# Patient Record
Sex: Male | Born: 1960 | Race: White | Hispanic: No | Marital: Married | Smoking: Former smoker
Health system: Southern US, Community
[De-identification: ages and names within clinical notes are randomized; demographics above are authoritative.]

## PROBLEM LIST (undated history)

## (undated) DIAGNOSIS — Z87891 Personal history of nicotine dependence: Secondary | ICD-10-CM

## (undated) DIAGNOSIS — I10 Essential (primary) hypertension: Secondary | ICD-10-CM

## (undated) DIAGNOSIS — I2510 Atherosclerotic heart disease of native coronary artery without angina pectoris: Secondary | ICD-10-CM

## (undated) DIAGNOSIS — B269 Mumps without complication: Secondary | ICD-10-CM

## (undated) DIAGNOSIS — T7840XA Allergy, unspecified, initial encounter: Secondary | ICD-10-CM

## (undated) DIAGNOSIS — E663 Overweight: Secondary | ICD-10-CM

## (undated) DIAGNOSIS — J45909 Unspecified asthma, uncomplicated: Secondary | ICD-10-CM

## (undated) DIAGNOSIS — F419 Anxiety disorder, unspecified: Secondary | ICD-10-CM

## (undated) DIAGNOSIS — U071 COVID-19: Secondary | ICD-10-CM

## (undated) DIAGNOSIS — N200 Calculus of kidney: Secondary | ICD-10-CM

## (undated) DIAGNOSIS — M25562 Pain in left knee: Secondary | ICD-10-CM

## (undated) DIAGNOSIS — B019 Varicella without complication: Secondary | ICD-10-CM

## (undated) DIAGNOSIS — R1310 Dysphagia, unspecified: Secondary | ICD-10-CM

## (undated) DIAGNOSIS — Z0000 Encounter for general adult medical examination without abnormal findings: Secondary | ICD-10-CM

## (undated) DIAGNOSIS — E785 Hyperlipidemia, unspecified: Secondary | ICD-10-CM

## (undated) DIAGNOSIS — F329 Major depressive disorder, single episode, unspecified: Secondary | ICD-10-CM

## (undated) DIAGNOSIS — F5105 Insomnia due to other mental disorder: Secondary | ICD-10-CM

## (undated) DIAGNOSIS — G4700 Insomnia, unspecified: Secondary | ICD-10-CM

## (undated) DIAGNOSIS — B351 Tinea unguium: Secondary | ICD-10-CM

## (undated) DIAGNOSIS — K219 Gastro-esophageal reflux disease without esophagitis: Secondary | ICD-10-CM

## (undated) HISTORY — DX: Gastro-esophageal reflux disease without esophagitis: K21.9

## (undated) HISTORY — DX: Major depressive disorder, single episode, unspecified: F32.9

## (undated) HISTORY — DX: Anxiety disorder, unspecified: F41.9

## (undated) HISTORY — DX: Allergy, unspecified, initial encounter: T78.40XA

## (undated) HISTORY — DX: Unspecified asthma, uncomplicated: J45.909

## (undated) HISTORY — DX: COVID-19: U07.1

## (undated) HISTORY — DX: Insomnia, unspecified: G47.00

## (undated) HISTORY — DX: Essential (primary) hypertension: I10

## (undated) HISTORY — DX: Hyperlipidemia, unspecified: E78.5

## (undated) HISTORY — DX: Atherosclerotic heart disease of native coronary artery without angina pectoris: I25.10

## (undated) HISTORY — PX: COLONOSCOPY: SHX174

## (undated) HISTORY — DX: Dysphagia, unspecified: R13.10

## (undated) HISTORY — DX: Varicella without complication: B01.9

## (undated) HISTORY — DX: Tinea unguium: B35.1

## (undated) HISTORY — DX: Pain in left knee: M25.562

## (undated) HISTORY — DX: Encounter for general adult medical examination without abnormal findings: Z00.00

## (undated) HISTORY — DX: Overweight: E66.3

## (undated) HISTORY — DX: Personal history of nicotine dependence: Z87.891

## (undated) HISTORY — DX: Mumps without complication: B26.9

## (undated) HISTORY — DX: Insomnia due to other mental disorder: F51.05

## (undated) HISTORY — DX: Calculus of kidney: N20.0

---

## 2005-01-14 ENCOUNTER — Inpatient Hospital Stay (HOSPITAL_COMMUNITY): Admission: EM | Admit: 2005-01-14 | Discharge: 2005-01-16 | Payer: Self-pay | Admitting: *Deleted

## 2005-01-14 ENCOUNTER — Ambulatory Visit: Payer: Self-pay | Admitting: Cardiology

## 2005-01-14 HISTORY — PX: CORONARY STENT PLACEMENT: SHX1402

## 2005-01-14 IMAGING — CR DG CHEST 1V PORT
1 series · 1 of 1 positions shown · non-contrast
Comparison: None.

CLINICAL DATA: Chest pain, short of breath. 
 PORTABLE CHEST ? 1 VIEW:

[view not recorded]
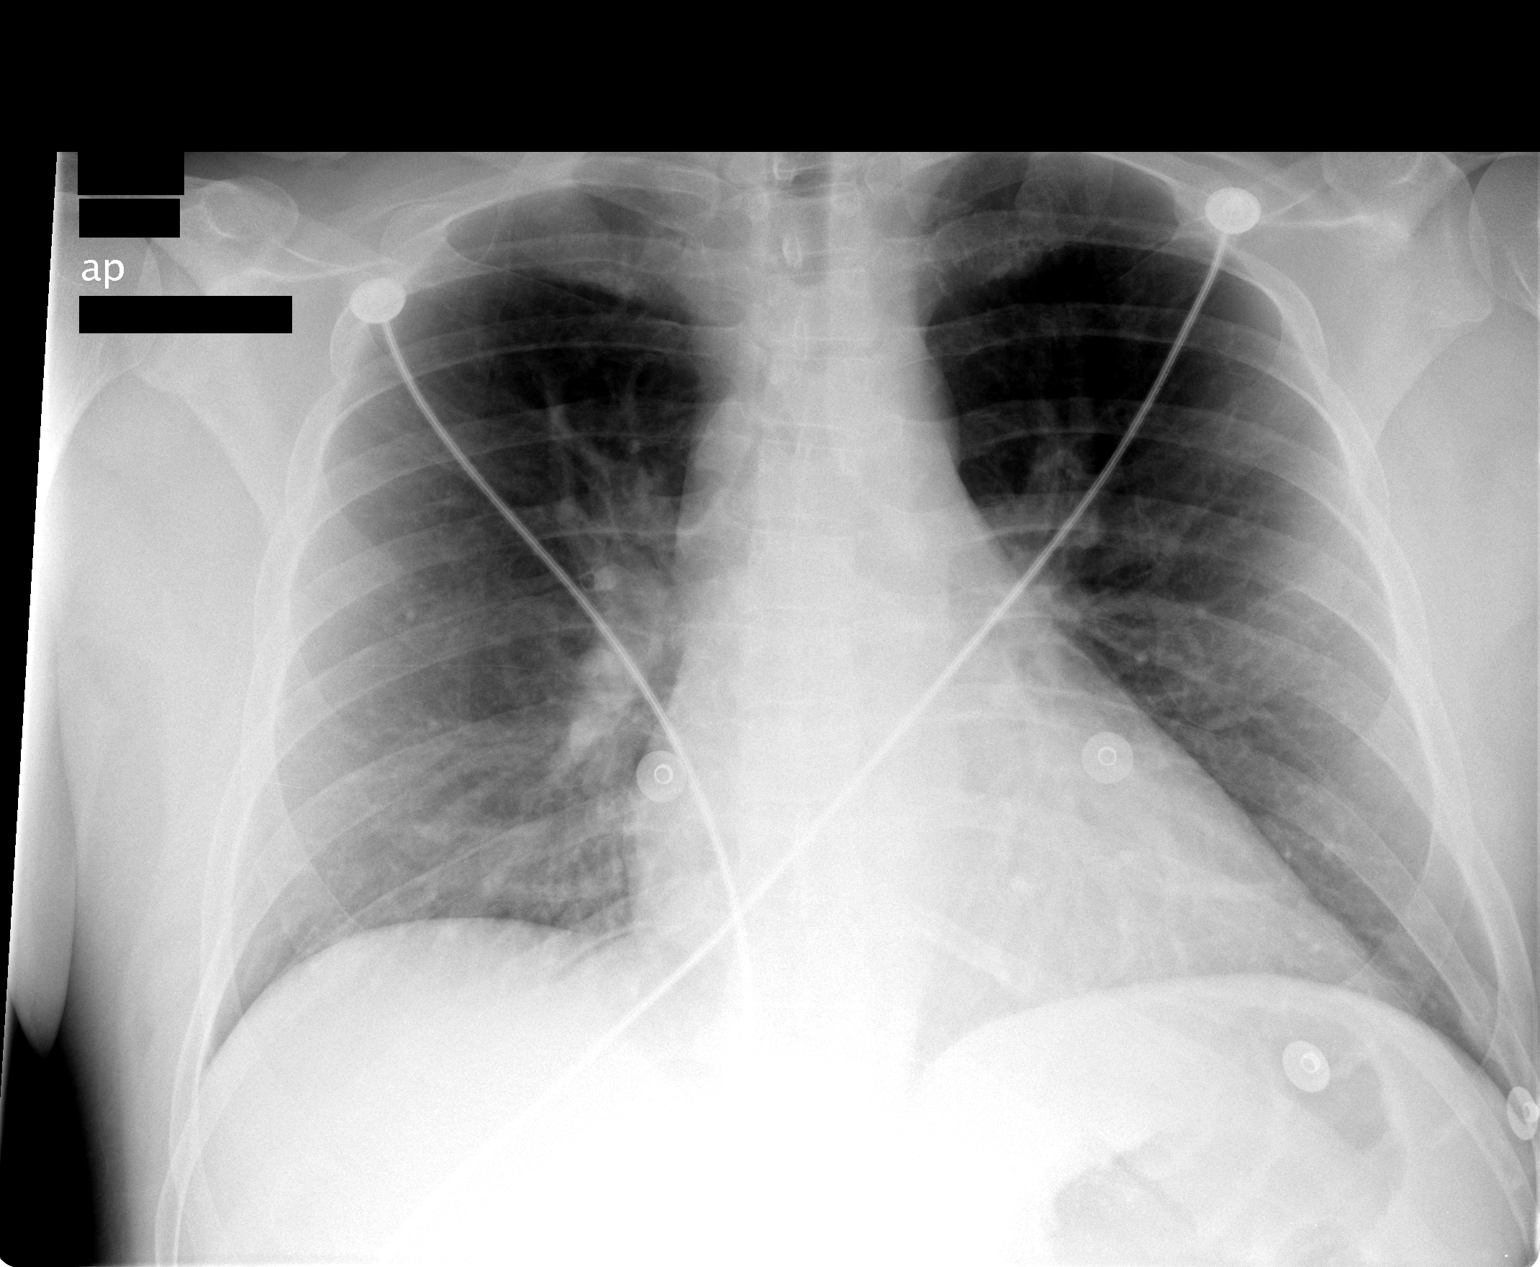

[1 of 1 positions shown; findings below may reference images not displayed]

FINDINGS: Heart within normal limits for a portable chest x-ray.  Question early venous hypertension.  No frank congestive heart failure.  Lungs clear.
IMPRESSION: 1.  Heart upper normal. 
 2.  Question early venous hypertension.

## 2005-01-28 ENCOUNTER — Ambulatory Visit: Payer: Self-pay | Admitting: *Deleted

## 2005-07-17 ENCOUNTER — Ambulatory Visit: Payer: Self-pay | Admitting: Internal Medicine

## 2005-07-20 ENCOUNTER — Ambulatory Visit: Payer: Self-pay | Admitting: Internal Medicine

## 2005-10-26 ENCOUNTER — Ambulatory Visit: Payer: Self-pay | Admitting: Family Medicine

## 2005-10-29 ENCOUNTER — Ambulatory Visit: Payer: Self-pay | Admitting: Family Medicine

## 2005-11-06 ENCOUNTER — Ambulatory Visit: Payer: Self-pay | Admitting: Internal Medicine

## 2006-07-07 ENCOUNTER — Ambulatory Visit: Payer: Self-pay | Admitting: Internal Medicine

## 2006-07-15 ENCOUNTER — Ambulatory Visit: Payer: Self-pay | Admitting: Internal Medicine

## 2006-07-15 LAB — CONVERTED CEMR LAB
AST: 26 units/L (ref 0–37)
Alkaline Phosphatase: 74 units/L (ref 39–117)
CO2: 29 meq/L (ref 19–32)
Chloride: 107 meq/L (ref 96–112)
Creatinine, Ser: 0.9 mg/dL (ref 0.4–1.5)
HDL: 29.5 mg/dL — ABNORMAL LOW (ref >39.0)
Potassium: 4.1 meq/L (ref 3.5–5.1)
Sodium: 141 meq/L (ref 135–145)
Total Bilirubin: 0.8 mg/dL (ref 0.3–1.2)
Total Protein: 6.6 g/dL (ref 6.0–8.3)
Triglycerides: 108 mg/dL (ref 0–149)

## 2007-09-13 ENCOUNTER — Ambulatory Visit: Payer: Self-pay | Admitting: Internal Medicine

## 2007-09-13 LAB — CONVERTED CEMR LAB
Albumin: 4.4 g/dL (ref 3.5–5.2)
Alkaline Phosphatase: 68 units/L (ref 39–117)
BUN: 11 mg/dL (ref 6–23)
CO2: 27 meq/L (ref 19–32)
Chloride: 103 meq/L (ref 96–112)
Cholesterol: 206 mg/dL (ref 0–200)
Creatinine, Ser: 0.9 mg/dL (ref 0.4–1.5)
Direct LDL: 149.4 mg/dL
GFR calc Af Amer: 116 mL/min
GFR calc non Af Amer: 96 mL/min
Glucose, Bld: 99 mg/dL (ref 70–99)
Sodium: 138 meq/L (ref 135–145)
Triglycerides: 177 mg/dL — ABNORMAL HIGH (ref 0–149)

## 2008-11-14 ENCOUNTER — Encounter (INDEPENDENT_AMBULATORY_CARE_PROVIDER_SITE_OTHER): Payer: Self-pay | Admitting: *Deleted

## 2009-07-10 ENCOUNTER — Telehealth: Payer: Self-pay | Admitting: Internal Medicine

## 2009-08-27 DIAGNOSIS — I2510 Atherosclerotic heart disease of native coronary artery without angina pectoris: Secondary | ICD-10-CM | POA: Insufficient documentation

## 2009-08-27 DIAGNOSIS — I10 Essential (primary) hypertension: Secondary | ICD-10-CM

## 2009-08-27 DIAGNOSIS — E782 Mixed hyperlipidemia: Secondary | ICD-10-CM

## 2009-08-28 ENCOUNTER — Ambulatory Visit: Payer: Self-pay | Admitting: Internal Medicine

## 2010-03-04 NOTE — Assessment & Plan Note (Signed)
Summary: REFILL OF MED/MT  Medications Added ASPIRIN 81 MG TABS (ASPIRIN) take one tablet once daily      Allergies Added: NKDA  CC:  pt follow up.  Pt states he feels great and would like to opt out of having an EKG today.  He is self pay and feels he has done well and does not want the added cost.  .  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  He underwent angioplasty of the right coronary arterywith bare-metal stent.  EF was 50%.  He also has history of hypertension, hyperlipidemia, alcohol and tobacco use, depression.   Returns for 2 year f/u.  Says he feel great. 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 No CP or SOB.  Exercises 3-4x week with weightlifting and walking/jogging on trail behind school. Compliant with meds. Still drinking 2-3 large glasses of wine per day. Quit smoking in March.   Current Medications (verified): 1)  Amlodipine Besylate 5 Mg Tabs (Amlodipine Besylate) .... Take One Tablet By Mouth Daily 2)  Lovastatin 40 Mg Tabs (Lovastatin) .... Take One Tablet By Mouth Daily At Bedtime 3)  Aspirin 81 Mg Tabs (Aspirin) .... Take One Tablet Once Daily  Allergies (verified): No Known Drug Allergies  Past History:  Past Medical History: Last updated: 08/27/2009 1. CAD 2. Depression 3. Hyperlipidemia 4. Hypertension  Review of Systems       As per HPI and past medical history; otherwise all systems negative.   Vital Signs:  Patient profile:   50 year old male Height:      73 inches Weight:      206 pounds BMI:     27.28 Pulse rate:   92 / minute Pulse rhythm:   regular BP sitting:   124 / 82  (right arm) Cuff size:   regular  Vitals Entered By: Judithe Modest CMA (August 28, 2009 2:07 PM)  Physical Exam  General:  Gen: well appearing. no resp difficulty HEENT: normal Neck: supple. no JVD. Carotids 2+ bilat; no bruits.  No lymphadenopathy or thryomegaly appreciated. Cor: PMI nondisplaced. Regular rate & rhythm. No rubs, gallops, murmur. Lungs: clear Abdomen: soft, nontender, nondistended. No hepatosplenomegaly. No bruits or masses. Good bowel sounds. Extremities: no cyanosis, clubbing, rash, edema Neuro: alert & orientedx3, cranial nerves grossly intact. moves all 4 extremities w/o difficulty. affect pleasant    Impression & Recommendations:  Problem # 1:  CAD, NATIVE VESSEL (ICD-414.01) Stable. No evidence of ischemia. Continue current regimen.  Problem # 2:  HYPERTENSION, UNSPECIFIED (ICD-401.9) Blood pressure well controlled. Continue current regimen.  Problem # 3:  HYPERLIPIDEMIA-MIXED (ICD-272.4) Goal LDL < 70. Due for f/u lipid and liver panel. Stressed need to be very careful with his alcohol intake in setting of statin.   Problem # 4:  Tobacco use Congratulated him on quitting.   Patient Instructions: 1)  Your physician recommends that you schedule a follow-up appointment in: 24 months with Dr Gala Romney 2)  Your physician recommends that you haver lab work at your primary care MD.  BMP, liver, lipid and CBC  414.01 272.0 401.1 Prescriptions: LOVASTATIN 40 MG TABS (LOVASTATIN) Take one tablet by mouth daily at bedtime  #30 x 11   Entered by:   Charolotte Capuchin, RN   Authorized by:   Dolores Patty, MD, [facility]   Signed by:   Charolotte Capuchin, RN on 08/28/2009  Method used:   Electronically to        [facility] [id number] (retail)       [address]       [place], [place]  [address]       Ph: [phone] or [phone]       Fax: [phone]   RxID:   [id number] AMLODIPINE BESYLATE 5 MG TABS (AMLODIPINE BESYLATE) Take one tablet by mouth daily  #30 Tablet x 11   Entered by:   Charolotte Capuchin, RN   Authorized by:   Dolores Patty, MD, [facility]   Signed by:   Charolotte Capuchin, RN on 08/28/2009   Method used:   Electronically to         [facility] [id number] (retail)       [address]       [place], [place]  [address]       Ph: [phone] or [phone]       Fax: [phone]   RxID:   [id number]

## 2010-03-04 NOTE — Progress Notes (Signed)
Summary: REFILL MEDS   Phone Note Refill Request Call back at Home Phone [phone]   Refills Requested: Medication #1:  AMLODIPINE BESYLATE 5 MG TABS Take one tablet by mouth daily [facility]  Initial call taken by: Glynda Jaeger,  July 10, 2009 11:04 AM Caller: Patient    Prescriptions: AMLODIPINE BESYLATE 5 MG TABS (AMLODIPINE BESYLATE) Take one tablet by mouth daily  #30 Tablet x 2   Entered by:   Hardin Negus, RMA   Authorized by:   Dolores Patty, MD, [facility]   Signed by:   Hardin Negus, RMA on 07/11/2009   Method used:   Electronically to        [facility] [id number] (retail)       [address]       [place], [place]  [address]       Ph: [phone] or [phone]       Fax: [phone]   RxID:   [id number]

## 2010-06-17 NOTE — Assessment & Plan Note (Signed)
[facility]                            CARDIOLOGY OFFICE NOTE   NAME:[name], [name]                       MRN:          [id number]  DATE:09/13/2007                            DOB:          1960-03-17    PRIMARY CARE Dakota [name]:  [facility]  [place]   INTERVAL HISTORY:  Dakota [name] is a 50 year old male with history of coronary  artery disease, status post previous inferior wall myocardial infarction  in December 2006.  He underwent angioplasty of the right coronary artery  with bare-metal stent.  EF was 50%.  He also has history of  hypertension, hyperlipidemia, ongoing alcohol and tobacco use,  depression.   He returns today for yearly followup.  From a cardiac point of view, he  is doing well.  He remains active.  He rides his bike occasionally.  No  chest pain or shortness of breath.  However, he stopped all his  medications as he says they make him feel tired.  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  He is smoking 1-2 packs a day of cigarettes and  has tried everything to quit, but just cannot seem to get it done.  He  is also drinking several glasses of wine a night and/or 3-4 beers is  what he quantifies.   CURRENT MEDICATIONS:  None.   PHYSICAL EXAMINATION:  GENERAL:  He is in no acute distress, ambulatory  on the clinic without any respiratory difficulty.  VITAL SIGNS:  Blood pressure is 134/95, heart rate 83, he weighs 198.  HEENT:  Normal.  NECK:  Supple.  No JVD.  Carotid are 2+ bilaterally without any bruits.  There is no lymphadenopathy or thyromegaly.  CARDIAC:  PMI is nondisplaced.  Regular rate and rhythm with an S4.  No  murmur.  LUNGS:  Clear.  ABDOMEN:  Soft, nontender, and nondistended.  No hepatosplenomegaly, no  bruits, no masses.  Good bowel sounds.  EXTREMITIES:  Warm with no  cyanosis, clubbing, or edema.  No rash.  NEURO:  Alert and  oriented x3.  Cranial nerves II-XII are intact.  Moves  all 4 extremities without difficulty.  Affect is pleasant.   EKG shows sinus rhythm at a rate of 74 with a previous inferior Q waves.   ASSESSMENT AND PLAN:  1. Coronary artery disease.  This is stable.  I have asked him to      resume his aspirin and we will also start him on lovastatin 40.  2. Hypertension.  Blood pressure is elevated.  We will put him on      Norvasc.  He does have a previous history of cocaine use and were      avoiding beta blockers.  We will start Norvasc 5 mg a day today.  3. Hyperlipidemia, as above.  We will start him on lovastatin.  We      will check his lipids today.  I have asked him to follow up with      his  primary care physician.  4. Alcohol and tobacco use.  I have counseled him extensively on the      need to try to find ways to stop.  He really seems to be minimizing      his alcohol use.  I have asked him to follow up with his primary      care physician.   DISPOSITION:  He follow back up with [place] in a year.     Bevelyn Buckles. Bensimhon, MD  Electronically Signed    DRB/MedQ  DD: 09/13/2007  DT: 09/13/2007  Job #: [id number]   cc:   [facility]

## 2010-06-17 NOTE — Assessment & Plan Note (Signed)
[facility]                            CARDIOLOGY OFFICE NOTE   NAME:[name], [name]                       MRN:          [id number]  DATE:07/07/2006                            DOB:          [birth year]    PRIMARY CARE Orel Hord:  [facility] in [place], [place].   INTERVAL HISTORY:  Dakota [name] is a 50 year old male with a history of  coronary artery disease status post inferior wall myocardial infarction  in December 2006, status post PCA and stenting of the right coronary  artery with a bare metal stent. EF is 50%. He also has a history of  hyperlipidemia, ongoing tobacco use, depression, and a history of  polysubstance abuse with ongoing alcohol abuse.   He returns today for routine follow up. From a cardiac point-of-view, he  is doing well. He denies any chest pain or shortness of breath. He has  not had any congestive heart failure symptoms. Unfortunately, he  continues to smoke two packs a day and drink at least a six pack of beer  every other day with a glass or two of wine. He admits to being  depressed and said that about a month ago, he thought about killing  himself by running a hose from his exhaust pipe into his car. He says  that most of his problems extend from having problems with his ex-wife  and other women.   CURRENT MEDICATIONS:  1. Lipitor 80 mg daily.  2. He has stopped his aspirin, Plavix, Cardizem, and Zoloft.   PHYSICAL EXAMINATION:  GENERAL:  He is somewhat jittery, but in no acute  distress. He ambulates around the clinic without any respiratory  difficulty.  VITAL SIGNS:  Blood pressure 144/90, heart rate 81, weight 197.  HEENT:  Normal.  NECK:  Supple. There is no JVD. Carotids are 2+ bilaterally without any  bruits. There is no lymphadenopathy or thyromegaly.  CARDIAC:  PMI is non-displaced. He has a regular rate and rhythm with no  murmurs, rubs, or gallops.  LUNGS:  Clear.  ABDOMEN:  Soft and nontender,  nondistended, no hepatosplenomegaly, no  bruits, no masses.  EXTREMITIES:  Warm with no cyanosis, clubbing, or edema. He does have a  mild rash on his legs and trunk which he attributes to sun exposure.  NEUROLOGIC:  His affect is mildly jittery. Cranial nerves II-XII are  intact. He can use all four extremities without difficulty. He is awake,  alert, and oriented x3.   EKG shows normal sinus rhythm with sinus arrhythmia and left atrial  enlargement at a rate of 81. Small inferior Q-waves. No active STT wave  abnormalities.   ASSESSMENT AND PLAN:  1. Coronary artery disease. This is stable. I have asked him to resume      his aspirin.  2. Hypertension. Blood pressure is elevated. We will restart his      Cardizem. We have been avoiding beta blocker due to his previous      use of cocaine, which he has reportedly has stopped.  3. Hyperlipidemia. He is on Lipitor, however given his  alcohol use, I      am slightly nervous about this. We will have to follow his lipids      and liver panel relatively closely.  4. Depression. I have talked to him about this. He has no active      suicidal or homicidal ideation. We have asked him to follow up in      the [facility] and we have made a referral to them.  5. Alcohol and tobacco use. I counseled him extensively on the need to      find help and stop these behaviors. Hopefully, the [facility] can help him. He is considering hypnosis for his      tobacco use.   DISPOSITION:  We will see him back in clinic in six months to a year for  a routine follow up.     Bevelyn Buckles. Bensimhon, MD  Electronically Signed    DRB/MedQ  DD: 07/07/2006  DT: 07/08/2006  Job #: [id number]

## 2010-06-20 NOTE — Cardiovascular Report (Signed)
NAMERehman, Dakota [name]                ACCOUNT NO.:  [id number]   MEDICAL RECORD NO.:  [id number]          PATIENT TYPE:  INP   LOCATION:  3303                         FACILITY:  [facility]   PHYSICIAN:  Arturo Morton. Riley Kill, M.D. [facility] OF BIRTH:  [birth year]   DATE OF PROCEDURE:  01/14/2005  DATE OF DISCHARGE:                              CARDIAC CATHETERIZATION   INDICATIONS:  The patient is a 50 year old who had onset of chest pain about  eight days ago. He has had persistent chest pain. He was brought to the  catheterization laboratory and underwent diagnostic catheterization by Dr.  Eden Emms. The principal finding was that of a total occlusion of the right  coronary artery with some collateralization. He also had about 40-50% area  of ostial narrowing of the circumflex. There was collateralization of the  distal right coronary with latent collaterals to this area and the patient  had continued ongoing chest pain. As a result, the feeling was that a RCA  should be opened. The patient was on the table in the plans were made to  proceed with an attempt at percutaneous intervention.   Heparin was given according to protocol. ACT was checked and the ACT was  over 200 seconds. Subsequently, additional heparin was administered as well  as double bolus Integrilin. We used a high-torque floppy wire and eventually  with a balloon we were able to get the wire down into the distal vessel.  Reperfusion was achieved. We eventually placed a 20 x 2.75 Liberte stent in  the midportion of the right coronary artery just prior to the acute marginal  branch. The stent was then post dilated using a 3.25 PowerSail balloon.  There was removal of the balloon some retrograde evidence of clot formation  which we attempted to remove and we were successfully able to remove with  the export catheter. There was some distal embolization that occurred into  the distal-most aspect of the posterolateral branch, but without  clinical  consequence, pain, or change in the ST segments. It was in the distal-most  aspect of the posterolateral branch. We tried to put a wire across this area  with some wire thrombolysis. We also gave some intracoronary verapamil.  Since it involved just the distal most aspect of the vessel, we did not  place a balloon out into this area because of its distal location. The  procedure was completed and the femoral sheath sewn into place. He was taken  to the holding area in satisfactory clinical condition.   ANGIOGRAPHIC DATA:  The right coronary is large-caliber vessel. It is  totally occluded just beyond the takeoff of the initial RV branch. Following  reperfusion, the lesion is just prior to the large acute marginal. This area  stented and reduced from 100% down to less than 10% residual narrowing. TIMI  flow is 0 at the beginning of the procedure and III at the completion. There  is excellent runoff into the large acute marginal. There is small PDA system  which then bifurcates into two posterolateral branches. There is evidence of  some distal embolization of  the distal-most factor aspect of the  posterolateral branch, but only the second branch. The first branches are  widely patent with good distal runoff.   CONCLUSION:  Successful percutaneous stenting of the mid-right coronary  artery in the setting of recent acute myocardial infarction.   DISPOSITION:  The patient be treated aspirin and Plavix. He will likely need  beta blockade. We will do a 2-D echocardiogram in the morning.      Arturo Morton. Riley Kill, M.D. [facility]  Electronically Signed     TDS/MEDQ  D:  01/14/2005  T:  01/15/2005  Job:  [id number]   cc:   Arvilla Meres, M.D. [facility]  [facility]  [address]  [place]  [place] [address]   CV Laboratory   Patient's medical record

## 2010-06-20 NOTE — Discharge Summary (Signed)
Dakota [name], Dakota [name]                ACCOUNT NO.:  [id number]   MEDICAL RECORD NO.:  [id number]          PATIENT TYPE:  INP   LOCATION:  3709                         FACILITY:  [facility]   PHYSICIAN:  Arvilla Meres, M.D. LHCDATE OF BIRTH:  [birth year]   DATE OF ADMISSION:  01/14/2005  DATE OF DISCHARGE:  01/16/2005                                 DISCHARGE SUMMARY   PRINCIPAL DIAGNOSIS:  Inferior myocardial infarction.   OTHER DIAGNOSES:  1.  Coronary artery disease.  2.  Polysubstance abuse including tobacco, alcohol and cocaine.  3.  Hyperlipidemia.  4.  Depression.  5.  Gastroesophageal reflux disease.  6.  History of cervical spine spurs.   ALLERGIES:  NEOSPORIN causes rash.   PROCEDURES:  Left heart carotid catheterization with PCI and stenting of the  right coronary artery with bare metal stent.   HISTORY OF PRESENT ILLNESS:  This is a 50 year old white male without prior  history of cardiac disease who has history of polysubstance abuse including  tobacco, alcohol and cocaine.  Approximately eight days prior to  presentation to the [facility] ED, he began to experience epigastric and  chest discomfort which was intermittent over the subsequent week.  Two to  three days prior to admission, he had worsening and more frequent chest pain  and, on the evening of January 13, 2005, he had severe constant chest pain  without associated symptoms.  Of note, he used cocaine approximately one  week ago with onset of symptoms.  The patient presented to the [facility] ED  where his EKG showed sinus rhythm with inferior Q's and without ST or T  abnormalities.  He was subsequently admitted to [facility].   HOSPITAL COURSE:  The patient ruled in for MI with a peak CK of 2996, peak  MB of 291.3.  He was taken to the cardiac cath lab on January 14, 2005  which revealed a normal left main, 30% proximal stenosis of the LAD, 60%  ostial lesion in the left circumflex with left  to right collaterals, and a  total occlusion in the mid-RCA.  His EF was 50% with inferior akinesis.  The  patient was having chest pain on the table and his films were reviewed with  Dr. Riley Kill and he underwent successful PTI and stenting of the mid-RCA with  a 2.75 x 20 mm Liberte bare metal stent.  The patient was monitored in  stepdown post procedure and was counseled on the importance of polysubstance  cessation.  The patient feels that he is not addicted to cocaine, using it  socially about once a week and can stop that easily.  He does not feel that  he has a drinking problem and realizes that quitting smoking will be  difficult.  He has been seen by the smoking cessation team and is willing to  accept the prescription for Wellbutrin.  The patient has been transferred  off of telemetry and has been ambulating in the hallways without recurring  chest discomfort or limitations.  He is being discharged home today in  satisfactory condition.  DISCHARGE LABS:  Hemoglobin 13.6, hematocrit 39.4, WBC 9.1, platelets 215,  MCV 89.1.  Sodium 137, potassium 3.4, chloride 104, CO2 29, BUN 12,  creatinine 0.9, glucose 105.  PT 13.2, INR 1, PTT 33, total bilirubin 1,  alkaline phosphatase 65, AST 60, ALT 26, amylase 23, lipase 14, albumin 3.8,  peak CK 2996, peak MB 291.3, total cholesterol 199, triglycerides 177, HDL  35, LDL 129, calcium 8.5, D-dimer was negative.  Free T4 0.92.  Urine drug  screen was positive for cocaine metabolites.   DISPOSITION:  The patient is being discharged home today in good condition.  He has a follow-up with Dr. Gala Romney with nurse practitioner at [facility]  Cardiology [facility] on January 28, 2005 at 12:30 p.m.  He is also asked to  follow up with his primary care physician located at the [facility] within the next 3-4 weeks.   DISCHARGE MEDICATIONS:  1.  Aspirin 81 mg q.d.  2.  Plavix 75 mg q.d.  3.  Diltiazem CD 120 mg q.d.  4.  Lipitor 80 mg  q.h.s.  5.  Nitroglycerin 0.4 mg sublingual p.r.n. chest pain.  6.  Prilosec 20 mg q.d.  7.  Zoloft as previously prescribed.   OUTSTANDING LAB STUDIES:  None.   DURATION DISCHARGE ENCOUNTER:  45 minutes including physician time.      Ok Anis, NP      Arvilla Meres, M.D. [facility]  Electronically Signed    CRB/MEDQ  D:  01/16/2005  T:  01/19/2005  Job:  [id number]   cc:   [facility]

## 2010-06-20 NOTE — Cardiovascular Report (Signed)
NAMEAVIEN, TAHA                ACCOUNT NO.:  [id number]   MEDICAL RECORD NO.:  [id number]          PATIENT TYPE:  INP   LOCATION:  3303                         FACILITY:  [facility]   PHYSICIAN:  Charlton Haws, M.D.     DATE OF BIRTH:  17-Aug-1960   DATE OF PROCEDURE:  01/14/2005  DATE OF DISCHARGE:                              CARDIAC CATHETERIZATION   Coronary arteriography.   INDICATIONS:  Question acute myocardial infarction with eight days chest  pain with EKG changes.   Cine catheterization was done with a 6-French catheter from the right  femoral artery.   There is a question of the ostial circumflex lesion and a JL-5 catheter was  used to try to selectively engage the circumflex.   Left main coronary artery was normal.   Left anterior descending artery had 30% multi-discrete lesion in the  proximal and mid-vessel.   Circumflex coronary artery was nondominant but a large vessel. There was a  60% tubular lesion in the ostium.   There were left-to-right collaterals to the RCA.   Right coronary artery was dominant and was 100% midvessel occlusion after  the takeoff of a medium-sized RV branch.   RAO VENTRICULOGRAPHY:  RAO ventriculography showed inferobasal wall  akinesis. EF was 50%. There was no MR. Aortic pressure was in the 110/87  range, LV pressure was in the 115/26 range.   IMPRESSION:  The patient is still having pain and has collaterals to the  right. I will review the films with Dr. Riley Kill but I suspect he will want  to proceed with angioplasty of native right coronary artery. The patient  will probably need an Adenosine-Myoview 6 to 8 weeks post myocardial  infarction to further assess the circumflex lesion.           ______________________________  Charlton Haws, M.D.     PN/MEDQ  D:  01/14/2005  T:  01/14/2005  Job:  [id number]

## 2010-06-20 NOTE — H&P (Signed)
[name], [name]                ACCOUNT NO.:  [id number]   MEDICAL RECORD NO.:  [id number]          PATIENT TYPE:  EMS   LOCATION:  MAJO                         FACILITY:  [facility]   PHYSICIAN:  Arvilla Meres, M.D. LHCDATE OF BIRTH:  09/23/1960   DATE OF ADMISSION:  01/14/2005  DATE OF DISCHARGE:                                HISTORY & PHYSICAL   PRIMARY CARE PHYSICIAN:  [facility], [place], [place].  He is new to [facility].   CHIEF COMPLAINT:  Chest pain.   HISTORY OF PRESENT ILLNESS:  Mr. Dakota [name] is a 50 year old male without  previous cardiac history.  He has never previously had a cardiac  catheterization.  He does have a history of poly-substance abuse, including  tobacco, alcohol and cocaine use.  He also has borderline hyperlipidemia.  Approximately 10 days ago he began having some stomach pain, for which he  started on Prilosec.  He got some relief with this.  Over the past two to  three days, he has had multiple episodes of chest pain.  Overnight the chest  pain became very severe and constant.  He describes it as a very hard pain  in the middle of his chest.  There are no associated factors.  He denies any  shortness of breath, diaphoresis, nausea, vomiting or radiation.  It is not  worse with exertion.  It is not positional.  It is not worse with food.  He  has not had any bright red blood per rectum or melena.  There has been no  hematemesis.   REVIEW OF SYSTEMS:  Per the HPI and problem list, otherwise all systems  negative.   PAST MEDICAL HISTORY:  1.  Hyperlipidemia, borderline.  2.  Depression.  3.  Cervical spine disease.   CURRENT MEDICATIONS:  1.  Zoloft.  2.  Prilosec.  3.  Aspirin p.r.n.   ALLERGIES:  NEOSPORIN.   SOCIAL HISTORY:  He recently moved from [place] to [place] about six  months ago.  He was a Technical sales engineer.  He lives with his girlfriend.  He has a history of tobacco of one pack per day for  many years.  Alcohol:  Drinks several beers a day, but cannot quantify.  He does have a history of  DWI about 20 years ago.  Endorses cocaine use, but says the last use was  over one week ago.   FAMILY HISTORY:  Mom died in her 40's due to multiple sclerosis.  Father  died in his 93's due to brain cancer.  He has two sisters in their upper  85's, who are alive and well.   PHYSICAL EXAMINATION:  GENERAL:  He has somewhat of an odd affect.  He  appears anxious.  He is complaining of chest pain.  VITAL SIGNS:  His respirations are unlabored.  Blood pressure 125/26, heart  rate 82.  Saturation 99% on room air.  He is afebrile.  HEENT:  Normocephalic and atraumatic.  EOMI.  Sclerae anicteric.  There are  no xanthelasma.  Mucous membranes moist.  NECK:  Supple,  no jugular venous distention.  Carotids are 2+ bilaterally.  No bruits.  No lymphadenopathy or thyromegaly.  HEART:  He has a regular rate and rhythm with no obvious murmurs, rubs or  gallops.  LUNGS:  Clear to auscultation.  CHEST:  There is no pain on palpation of his chest wall.  ABDOMEN:  Soft, nontender, non-distended.  There is no hepatosplenomegaly.  No bruits, no masses.  He has good bowel sounds.  EXTREMITIES:  Warm, with no clubbing, cyanosis or edema.  Femoral pulses 2+  bilaterally.  There is a soft bruit in the right groin.  DP pulses 2+  bilaterally.  NEUROLOGIC:  He is alert and oriented x3, as I said, with an odd affect.  Cranial nerves II-XII  intact.  He moves all four extremities without any  difficulty.   A chest x-ray is pending.   An electrocardiogram shows a normal sinus rhythm with a rate of 83.  There  are no ST-T wave changes.   LABORATORY DATA:  White count 13.6, hemoglobin 13.7, platelets 247.  Sodium  127, potassium 4, BUN 9, creatinine 0.6.  Point of care markers are still  pending.   ASSESSMENT/PLAN:  Chest pain:  This is fairly atypical, but quite severe.  Given his multiple cardiac risk  factors, I do have some concern for an acute  coronary syndrome, though there is no objective evidence of ischemia.  I do  think it is reasonable to proceed with a cardiac catheterization, to clearly  define his coronary anatomy.  Also in the differential diagnosis is aortic  dissection, but his pulses are fairly symmetric.  We have a chest x-ray  pending.  For now we will treat him with nitroglycerin and narcotics, as  well as aspirin.  I have explained the risks and benefits of catheterization  to both him and to his girlfriend.  They have agreed to proceed.  Will check  a drug screen as well as an alcohol level, and he will need delirium tremens  prophylaxis, so he does not withdraw from alcohol.      Arvilla Meres, M.D. [facility]  Electronically Signed     DB/MEDQ  D:  01/14/2005  T:  01/14/2005  Job:  [id number]

## 2010-12-04 ENCOUNTER — Other Ambulatory Visit: Payer: Self-pay | Admitting: Internal Medicine

## 2011-01-07 ENCOUNTER — Telehealth: Payer: Self-pay | Admitting: Internal Medicine

## 2011-01-07 NOTE — Telephone Encounter (Signed)
New Msg: Pt calling to schedule appt with Dr. Gala Romney to get refills. Please return pt call to advice if Dr. Gala Romney wants to see pt in [facility] OFF, [facility], or transfer pt care to another cardiologists.

## 2011-01-08 NOTE — Telephone Encounter (Signed)
Checking with Dr Gala Romney

## 2011-01-20 NOTE — Telephone Encounter (Signed)
Pt scheduled with another cardiologist.

## 2011-01-23 ENCOUNTER — Other Ambulatory Visit: Payer: Self-pay | Admitting: Internal Medicine

## 2011-01-23 ENCOUNTER — Other Ambulatory Visit: Payer: Self-pay

## 2011-01-23 MED ORDER — AMLODIPINE BESYLATE 5 MG PO TABS: 5.0000 mg | ORAL_TABLET | Freq: Every day | ORAL | Status: DC

## 2011-02-11 ENCOUNTER — Other Ambulatory Visit: Payer: Self-pay | Admitting: Cardiology

## 2011-02-11 ENCOUNTER — Encounter: Payer: Self-pay | Admitting: Cardiology

## 2011-02-11 ENCOUNTER — Ambulatory Visit (INDEPENDENT_AMBULATORY_CARE_PROVIDER_SITE_OTHER): Payer: Self-pay | Admitting: Cardiology

## 2011-02-11 VITALS — BP 132/82 | HR 74 | Ht 72.0 in | Wt 201.0 lb

## 2011-02-11 DIAGNOSIS — I2510 Atherosclerotic heart disease of native coronary artery without angina pectoris: Secondary | ICD-10-CM

## 2011-02-11 DIAGNOSIS — F17200 Nicotine dependence, unspecified, uncomplicated: Secondary | ICD-10-CM

## 2011-02-11 DIAGNOSIS — Z720 Tobacco use: Secondary | ICD-10-CM

## 2011-02-11 DIAGNOSIS — Z87891 Personal history of nicotine dependence: Secondary | ICD-10-CM | POA: Insufficient documentation

## 2011-02-11 HISTORY — DX: Personal history of nicotine dependence: Z87.891

## 2011-02-11 LAB — HEPATIC FUNCTION PANEL
Bilirubin, Direct: 0.1 mg/dL (ref 0.0–0.3)
Indirect Bilirubin: 0.5 mg/dL (ref 0.0–0.9)

## 2011-02-11 NOTE — Assessment & Plan Note (Signed)
Blood pressure controlled. Continue present medications. 

## 2011-02-11 NOTE — Progress Notes (Signed)
   HPI: Pleasant male previously followed by Dr Gala Romney for fu of coronary artery disease. 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   Cath revealed normal LM. Left anterior descending artery had 30% lesion in the proximal and mid-vessel. Lcx with 60 % lesion. There were left-to-right collaterals to the RCA. Right coronary artery was dominant and was 100% midvessel occlusion after the takeoff of a medium-sized RV branch; inferobasal wall akinesis. EF was 50%. He underwent angioplasty of the right coronary arterywith bare-metal stent.  EF was 50%.  Last seen in July of 2011. Since then, the patient denies any dyspnea on exertion, orthopnea, PND, pedal edema, palpitations, syncope or chest pain.   Current Outpatient Prescriptions  Medication Sig Dispense Refill  . amLODipine (NORVASC) 5 MG tablet Take 1 tablet (5 mg total) by mouth daily.  30 tablet  3  . aspirin 81 MG tablet Take 160 mg by mouth daily.      Marland Kitchen atorvastatin (LIPITOR) 40 MG tablet Take 40 mg by mouth daily.      . Multiple Vitamin (MULTIVITAMIN) capsule Take 1 capsule by mouth daily.      . varenicline (CHANTIX) 1 MG tablet Take 1 mg by mouth 2 (two) times daily.         Past Medical History  Diagnosis Date  . CAD (coronary artery disease)   . Hypertension   . Hyperlipidemia   . Depression     No past surgical history on file.  History   Social History  . Marital Status: Legally Separated    Spouse Name: N/A    Number of Children: N/A  . Years of Education: N/A   Occupational History  . Not on file.   Social History Main Topics  . Smoking status: Former Games developer  . Smokeless tobacco: Not on file  . Alcohol Use: Not on file  . Drug Use: Not on file  . Sexually Active: Not on file   Other Topics Concern  . Not on file   Social History Narrative  . No narrative on file    ROS: Some foot pain but no fevers or chills, productive cough, hemoptysis, dysphasia, odynophagia, melena,  hematochezia, dysuria, hematuria, rash, seizure activity, orthopnea, PND, pedal edema, claudication. Remaining systems are negative.  Physical Exam: Well-developed well-nourished in no acute distress.  Skin is warm and dry.  HEENT is normal.  Neck is supple. No thyromegaly.  Chest is clear to auscultation with normal expansion.  Cardiovascular exam is regular rate and rhythm.  Abdominal exam nontender or distended. No masses palpated. Extremities show no edema. neuro grossly intact  ECG normal sinus rhythm at a rate of 74. No ST changes.

## 2011-02-11 NOTE — Patient Instructions (Signed)
Your physician wants you to follow-up in: one year You will receive a reminder letter in the mail two months in advance. If you don't receive a letter, please call our office to schedule the follow-up appointment.   Your physician has requested that you have a stress echocardiogram. For further information please visit [email or web address] Please follow instruction sheet as given.

## 2011-02-11 NOTE — Assessment & Plan Note (Signed)
Continue statin. Check lipids and liver. 

## 2011-02-11 NOTE — Assessment & Plan Note (Signed)
Continue aspirin and statin. I will arrange a stress echocardiogram for risk stratification.

## 2011-02-11 NOTE — Assessment & Plan Note (Signed)
Patient counseled on discontinuing. 

## 2011-02-12 LAB — LIPID PANEL
HDL: 39 mg/dL — ABNORMAL LOW (ref >39)
LDL Cholesterol: 145 mg/dL — ABNORMAL HIGH (ref 0–99)
Triglycerides: 132 mg/dL (ref <150)
VLDL: 26 mg/dL (ref 0–40)

## 2011-02-19 ENCOUNTER — Telehealth: Payer: Self-pay | Admitting: *Deleted

## 2011-02-19 DIAGNOSIS — E7800 Pure hypercholesterolemia, unspecified: Secondary | ICD-10-CM

## 2011-02-19 DIAGNOSIS — Z79899 Other long term (current) drug therapy: Secondary | ICD-10-CM

## 2011-02-19 MED ORDER — PRAVASTATIN SODIUM 40 MG PO TABS: 40.0000 mg | ORAL_TABLET | Freq: Every evening | ORAL | Status: DC

## 2011-02-19 NOTE — Telephone Encounter (Signed)
Message copied by Freddi Starr on Thu Feb 19, 2011 12:19 PM ------      Message from: [name]      Created: Thu Feb 12, 2011  5:10 PM       Dc lovastatin and begin pravachol 40 mg po daily      Lipids and liver in six weeks      Dakota [name]

## 2011-02-19 NOTE — Telephone Encounter (Signed)
Spoke with pt, aware of med change. Labs will be checked the end of feb.

## 2011-03-09 ENCOUNTER — Ambulatory Visit (HOSPITAL_COMMUNITY): Payer: Managed Care, Other (non HMO) | Attending: Cardiology | Admitting: Radiology

## 2011-03-09 ENCOUNTER — Other Ambulatory Visit (HOSPITAL_COMMUNITY): Payer: Self-pay | Admitting: Cardiology

## 2011-03-09 DIAGNOSIS — I10 Essential (primary) hypertension: Secondary | ICD-10-CM | POA: Insufficient documentation

## 2011-03-09 DIAGNOSIS — I2510 Atherosclerotic heart disease of native coronary artery without angina pectoris: Secondary | ICD-10-CM | POA: Insufficient documentation

## 2011-03-09 DIAGNOSIS — E785 Hyperlipidemia, unspecified: Secondary | ICD-10-CM | POA: Insufficient documentation

## 2011-03-09 DIAGNOSIS — I252 Old myocardial infarction: Secondary | ICD-10-CM | POA: Insufficient documentation

## 2011-03-09 DIAGNOSIS — R0989 Other specified symptoms and signs involving the circulatory and respiratory systems: Secondary | ICD-10-CM | POA: Insufficient documentation

## 2011-03-09 DIAGNOSIS — R0609 Other forms of dyspnea: Secondary | ICD-10-CM | POA: Insufficient documentation

## 2011-03-09 MED ORDER — PERFLUTREN PROTEIN A MICROSPH IV SUSP
3.0000 mL | Freq: Once | INTRAVENOUS | Status: AC
  Administered 2011-03-09: 3 mL via INTRAVENOUS

## 2011-04-02 ENCOUNTER — Other Ambulatory Visit: Payer: Self-pay

## 2011-05-06 ENCOUNTER — Encounter: Payer: Self-pay | Admitting: *Deleted

## 2011-06-10 ENCOUNTER — Telehealth: Payer: Self-pay | Admitting: Cardiology

## 2011-06-10 NOTE — Telephone Encounter (Signed)
Spoke with pt, lab orders mailed to pt home address.

## 2011-06-10 NOTE — Telephone Encounter (Signed)
Patient called regarding setting up labs in the [facility] office, wanted to speak with you regarding the matter.  He can be reached at [phone]

## 2011-06-12 ENCOUNTER — Other Ambulatory Visit: Payer: Self-pay | Admitting: *Deleted

## 2011-06-12 MED ORDER — AMLODIPINE BESYLATE 5 MG PO TABS: 5.0000 mg | ORAL_TABLET | Freq: Every day | ORAL | Status: DC

## 2011-07-15 ENCOUNTER — Encounter: Payer: Self-pay | Admitting: Cardiology

## 2011-07-30 ENCOUNTER — Telehealth: Payer: Self-pay | Admitting: *Deleted

## 2011-07-30 DIAGNOSIS — I6529 Occlusion and stenosis of unspecified carotid artery: Secondary | ICD-10-CM

## 2011-07-30 NOTE — Telephone Encounter (Signed)
Spoke with pt, we received a fax from [facility], there was faint left carotid calcification on the cervical spine series and a carotid doppler was recommended. Dopplers scheduled

## 2011-08-18 ENCOUNTER — Encounter: Payer: Self-pay | Admitting: Cardiology

## 2011-08-19 ENCOUNTER — Encounter (INDEPENDENT_AMBULATORY_CARE_PROVIDER_SITE_OTHER): Payer: Managed Care, Other (non HMO)

## 2011-08-19 DIAGNOSIS — I6529 Occlusion and stenosis of unspecified carotid artery: Secondary | ICD-10-CM

## 2011-09-03 ENCOUNTER — Telehealth: Payer: Self-pay | Admitting: Cardiology

## 2011-09-03 NOTE — Telephone Encounter (Signed)
Spoke with pt, aware I have not seen labs but will call and get those.

## 2011-09-03 NOTE — Telephone Encounter (Signed)
Pt calling to see if labs from [facility] has been sent here yet from a couple weeks  Ago , would like results

## 2011-09-14 ENCOUNTER — Telehealth: Payer: Self-pay | Admitting: Cardiology

## 2011-09-14 NOTE — Telephone Encounter (Signed)
Spoke with patient and advised labs in system but not reviewed by Dr Jens Som yet.  Faxed patient a copy at his request.  Will forward to Dr Jens Som for review. Patient is aware Dr Jens Som out of the office until next week

## 2011-09-14 NOTE — Telephone Encounter (Signed)
New msg Pt wants to know lab results from July 16 in [place]  by [facility] when he saw Dr Jens Som. He said he still hasnt received results and [facility] is billing him. He is upset. Please call

## 2011-09-14 NOTE — Telephone Encounter (Signed)
LDL higher than goal; if cost not a problem, dc pravachol and begin lipitor 80 mg po daily with lipids and liver in six weeks. Olga Millers

## 2011-09-16 NOTE — Telephone Encounter (Signed)
Continue pravachol Olga Millers

## 2011-09-16 NOTE — Telephone Encounter (Signed)
Spoke with patient regarding changing to Lipitor and he stated he had tried this in the past.  He had problems with muscle pain and asked to be changed to something different.  Pravachol is the 3rd medication he has tried and this not causing any problems.  He stated he didn't remember the other medication.  Patient would like to work harder on diet to try to get it down and recheck in 6 months.  Will forward to Dr Jens Som for review

## 2011-09-16 NOTE — Telephone Encounter (Signed)
Left message to call back  

## 2011-09-18 NOTE — Telephone Encounter (Signed)
F/u  ° °Patient returning nurse call, he can be reached at hm#  °

## 2011-09-18 NOTE — Telephone Encounter (Signed)
Called patient. He is aware that it is OK with Dr.Crenshaw for him to stay on Pravachol. He will increase activity and watch diet more carefully. Will see him in clinic in January 2014 and set up next lab at that visit.

## 2012-04-11 ENCOUNTER — Other Ambulatory Visit: Payer: Self-pay | Admitting: Cardiology

## 2012-05-25 ENCOUNTER — Other Ambulatory Visit: Payer: Self-pay | Admitting: Cardiology

## 2012-07-27 ENCOUNTER — Other Ambulatory Visit: Payer: Self-pay | Admitting: Cardiology

## 2012-10-13 ENCOUNTER — Other Ambulatory Visit: Payer: Self-pay | Admitting: Cardiology

## 2012-11-15 ENCOUNTER — Other Ambulatory Visit: Payer: Self-pay | Admitting: Cardiology

## 2012-12-14 ENCOUNTER — Encounter: Payer: Self-pay | Admitting: Cardiology

## 2012-12-14 ENCOUNTER — Ambulatory Visit (INDEPENDENT_AMBULATORY_CARE_PROVIDER_SITE_OTHER): Payer: Managed Care, Other (non HMO) | Admitting: Cardiology

## 2012-12-14 VITALS — BP 120/80 | HR 77 | Ht 72.0 in | Wt 201.0 lb

## 2012-12-14 DIAGNOSIS — I2510 Atherosclerotic heart disease of native coronary artery without angina pectoris: Secondary | ICD-10-CM

## 2012-12-14 DIAGNOSIS — E785 Hyperlipidemia, unspecified: Secondary | ICD-10-CM

## 2012-12-14 MED ORDER — PRAVASTATIN SODIUM 80 MG PO TABS: ORAL_TABLET | ORAL | Status: DC

## 2012-12-14 MED ORDER — AMLODIPINE BESYLATE 5 MG PO TABS: 5.0000 mg | ORAL_TABLET | Freq: Every day | ORAL | Status: DC

## 2012-12-14 NOTE — Progress Notes (Signed)
      HPI: FU coronary artery disease. 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 Cath revealed normal LM. Left anterior descending artery had 30% lesion in the proximal and mid-vessel. Lcx with 60 % lesion. There were left-to-right collaterals to the RCA. Right coronary artery was dominant and was 100% midvessel occlusion after the takeoff of a medium-sized RV branch; inferobasal wall akinesis. EF was 50%. He underwent PCI of the right coronary artery with bare-metal stent. EF was 50%. Stress echocardiogram in February 2013 was normal. Carotid Dopplers in July of 2013 showed 40-59% right and 0-39% left stenosis. Followup recommended in one year. Last seen in Jan 2013. Since then, the patient denies any dyspnea on exertion, orthopnea, PND, pedal edema, palpitations, syncope or chest pain.   Current Outpatient Prescriptions  Medication Sig Dispense Refill  . amLODipine (NORVASC) 5 MG tablet TAKE 1 TABLET BY MOUTH EVERY DAY.*NEED OFFICE VISIT FOR MORE REFILL*  30 tablet  0  . aspirin 81 MG tablet Take 160 mg by mouth daily.      . Multiple Vitamin (MULTIVITAMIN) capsule Take 1 capsule by mouth daily.      . pravastatin (PRAVACHOL) 40 MG tablet TAKE ONE TABLET BY MOUTH IN THE EVENING  30 tablet  0   No current facility-administered medications for this visit.     Past Medical History  Diagnosis Date  . CAD (coronary artery disease)   . Hypertension   . Hyperlipidemia   . Depression     History reviewed. No pertinent past surgical history.  History   Social History  . Marital Status: Legally Separated    Spouse Name: N/A    Number of Children: N/A  . Years of Education: N/A   Occupational History  . Not on file.   Social History Main Topics  . Smoking status: Former Games developer  . Smokeless tobacco: Not on file  . Alcohol Use: Not on file  . Drug Use: Not on file  . Sexual Activity: Not on file   Other Topics Concern  . Not on file   Social History  Narrative  . No narrative on file    ROS: no fevers or chills, productive cough, hemoptysis, dysphasia, odynophagia, melena, hematochezia, dysuria, hematuria, rash, seizure activity, orthopnea, PND, pedal edema, claudication. Remaining systems are negative.  Physical Exam: Well-developed well-nourished in no acute distress.  Skin is warm and dry.  HEENT is normal.  Neck is supple.  Chest is clear to auscultation with normal expansion.  Cardiovascular exam is regular rate and rhythm.  Abdominal exam nontender or distended. No masses palpated. Extremities show no edema. neuro grossly intact  ECG sinus rhythm at a rate of 77. No ST changes.

## 2012-12-14 NOTE — Assessment & Plan Note (Signed)
Continue aspirin and statin. 

## 2012-12-14 NOTE — Assessment & Plan Note (Signed)
Recent LDL 122. He has had intolerance to Lipitor previously; cannot afford crestor. Increase Pravachol to 80 mg daily. Check lipids and liver in 6 weeks.

## 2012-12-14 NOTE — Assessment & Plan Note (Signed)
Patient counseled on discontinuing. 

## 2012-12-14 NOTE — Assessment & Plan Note (Signed)
Blood pressure controlled. Continue present medications. Patient has requested a primary care physician and I asked him to schedule an appointment with Dr. Abner Greenspan.

## 2012-12-14 NOTE — Patient Instructions (Signed)
Your physician has recommended you make the following change in your medication:  INCREASE PRAVACHOL ( 80 MG ) DAILY  SCRIPT WAS SENT IN TODAY TO YOUR PHARMACY ALSO, I SENT IN AMLODIPINE   Your physician recommends that you return for lab work ON Monday December 22 AFTER YOUR CAROTID TEST   Your physician has requested that you have a carotid duplex. This test is an ultrasound of the carotid arteries in your neck. It looks at blood flow through these arteries that supply the brain with blood. Allow one hour for this exam. There are no restrictions or special instructions.YOU ARE SCHEDULED ON Monday, December 22 @ 11:00 AM  You have been referred to DR. BLYTH  SOMEONE WILL CALL FROM OUR OFFICE TO SCHEDULE YOU AN APPOINMENT

## 2012-12-22 ENCOUNTER — Ambulatory Visit (INDEPENDENT_AMBULATORY_CARE_PROVIDER_SITE_OTHER): Payer: Managed Care, Other (non HMO) | Admitting: Family Medicine

## 2012-12-22 ENCOUNTER — Encounter: Payer: Self-pay | Admitting: Family Medicine

## 2012-12-22 VITALS — BP 110/74 | HR 82 | Temp 98.0°F | Ht 72.0 in | Wt 203.1 lb

## 2012-12-22 DIAGNOSIS — M25569 Pain in unspecified knee: Secondary | ICD-10-CM

## 2012-12-22 DIAGNOSIS — Z5189 Encounter for other specified aftercare: Secondary | ICD-10-CM

## 2012-12-22 DIAGNOSIS — J45909 Unspecified asthma, uncomplicated: Secondary | ICD-10-CM | POA: Insufficient documentation

## 2012-12-22 DIAGNOSIS — I2510 Atherosclerotic heart disease of native coronary artery without angina pectoris: Secondary | ICD-10-CM

## 2012-12-22 DIAGNOSIS — Z720 Tobacco use: Secondary | ICD-10-CM

## 2012-12-22 DIAGNOSIS — T7840XD Allergy, unspecified, subsequent encounter: Secondary | ICD-10-CM

## 2012-12-22 DIAGNOSIS — I10 Essential (primary) hypertension: Secondary | ICD-10-CM

## 2012-12-22 DIAGNOSIS — M25562 Pain in left knee: Secondary | ICD-10-CM

## 2012-12-22 DIAGNOSIS — T7840XA Allergy, unspecified, initial encounter: Secondary | ICD-10-CM | POA: Insufficient documentation

## 2012-12-22 DIAGNOSIS — F329 Major depressive disorder, single episode, unspecified: Secondary | ICD-10-CM

## 2012-12-22 DIAGNOSIS — E785 Hyperlipidemia, unspecified: Secondary | ICD-10-CM

## 2012-12-22 DIAGNOSIS — G4700 Insomnia, unspecified: Secondary | ICD-10-CM

## 2012-12-22 DIAGNOSIS — F17200 Nicotine dependence, unspecified, uncomplicated: Secondary | ICD-10-CM

## 2012-12-22 MED ORDER — BUPROPION HCL ER (XL) 300 MG PO TB24: 300.0000 mg | ORAL_TABLET | Freq: Every day | ORAL | Status: DC

## 2012-12-22 MED ORDER — ZOLPIDEM TARTRATE 10 MG PO TABS: 10.0000 mg | ORAL_TABLET | Freq: Every evening | ORAL | Status: DC | PRN

## 2012-12-22 MED ORDER — BUPROPION HCL ER (XL) 150 MG PO TB24: 150.0000 mg | ORAL_TABLET | Freq: Every day | ORAL | Status: DC

## 2012-12-22 NOTE — Progress Notes (Signed)
Pre visit review using our clinic review tool, if applicable. No additional management support is needed unless otherwise documented below in the visit note. 

## 2012-12-22 NOTE — Patient Instructions (Signed)
 Preventive Care for Adults, Male A healthy lifestyle and preventive care can promote health and wellness. Preventive health guidelines for men include the following key practices:  A routine yearly physical is a good way to check with your caregiver about your health and preventative screening. It is a chance to share any concerns and updates on your health, and to receive a thorough exam.  Visit your dentist for a routine exam and preventative care every 6 months. Brush your teeth twice a day and floss once a day. Good oral hygiene prevents tooth decay and gum disease.  The frequency of eye exams is based on your age, health, family medical history, use of contact lenses, and other factors. Follow your caregiver's recommendations for frequency of eye exams.  Eat a healthy diet. Foods like vegetables, fruits, whole grains, low-fat dairy products, and lean protein foods contain the nutrients you need without too many calories. Decrease your intake of foods high in solid fats, added sugars, and salt. Eat the right amount of calories for you.Get information about a proper diet from your caregiver, if necessary.  Regular physical exercise is one of the most important things you can do for your health. Most adults should get at least 150 minutes of moderate-intensity exercise (any activity that increases your heart rate and causes you to sweat) each week. In addition, most adults need muscle-strengthening exercises on 2 or more days a week.  Maintain a healthy weight. The body mass index (BMI) is a screening tool to identify possible weight problems. It provides an estimate of body fat based on height and weight. Your caregiver can help determine your BMI, and can help you achieve or maintain a healthy weight.For adults 20 years and older:  A BMI below 18.5 is considered underweight.  A BMI of 18.5 to 24.9 is normal.  A BMI of 25 to 29.9 is considered overweight.  A BMI of 30 and above is  considered obese.  Maintain normal blood lipids and cholesterol levels by exercising and minimizing your intake of saturated fat. Eat a balanced diet with plenty of fruit and vegetables. Blood tests for lipids and cholesterol should begin at age 20 and be repeated every 5 years. If your lipid or cholesterol levels are high, you are over 50, or you are a high risk for heart disease, you may need your cholesterol levels checked more frequently.Ongoing high lipid and cholesterol levels should be treated with medicines if diet and exercise are not effective.  If you smoke, find out from your caregiver how to quit. If you do not use tobacco, do not start.  Lung cancer screening is recommended for adults aged 55 80 years who are at high risk for developing lung cancer because of a history of smoking. Yearly low-dose computed tomography (CT) is recommended for people who have at least a 30-pack-year history of smoking and are a current smoker or have quit within the past 15 years. A pack year of smoking is smoking an average of 1 pack of cigarettes a day for 1 year (for example: 1 pack a day for 30 years or 2 packs a day for 15 years). Yearly screening should continue until the smoker has stopped smoking for at least 15 years. Yearly screening should also be stopped for people who develop a health problem that would prevent them from having lung cancer treatment.  If you choose to drink alcohol, do not exceed 2 drinks per day. One drink is considered to be 12   ounces (355 mL) of beer, 5 ounces (148 mL) of wine, or 1.5 ounces (44 mL) of liquor.  Avoid use of street drugs. Do not share needles with anyone. Ask for help if you need support or instructions about stopping the use of drugs.  High blood pressure causes heart disease and increases the risk of stroke. Your blood pressure should be checked at least every 1 to 2 years. Ongoing high blood pressure should be treated with medicines, if weight loss and  exercise are not effective.  If you are 45 to 52 years old, ask your caregiver if you should take aspirin to prevent heart disease.  Diabetes screening involves taking a blood sample to check your fasting blood sugar level. This should be done once every 3 years, after age 45, if you are within normal weight and without risk factors for diabetes. Testing should be considered at a younger age or be carried out more frequently if you are overweight and have at least 1 risk factor for diabetes.  Colorectal cancer can be detected and often prevented. Most routine colorectal cancer screening begins at the age of 50 and continues through age 75. However, your caregiver may recommend screening at an earlier age if you have risk factors for colon cancer. On a yearly basis, your caregiver may provide home test kits to check for hidden blood in the stool. Use of a small camera at the end of a tube, to directly examine the colon (sigmoidoscopy or colonoscopy), can detect the earliest forms of colorectal cancer. Talk to your caregiver about this at age 50, when routine screening begins. Direct examination of the colon should be repeated every 5 to 10 years through age 75, unless early forms of pre-cancerous polyps or small growths are found.  Hepatitis C blood testing is recommended for all people born from 1945 through 1965 and any individual with known risks for hepatitis C.  Practice safe sex. Use condoms and avoid high-risk sexual practices to reduce the spread of sexually transmitted infections (STIs). STIs include gonorrhea, chlamydia, syphilis, trichomonas, herpes, HPV, and human immunodeficiency virus (HIV). Herpes, HIV, and HPV are viral illnesses that have no cure. They can result in disability, cancer, and death.  A one-time screening for abdominal aortic aneurysm (AAA) and surgical repair of large AAAs by sound wave imaging (ultrasonography) is recommended for ages 65 to 75 years who are current or  former smokers.  Healthy men should no longer receive prostate-specific antigen (PSA) blood tests as part of routine cancer screening. Consult with your caregiver about prostate cancer screening.  Testicular cancer screening is not recommended for adult males who have no symptoms. Screening includes self-exam, caregiver exam, and other screening tests. Consult with your caregiver about any symptoms you have or any concerns you have about testicular cancer.  Use sunscreen. Apply sunscreen liberally and repeatedly throughout the day. You should seek shade when your shadow is shorter than you. Protect yourself by wearing long sleeves, pants, a wide-brimmed hat, and sunglasses year round, whenever you are outdoors.  Once a month, do a whole body skin exam, using a mirror to look at the skin on your back. Notify your caregiver of new moles, moles that have irregular borders, moles that are larger than a pencil eraser, or moles that have changed in shape or color.  Stay current with required immunizations.  Influenza vaccine. All adults should be immunized every year.  Tetanus, diphtheria, and acellular pertussis (Td, Tdap) vaccine. An adult who has not   previously received Tdap or who does not know his vaccine status should receive 1 dose of Tdap. This initial dose should be followed by tetanus and diphtheria toxoids (Td) booster doses every 10 years. Adults with an unknown or incomplete history of completing a 3-dose immunization series with Td-containing vaccines should begin or complete a primary immunization series including a Tdap dose. Adults should receive a Td booster every 10 years.  Varicella vaccine. An adult without evidence of immunity to varicella should receive 2 doses or a second dose if he has previously received 1 dose.  Human papillomavirus (HPV) vaccine. Males aged 13 21 years who have not received the vaccine previously should receive the 3-dose series. Males aged 22 26 years may be  immunized. Immunization is recommended through the age of 26 years for any male who has sex with males and did not get any or all doses earlier. Immunization is recommended for any person with an immunocompromised condition through the age of 26 years if he did not get any or all doses earlier. During the 3-dose series, the second dose should be obtained 4 8 weeks after the first dose. The third dose should be obtained 24 weeks after the first dose and 16 weeks after the second dose.  Zoster vaccine. One dose is recommended for adults aged 60 years or older unless certain conditions are present.  Measles, mumps, and rubella (MMR) vaccine. Adults born before 1957 generally are considered immune to measles and mumps. Adults born in 1957 or later should have 1 or more doses of MMR vaccine unless there is a contraindication to the vaccine or there is laboratory evidence of immunity to each of the three diseases. A routine second dose of MMR vaccine should be obtained at least 28 days after the first dose for students attending postsecondary schools, health care workers, or international travelers. People who received inactivated measles vaccine or an unknown type of measles vaccine during 1963 1967 should receive 2 doses of MMR vaccine. People who received inactivated mumps vaccine or an unknown type of mumps vaccine before 1979 and are at high risk for mumps infection should consider immunization with 2 doses of MMR vaccine. Unvaccinated health care workers born before 1957 who lack laboratory evidence of measles, mumps, or rubella immunity or laboratory confirmation of disease should consider measles and mumps immunization with 2 doses of MMR vaccine or rubella immunization with 1 dose of MMR vaccine.  Pneumococcal 13-valent conjugate (PCV13) vaccine. When indicated, a person who is uncertain of his immunization history and has no record of immunization should receive the PCV13 vaccine. An adult aged 19 years or  older who has certain medical conditions and has not been previously immunized should receive 1 dose of PCV13 vaccine. This PCV13 should be followed with a dose of pneumococcal polysaccharide (PPSV23) vaccine. The PPSV23 vaccine dose should be obtained at least 8 weeks after the dose of PCV13 vaccine. An adult aged 19 years or older who has certain medical conditions and previously received 1 or more doses of PPSV23 vaccine should receive 1 dose of PCV13. The PCV13 vaccine dose should be obtained 1 or more years after the last PPSV23 vaccine dose.  Pneumococcal polysaccharide (PPSV23) vaccine. When PCV13 is also indicated, PCV13 should be obtained first. All adults aged 65 years and older should be immunized. An adult younger than age 65 years who has certain medical conditions should be immunized. Any person who resides in a nursing home or long-term care facility should be   immunized. An adult smoker should be immunized. People with an immunocompromised condition and certain other conditions should receive both PCV13 and PPSV23 vaccines. People with human immunodeficiency virus (HIV) infection should be immunized as soon as possible after diagnosis. Immunization during chemotherapy or radiation therapy should be avoided. Routine use of PPSV23 vaccine is not recommended for American Indians, [place] Natives, or people younger than 65 years unless there are medical conditions that require PPSV23 vaccine. When indicated, people who have unknown immunization and have no record of immunization should receive PPSV23 vaccine. One-time revaccination 5 years after the first dose of PPSV23 is recommended for people aged 19 64 years who have chronic kidney failure, nephrotic syndrome, asplenia, or immunocompromised conditions. People who received 1 2 doses of PPSV23 before age 65 years should receive another dose of PPSV23 vaccine at age 65 years or later if at least 5 years have passed since the previous dose. Doses of  PPSV23 are not needed for people immunized with PPSV23 at or after age 65 years.  Meningococcal vaccine. Adults with asplenia or persistent complement component deficiencies should receive 2 doses of quadrivalent meningococcal conjugate (MenACWY-D) vaccine. The doses should be obtained at least 2 months apart. Microbiologists working with certain meningococcal bacteria, military recruits, people at risk during an outbreak, and people who travel to or live in countries with a high rate of meningitis should be immunized. A first-year college student up through age 21 years who is living in a residence hall should receive a dose if he did not receive a dose on or after his 16th birthday. Adults who have certain high-risk conditions should receive one or more doses of vaccine.  Hepatitis A vaccine. Adults who wish to be protected from this disease, have certain high-risk conditions, work with hepatitis A-infected animals, work in hepatitis A research labs, or travel to or work in countries with a high rate of hepatitis A should be immunized. Adults who were previously unvaccinated and who anticipate close contact with an international adoptee during the first 60 days after arrival in the [place] from a country with a high rate of hepatitis A should be immunized.  Hepatitis B vaccine. Adults who wish to be protected from this disease, have certain high-risk conditions, may be exposed to blood or other infectious body fluids, are household contacts or sex partners of hepatitis B positive people, are clients or workers in certain care facilities, or travel to or work in countries with a high rate of hepatitis B should be immunized.  Haemophilus influenzae type b (Hib) vaccine. A previously unvaccinated person with asplenia or sickle cell disease or having a scheduled splenectomy should receive 1 dose of Hib vaccine. Regardless of previous immunization, a recipient of a hematopoietic stem cell transplant  should receive a 3-dose series 6 12 months after his successful transplant. Hib vaccine is not recommended for adults with HIV infection. Preventive Service / Frequency Ages 19 to 39  Blood pressure check.** / Every 1 to 2 years.  Lipid and cholesterol check.** / Every 5 years beginning at age 20.  Hepatitis C blood test.** / For any individual with known risks for hepatitis C.  Skin self-exam. / Monthly.  Influenza vaccine. / Every year.  Tetanus, diphtheria, and acellular pertussis (Tdap, Td) vaccine.** / Consult your caregiver. 1 dose of Td every 10 years.  Varicella vaccine.** / Consult your caregiver.  HPV vaccine. / 3 doses over 6 months, if 26 and younger.  Measles, mumps, rubella (MMR) vaccine.** /   You need at least 1 dose of MMR if you were born in 1957 or later. You may also need a 2nd dose.  Pneumococcal 13-valent conjugate (PCV13) vaccine.** / Consult your caregiver.  Pneumococcal polysaccharide (PPSV23) vaccine.** / 1 to 2 doses if you smoke cigarettes or if you have certain conditions.  Meningococcal vaccine.** / 1 dose if you are age 19 to 21 years and a first-year college student living in a residence hall, or have one of several medical conditions, you need to get vaccinated against meningococcal disease. You may also need additional booster doses.  Hepatitis A vaccine.** / Consult your caregiver.  Hepatitis B vaccine.** / Consult your caregiver.  Haemophilus influenzae type b (Hib) vaccine.** / Consult your caregiver. Ages 40 to 64  Blood pressure check.** / Every 1 to 2 years.  Lipid and cholesterol check.** / Every 5 years beginning at age 20.  Lung cancer screening. / Every year if you are aged 55 80 years and have a 30-pack-year history of smoking and currently smoke or have quit within the past 15 years. Yearly screening is stopped once you have quit smoking for at least 15 years or develop a health problem that would prevent you from having lung cancer  treatment.  Fecal occult blood test (FOBT) of stool. / Every year beginning at age 50 and continuing until age 75. You may not have to do this test if you get colonoscopy every 10 years.  Flexible sigmoidoscopy** or colonoscopy.** / Every 5 years for a flexible sigmoidoscopy or every 10 years for a colonoscopy beginning at age 50 and continuing until age 75.  Hepatitis C blood test.** / For all people born from 1945 through 1965 and any individual with known risks for hepatitis C.  Skin self-exam. / Monthly.  Influenza vaccine. / Every year.  Tetanus, diphtheria, and acellular pertussis (Tdap/Td) vaccine.** / Consult your caregiver. 1 dose of Td every 10 years.  Varicella vaccine.** / Consult your caregiver.  Zoster vaccine.** / 1 dose for adults aged 60 years or older.  Measles, mumps, rubella (MMR) vaccine.** / You need at least 1 dose of MMR if you were born in 1957 or later. You may also need a 2nd dose.  Pneumococcal 13-valent conjugate (PCV13) vaccine.** / Consult your caregiver.  Pneumococcal polysaccharide (PPSV23) vaccine.** / 1 to 2 doses if you smoke cigarettes or if you have certain conditions.  Meningococcal vaccine.** / Consult your caregiver.  Hepatitis A vaccine.** / Consult your caregiver.  Hepatitis B vaccine.** / Consult your caregiver.  Haemophilus influenzae type b (Hib) vaccine.** / Consult your caregiver. Ages 65 and over  Blood pressure check.** / Every 1 to 2 years.  Lipid and cholesterol check.**/ Every 5 years beginning at age 20.  Lung cancer screening. / Every year if you are aged 55 80 years and have a 30-pack-year history of smoking and currently smoke or have quit within the past 15 years. Yearly screening is stopped once you have quit smoking for at least 15 years or develop a health problem that would prevent you from having lung cancer treatment.  Fecal occult blood test (FOBT) of stool. / Every year beginning at age 50 and continuing until  age 75. You may not have to do this test if you get colonoscopy every 10 years.  Flexible sigmoidoscopy** or colonoscopy.** / Every 5 years for a flexible sigmoidoscopy or every 10 years for a colonoscopy beginning at age 50 and continuing until age 75.  Hepatitis C blood   test.** / For all people born from 1945 through 1965 and any individual with known risks for hepatitis C.  Abdominal aortic aneurysm (AAA) screening.** / A one-time screening for ages 65 to 75 years who are current or former smokers.  Skin self-exam. / Monthly.  Influenza vaccine. / Every year.  Tetanus, diphtheria, and acellular pertussis (Tdap/Td) vaccine.** / 1 dose of Td every 10 years.  Varicella vaccine.** / Consult your caregiver.  Zoster vaccine.** / 1 dose for adults aged 60 years or older.  Pneumococcal 13-valent conjugate (PCV13) vaccine.** / Consult your caregiver.  Pneumococcal polysaccharide (PPSV23) vaccine.** / 1 dose for all adults aged 65 years and older.  Meningococcal vaccine.** / Consult your caregiver.  Hepatitis A vaccine.** / Consult your caregiver.  Hepatitis B vaccine.** / Consult your caregiver.  Haemophilus influenzae type b (Hib) vaccine.** / Consult your caregiver. **Family history and personal history of risk and conditions may change your caregiver's recommendations. Document Released: 03/17/2001 Document Revised: 05/16/2012 Document Reviewed: 06/16/2010 [employer] Patient Information 2014 [employer].  

## 2012-12-22 NOTE — Progress Notes (Signed)
Patient ID: Dakota [name], male   DOB: 17-Sep-1960, 52 y.o.   MRN: [id number] PHILO KURTZ [id number] Feb 12, 1960 12/22/2012      Progress Note-Follow Up  Subjective  Chief Complaint  Chief Complaint  Patient presents with  . Establish Care    new patient  . Knee Injury    left knee X 2-3 weeks- running injury    HPI  Patient is a 53 year old Caucasian male who is in today for new patient appointment. He is accompanied to the past medical history includes myocardial infarction in 2006. He's been asymptomatic since then but unfortunately continues to smoke. Struggles with some low-grade but persistent depression as well. Is here today ascending pain as well. He twisted his knee while exercising and has had some swelling and pain ever since. He is a regular) would like to get started back running again but the pain is too intense. No warmth or redness. Denies chest pain or palpitations at this time. For shortness of breath GI or GU complaints noted.  Past Medical History  Diagnosis Date  . CAD (coronary artery disease)   . Hypertension   . Hyperlipidemia   . Depression   . Chicken pox as a child  . Mumps as a child  . Asthma     childhood  . Allergy     cats  . Anxiety     Past Surgical History  Procedure Laterality Date  . Coronary stent placement  01-14-05    Family History  Problem Relation Age of Onset  . Multiple sclerosis Mother   . Cancer Father     esophagus/ brain  . Alcohol abuse Father     smoker  . Seizures Sister   . COPD Sister   . Alcohol abuse Sister     addiction, recovered  . Heart disease Maternal Grandfather     MI    History   Social History  . Marital Status: Legally Separated    Spouse Name: N/A    Number of Children: N/A  . Years of Education: N/A   Occupational History  . Not on file.   Social History Main Topics  . Smoking status: Current Every Day Smoker -- 0.50 packs/day for 20 years    Types: Cigarettes  . Smokeless  tobacco: Never Used  . Alcohol Use: Yes     Comment: 5 or 6 drinks a week   . Drug Use: No  . Sexual Activity: Yes    Partners: Female     Comment: wife, real Psychologist, occupational, no dairy   Other Topics Concern  . Not on file   Social History Narrative  . No narrative on file    Current Outpatient Prescriptions on File Prior to Visit  Medication Sig Dispense Refill  . amLODipine (NORVASC) 5 MG tablet Take 1 tablet (5 mg total) by mouth daily.  30 tablet  9  . aspirin 81 MG tablet Take 160 mg by mouth daily.      . Multiple Vitamin (MULTIVITAMIN) capsule Take 1 capsule by mouth daily.      . pravastatin (PRAVACHOL) 80 MG tablet Take one tablet ( 80 mg ) daily  30 tablet  9   No current facility-administered medications on file prior to visit.    Allergies  Allergen Reactions  . Neosporin [Neomycin-Bacitracin Zn-Polymyx] Hives and Itching    Review of Systems  Review of Systems  Constitutional: Negative for fever, chills and malaise/fatigue.  HENT: Negative for congestion, hearing loss  and nosebleeds.   Eyes: Negative for discharge.  Respiratory: Negative for cough, sputum production, shortness of breath and wheezing.   Cardiovascular: Negative for chest pain, palpitations and leg swelling.  Gastrointestinal: Negative for heartburn, nausea, vomiting, abdominal pain, diarrhea, constipation and blood in stool.  Genitourinary: Negative for dysuria, urgency, frequency and hematuria.  Musculoskeletal: Positive for joint pain. Negative for back pain, falls and myalgias.       Left knee pain  Skin: Negative for rash.  Neurological: Negative for dizziness, tremors, sensory change, focal weakness, loss of consciousness, weakness and headaches.  Endo/Heme/Allergies: Negative for polydipsia. Does not bruise/bleed easily.  Psychiatric/Behavioral: Negative for depression and suicidal ideas. The patient is not nervous/anxious and does not have insomnia.     Objective  BP 110/74   Pulse 82  Temp(Src) 98 F (36.7 C) (Oral)  Ht 6' (1.829 m)  Wt 203 lb 1.9 oz (92.135 kg)  BMI 27.54 kg/m2  SpO2 96%  Physical Exam  Physical Exam  Constitutional: He is oriented to person, place, and time and well-developed, well-nourished, and in no distress. No distress.  HENT:  Head: Normocephalic and atraumatic.  Eyes: Conjunctivae are normal.  Neck: Neck supple. No thyromegaly present.  Cardiovascular: Normal rate, regular rhythm and normal heart sounds.   No murmur heard. Pulmonary/Chest: Effort normal and breath sounds normal. No respiratory distress.  Abdominal: He exhibits no distension and no mass. There is no tenderness.  Musculoskeletal: He exhibits no edema.  Neurological: He is alert and oriented to person, place, and time.  Skin: Skin is warm.  Psychiatric: Memory, affect and judgment normal.    No results found for this basename: TSH   No results found for this basename: WBC, HGB, HCT, MCV, PLT   Lab Results  Component Value Date   CREATININE 0.9 09/13/2007   BUN 11 09/13/2007   NA 138 09/13/2007   K 4.0 09/13/2007   CL 103 09/13/2007   CO2 27 09/13/2007   Lab Results  Component Value Date   ALT 21 02/11/2011   AST 18 02/11/2011   ALKPHOS 71 02/11/2011   BILITOT 0.6 02/11/2011   Lab Results  Component Value Date   CHOL 210* 02/11/2011   Lab Results  Component Value Date   HDL 39* 02/11/2011   Lab Results  Component Value Date   LDLCALC 145* 02/11/2011   Lab Results  Component Value Date   TRIG 132 02/11/2011   Lab Results  Component Value Date   CHOLHDL 5.4 02/11/2011     Assessment & Plan  HYPERLIPIDEMIA-MIXED Tolerating Pravachol, encouraged krill oil caps, continue exercise, minimize simple carbs and saturated fats  HYPERTENSION, UNSPECIFIED Well controlled, no changes  Tobacco abuse 1/2 ppd encouraged complete cessation. Has tried nicotine replacements and Chantix in pasat  Depression Will start Wellbutrin and reassess at next  visit.  Allergy May use antihistamines prn  CAD, NATIVE VESSEL asymptomatic  Left knee pain Referred to sports med for futher consideration, may try topical NSAIDs

## 2012-12-25 ENCOUNTER — Encounter: Payer: Self-pay | Admitting: Family Medicine

## 2012-12-25 DIAGNOSIS — F329 Major depressive disorder, single episode, unspecified: Secondary | ICD-10-CM | POA: Insufficient documentation

## 2012-12-25 DIAGNOSIS — M25562 Pain in left knee: Secondary | ICD-10-CM

## 2012-12-25 DIAGNOSIS — F32A Depression, unspecified: Secondary | ICD-10-CM

## 2012-12-25 DIAGNOSIS — M25561 Pain in right knee: Secondary | ICD-10-CM | POA: Insufficient documentation

## 2012-12-25 HISTORY — DX: Pain in left knee: M25.562

## 2012-12-25 HISTORY — DX: Depression, unspecified: F32.A

## 2012-12-25 NOTE — Assessment & Plan Note (Signed)
1/2 ppd encouraged complete cessation. Has tried nicotine replacements and Chantix in pasat

## 2012-12-25 NOTE — Assessment & Plan Note (Signed)
Will start Wellbutrin and reassess at next visit.

## 2012-12-25 NOTE — Assessment & Plan Note (Signed)
asymptomatic

## 2012-12-25 NOTE — Assessment & Plan Note (Addendum)
May use antihistamines prn. 

## 2012-12-25 NOTE — Assessment & Plan Note (Signed)
Referred to sports med for futher consideration, may try topical NSAIDs

## 2012-12-25 NOTE — Assessment & Plan Note (Signed)
Well controlled, no changes 

## 2012-12-25 NOTE — Assessment & Plan Note (Signed)
Tolerating Pravachol, encouraged krill oil caps, continue exercise, minimize simple carbs and saturated fats

## 2012-12-26 ENCOUNTER — Other Ambulatory Visit (INDEPENDENT_AMBULATORY_CARE_PROVIDER_SITE_OTHER): Payer: Managed Care, Other (non HMO)

## 2012-12-26 ENCOUNTER — Encounter: Payer: Self-pay | Admitting: Family Medicine

## 2012-12-26 ENCOUNTER — Ambulatory Visit (INDEPENDENT_AMBULATORY_CARE_PROVIDER_SITE_OTHER): Payer: Managed Care, Other (non HMO) | Admitting: Family Medicine

## 2012-12-26 VITALS — BP 130/82 | HR 82 | Wt 201.0 lb

## 2012-12-26 DIAGNOSIS — M25569 Pain in unspecified knee: Secondary | ICD-10-CM

## 2012-12-26 DIAGNOSIS — S8392XA Sprain of unspecified site of left knee, initial encounter: Secondary | ICD-10-CM | POA: Insufficient documentation

## 2012-12-26 DIAGNOSIS — M25562 Pain in left knee: Secondary | ICD-10-CM

## 2012-12-26 DIAGNOSIS — IMO0002 Reserved for concepts with insufficient information to code with codable children: Secondary | ICD-10-CM

## 2012-12-26 MED ORDER — MELOXICAM 15 MG PO TABS: 15.0000 mg | ORAL_TABLET | Freq: Every day | ORAL | Status: DC

## 2012-12-26 NOTE — Patient Instructions (Addendum)
Very nice to meet you Try the brace with activity Try exercises daily Ice 20 minutes 2 times a day Meloxicam daily for 10 days then as needed.  Come back again in 3-4 weeks. If still in pain we can consider injection.  Start below in 3 days if you want.  Start a walk-run progression: - I would like you to do line drills (try to keep foot on a line when jogging). - Initially start one minute walking than one minute running for 20 mins in the first week,   then 25 mins during the second week, then 30 mins afterwards.  Once you have reached 30 mins: - Run 2 mins, then walk 1 min. -Then run 3 mins, and walk 1 min. -Then run 4 mins, and walk 1 min. -Then run 5 mins, and walk 1 min. -Slowly build up weekly to running 30 mins nonstop.  If painful at any of the steps, back up one step.

## 2012-12-26 NOTE — Progress Notes (Signed)
Pre-visit discussion using our clinic review tool. No additional management support is needed unless otherwise documented below in the visit note.  

## 2012-12-26 NOTE — Assessment & Plan Note (Signed)
Patient has what appears to be more of a knee sprain with continued effusion. I do not see any signs of a meniscal tear today but that has not been if he has a internal tear that could be missed. Patient will do meloxicam daily for 10 days, discussed icing protocol, patient was given also home exercise program ensured proper technique. Patient will try these interventions and come back again in 3 weeks. If he continues to have pain I would consider getting x-rays as well as to the corticosteroid injection.

## 2012-12-26 NOTE — Progress Notes (Signed)
  I'm seeing this patient by the request  of:  BLYTH, Misty Stanley, MD   CC: Left knee pain  HPI: Patient is a very pleasant 52 year old gentleman who is coming in approximately 3 weeks after a running injury to the left knee. Patient states he twisted his knee while exercising and her members having some swelling within 24 hours. Patient states since then he unfortunately continues to have pain. Patient states over the course of last 3 days though it has started to get better within not doing any activity. Patient states it still hurts when he does do certain activities such as going up or down stairs. Patient states that the pain is more of a generalized dull aching sensation that seems to be more intermittent. Patient denies any clicking, locking, or giving out on him. Patient has tried ibuprofen which has been helpful. Patient is also tried a compression sleeve which he says helps. Patient rates the severity is more of 3/10 at this time but was as bad as 8/10 previously.   Past medical, surgical, family and social history reviewed. Medications reviewed all in the electronic medical record.   Review of Systems: No headache, visual changes, nausea, vomiting, diarrhea, constipation, dizziness, abdominal pain, skin rash, fevers, chills, night sweats, weight loss, swollen lymph nodes, body aches, joint swelling, muscle aches, chest pain, shortness of breath, mood changes.   Objective:    Blood pressure 130/82, pulse 82, weight 201 lb (91.173 kg), SpO2 95.00%.   General: No apparent distress alert and oriented x3 mood and affect normal, dressed appropriately.  HEENT: Pupils equal, extraocular movements intact Respiratory: Patient's speak in full sentences and does not appear short of breath Cardiovascular: No lower extremity edema, non tender, no erythema Skin: Warm dry intact with no signs of infection or rash on extremities or on axial skeleton. Abdomen: Soft nontender Neuro: Cranial nerves II  through XII are intact, neurovascularly intact in all extremities with 2+ DTRs and 2+ pulses. Lymph: No lymphadenopathy of posterior or anterior cervical chain or axillae bilaterally.  Gait normal with good balance and coordination.  MSK: Non tender with full range of motion and good stability and symmetric strength and tone of shoulders, elbows, wrist, hip, and ankles bilaterally.  Knee: Left Normal to inspection with no erythema or effusion or obvious bony abnormalities. Palpation normal with no warmth, joint line tenderness, patellar tenderness, or condyle tenderness. ROM full in flexion and extension and lower leg rotation. Ligaments with solid consistent endpoints including ACL, PCL, LCL, MCL. Negative Mcmurray's, Apley's,  positive Thessalonian tests. Non painful patellar compression. Patellar glide without crepitus. Patellar and quadriceps tendons unremarkable. Hamstring and quadriceps strength is normal.   MSK US performed of: Left knee This study was ordered, performed, and interpreted by Terrilee Files D.O.  Knee: All structures visualized. Anteromedial, anterolateral, posteromedial, and posterolateral menisci unremarkable without tearing, fraying, effusion, or displacement. Patellar Tendon unremarkable on long and transverse views without effusion. Patient's quadricep tendon is normal but does have a suprapatellar effusion that tracks up to approximately 5 cm above the superior portion of the patella. No abnormality of prepatellar bursa. LCL and MCL unremarkable on long and transverse views. No abnormality of origin of medial or lateral head of the gastrocnemius.  IMPRESSION:  Mild knee effusion   Impression and Recommendations:     This case required medical decision making of moderate complexity.

## 2013-01-16 ENCOUNTER — Ambulatory Visit (INDEPENDENT_AMBULATORY_CARE_PROVIDER_SITE_OTHER)
Admission: RE | Admit: 2013-01-16 | Discharge: 2013-01-16 | Disposition: A | Discharge status: Home / Self Care | Payer: Managed Care, Other (non HMO) | Source: Ambulatory Visit | Attending: Family Medicine | Admitting: Family Medicine

## 2013-01-16 ENCOUNTER — Ambulatory Visit (INDEPENDENT_AMBULATORY_CARE_PROVIDER_SITE_OTHER): Payer: Managed Care, Other (non HMO) | Admitting: Family Medicine

## 2013-01-16 ENCOUNTER — Encounter: Payer: Self-pay | Admitting: Family Medicine

## 2013-01-16 ENCOUNTER — Other Ambulatory Visit (INDEPENDENT_AMBULATORY_CARE_PROVIDER_SITE_OTHER): Payer: Managed Care, Other (non HMO)

## 2013-01-16 VITALS — BP 134/88 | HR 87

## 2013-01-16 DIAGNOSIS — M25562 Pain in left knee: Secondary | ICD-10-CM

## 2013-01-16 DIAGNOSIS — M25569 Pain in unspecified knee: Secondary | ICD-10-CM

## 2013-01-16 IMAGING — CR DG KNEE STANDING AP BILAT
1 series · 1 of 1 positions shown · non-contrast
Comparison: Left knee films same date.

CLINICAL DATA: Injury left knee 1 month ago.  Anterior pain.

EXAM:
BILATERAL KNEES STANDING - 1 VIEW

[view not recorded]
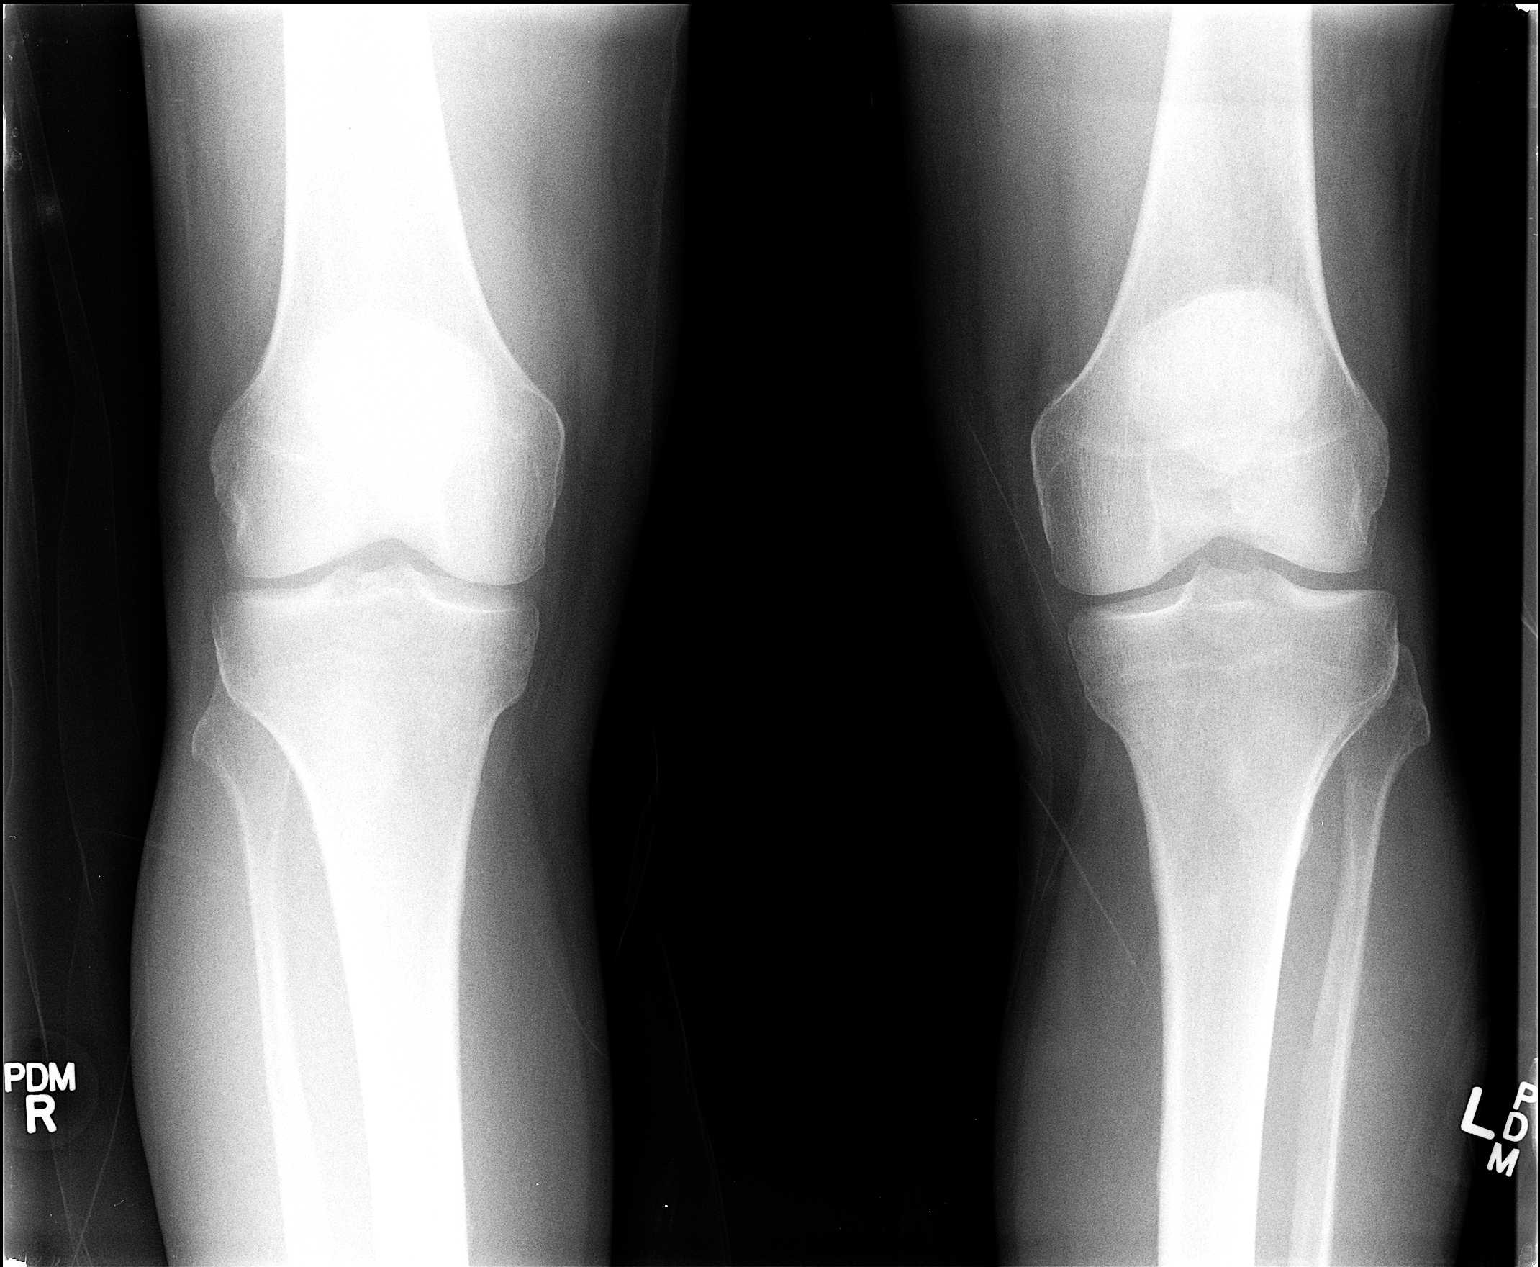

[1 of 1 positions shown; findings below may reference images not displayed]

FINDINGS: No fracture or dislocation.

No significant joint space narrowing.
IMPRESSION: No fracture or dislocation.

No significant joint space narrowing.

## 2013-01-16 IMAGING — CR DG KNEE AP/LAT W/ SUNRISE*L*
3 series · 3 of 3 positions shown · non-contrast
Comparison: None.

CLINICAL DATA: Injury left knee 1 month ago.  Anterior pain.

EXAM:
DG KNEE - 3 VIEWS

[view not recorded (1 of 3)]
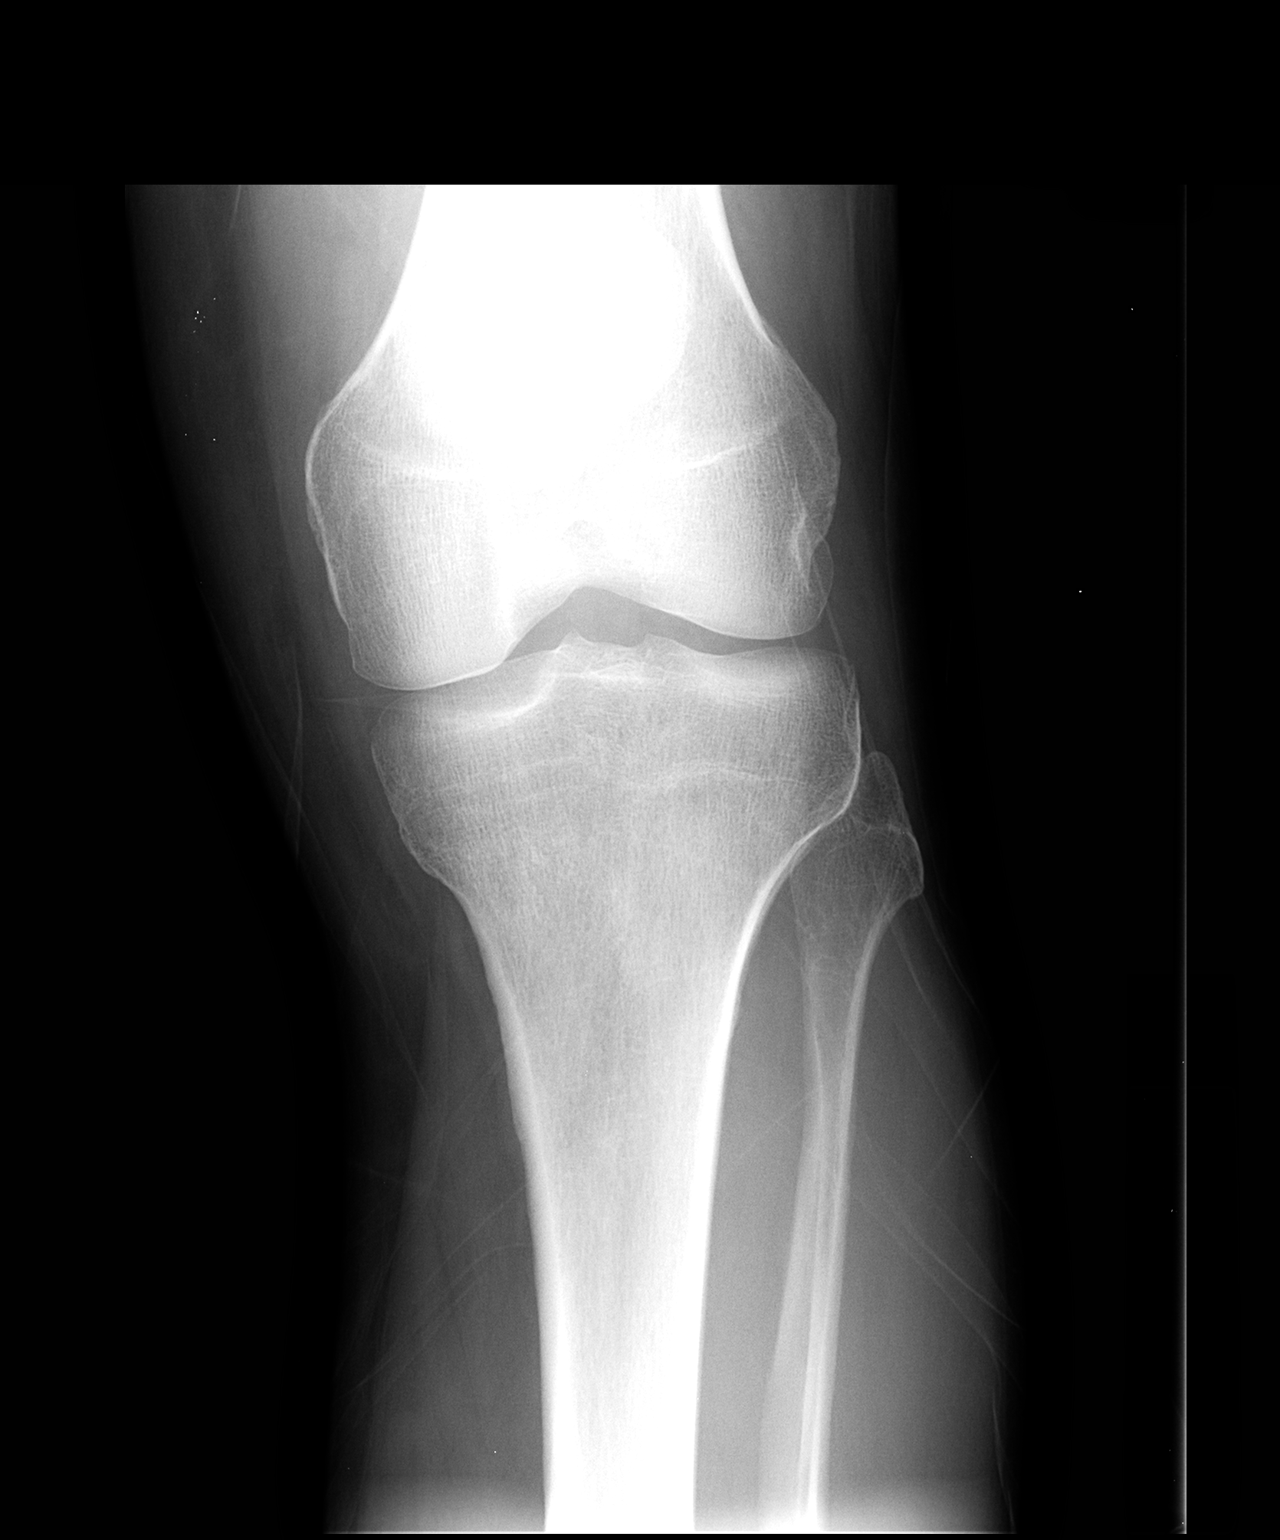

[view not recorded (2 of 3)]
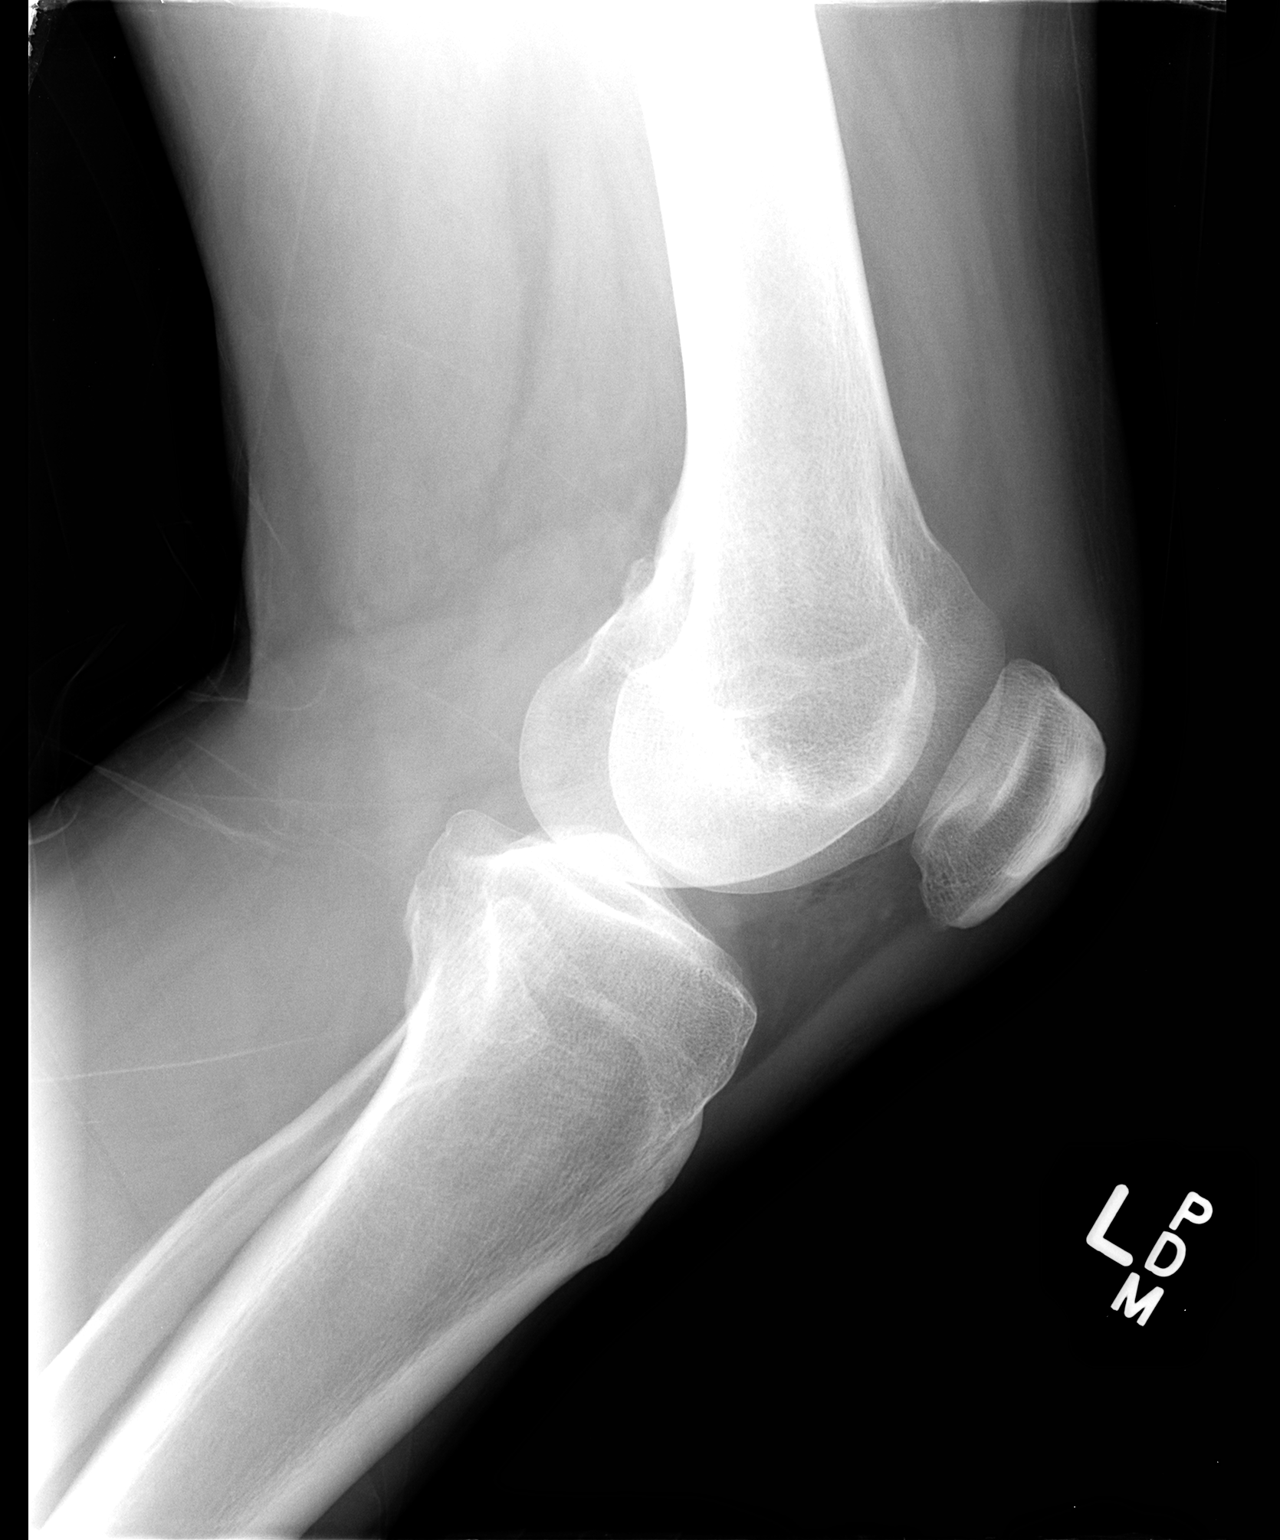

[view not recorded (3 of 3)]
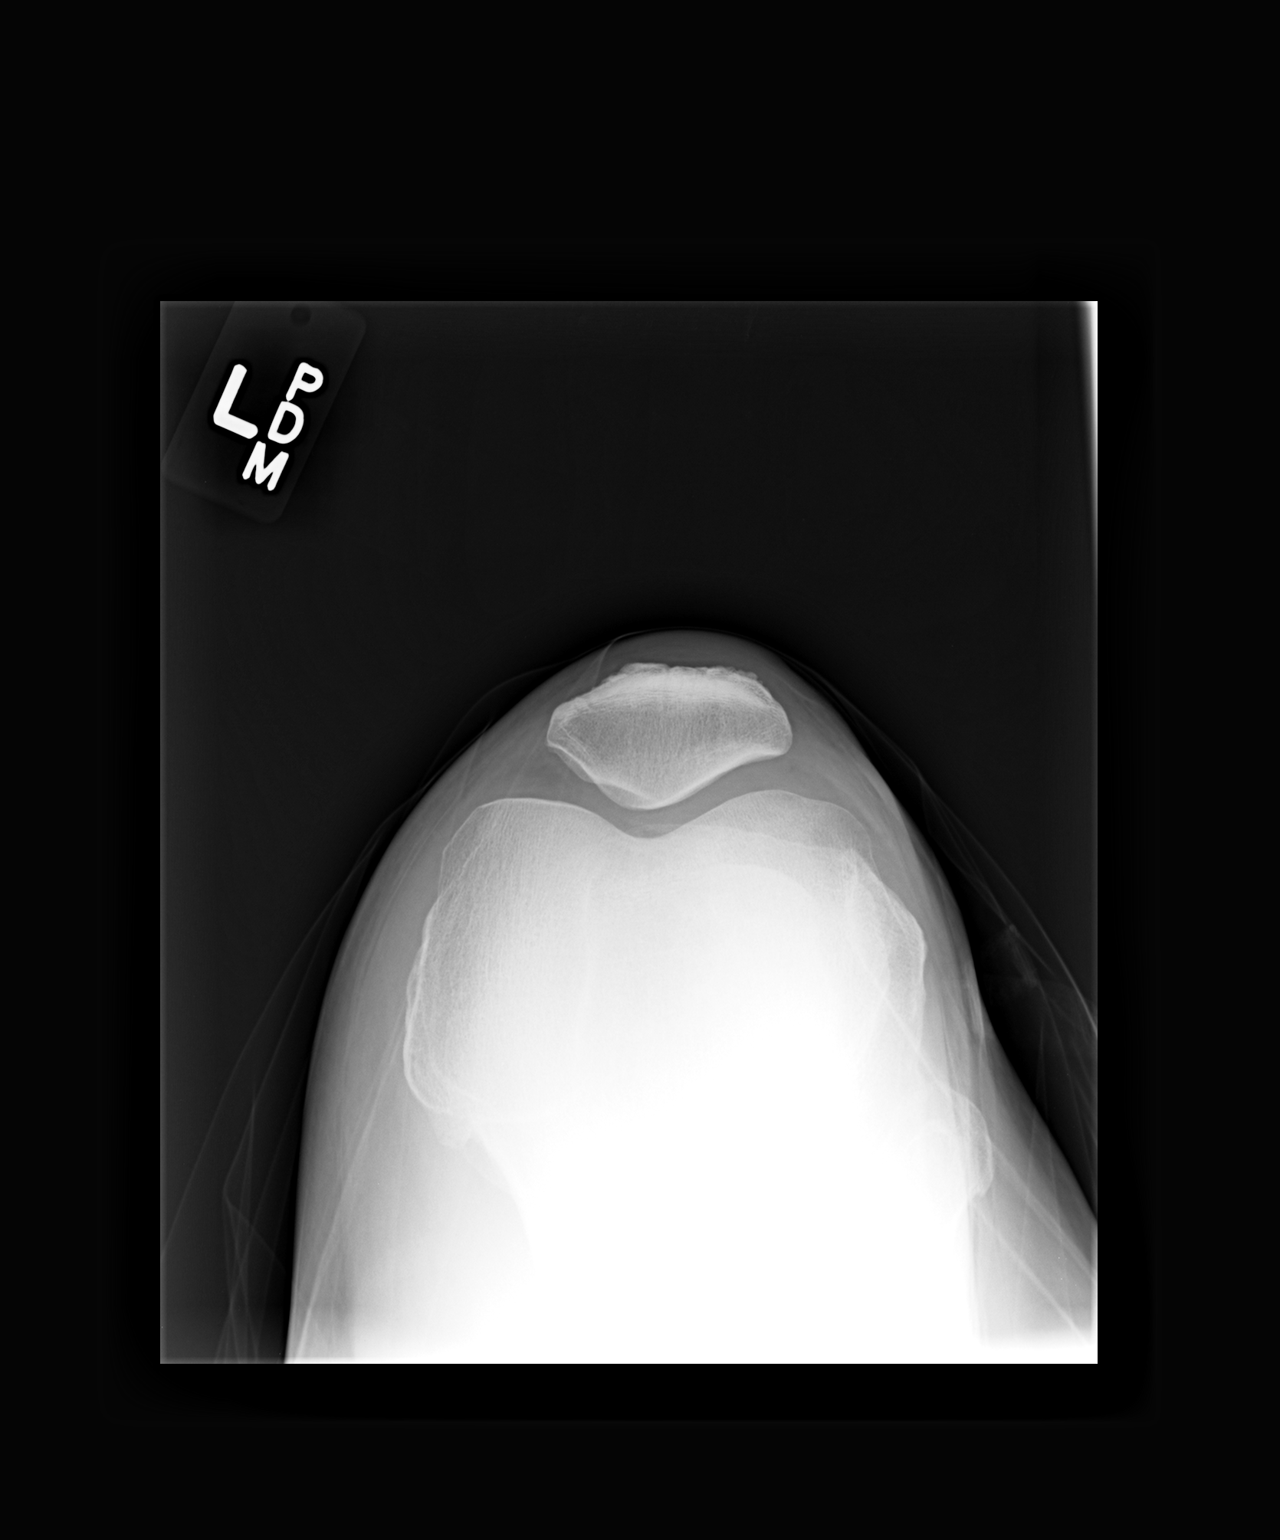

[3 of 3 positions shown; findings below may reference images not displayed]

FINDINGS: No fracture or dislocation.

No plain film evidence of joint effusion.
IMPRESSION: No fracture or dislocation.

## 2013-01-16 NOTE — Assessment & Plan Note (Signed)
Patient had injection as described above. Patient encouraged to continue exercises at least 3 times a week. Discussed icing protocol. Patient will try these interventions as well as get x-rays to rule out any bony deformity. His lungs patient is doing well he'll follow up in 4 weeks for further evaluation. If he has any worsening pain he will call and we will order an MRI to rule out internal intersubstance meniscal tear.

## 2013-01-16 NOTE — Progress Notes (Signed)
Pre-visit discussion using our clinic review tool. No additional management support is needed unless otherwise documented below in the visit note.  

## 2013-01-16 NOTE — Patient Instructions (Signed)
Good to see you Continue the meloxicam, icing and do home exercises 4 times a week Lets get the xrays just in case.  Come back again in 1 month.

## 2013-01-16 NOTE — Progress Notes (Signed)
   CC: Left knee Sprain follow up.   HPI: Patient is a 52 year old gentleman who is coming in for followup of left knee sprain. Patient was found to have a non-specific effusion of the left knee. This was not enough to have aspiration at that time. Patient was given meloxicam, icing protocol, and home exercise program. Patient states since this time he is doing approximately 80% better. Patient states that he is able to do all activities but still has some mild discomfort when doing certain things such as rotation. Patient states that it feels that it is just right under his kneecap. Patient denies any radiation or numbness. Patient is able to do all activities daily living without any discomfort. Patient would like to start running again possible. Patient is somewhat concerned about to start running at this time.   Past medical, surgical, family and social history reviewed. Medications reviewed all in the electronic medical record.   Review of Systems: No headache, visual changes, nausea, vomiting, diarrhea, constipation, dizziness, abdominal pain, skin rash, fevers, chills, night sweats, weight loss, swollen lymph nodes, body aches, joint swelling, muscle aches, chest pain, shortness of breath, mood changes.   Objective:    Blood pressure 134/88, pulse 87, SpO2 95.00%.   General: No apparent distress alert and oriented x3 mood and affect normal, dressed appropriately.  HEENT: Pupils equal, extraocular movements intact Respiratory: Patient's speak in full sentences and does not appear short of breath Cardiovascular: No lower extremity edema, non tender, no erythema Skin: Warm dry intact with no signs of infection or rash on extremities or on axial skeleton. Abdomen: Soft nontender Neuro: Cranial nerves II through XII are intact, neurovascularly intact in all extremities with 2+ DTRs and 2+ pulses. Lymph: No lymphadenopathy of posterior or anterior cervical chain or axillae bilaterally.  Gait  normal with good balance and coordination.  MSK: Non tender with full range of motion and good stability and symmetric strength and tone of shoulders, elbows, wrist, hip, and ankles bilaterally.  Knee: Left Normal to inspection with no erythema or effusion or obvious bony abnormalities. Palpation normal with no warmth, joint line tenderness, patellar tenderness, or condyle tenderness. ROM full in flexion and extension and lower leg rotation. Ligaments with solid consistent endpoints including ACL, PCL, LCL, MCL. Negative Mcmurray's, Apley's,  positive Thessalonian tests. Non painful patellar compression. Patellar glide with mild crepitus. Patellar and quadriceps tendons unremarkable. Hamstring and quadriceps strength is normal.   Procedure note After informed written and verbal consent, patient was seated on exam table. Left knee was prepped with alcohol swab and utilizing anterolateral approach, patient's left knee space was injected with 4:1  marcaine 0.5%: Kenalog 40mg /dL. Patient tolerated the procedure well without immediate complications.  Impression and Recommendations:     This case required medical decision making of moderate complexity.

## 2013-01-22 ENCOUNTER — Other Ambulatory Visit: Payer: Self-pay | Admitting: Family Medicine

## 2013-01-23 ENCOUNTER — Ambulatory Visit (HOSPITAL_COMMUNITY): Payer: Managed Care, Other (non HMO) | Attending: Cardiology

## 2013-01-23 ENCOUNTER — Other Ambulatory Visit (INDEPENDENT_AMBULATORY_CARE_PROVIDER_SITE_OTHER): Payer: Managed Care, Other (non HMO)

## 2013-01-23 DIAGNOSIS — E785 Hyperlipidemia, unspecified: Secondary | ICD-10-CM

## 2013-01-23 DIAGNOSIS — I6529 Occlusion and stenosis of unspecified carotid artery: Secondary | ICD-10-CM

## 2013-01-23 DIAGNOSIS — I2510 Atherosclerotic heart disease of native coronary artery without angina pectoris: Secondary | ICD-10-CM

## 2013-01-23 LAB — LIPID PANEL
Cholesterol: 175 mg/dL (ref 0–200)
HDL: 41.5 mg/dL (ref >39.00)
LDL Cholesterol: 118 mg/dL — ABNORMAL HIGH (ref 0–99)
Triglycerides: 76 mg/dL (ref 0.0–149.0)
VLDL: 15.2 mg/dL (ref 0.0–40.0)

## 2013-01-23 LAB — HEPATIC FUNCTION PANEL
ALT: 30 U/L (ref 0–53)
AST: 22 U/L (ref 0–37)
Alkaline Phosphatase: 66 U/L (ref 39–117)
Total Bilirubin: 0.7 mg/dL (ref 0.3–1.2)
Total Protein: 7.5 g/dL (ref 6.0–8.3)

## 2013-01-23 NOTE — Telephone Encounter (Signed)
Wellbutrin request Denied: buPROPion (WELLBUTRIN XL) 300 MG 24 hr tablet 30 tablet 2 12/22/2012 Sig - Route: Take 1 tablet (300 mg total) by mouth daily. - Oral Notes to Pharmacy: Start when 150 dose complete E-Prescribing Status: Receipt confirmed by pharmacy (12/22/2012 9:31 AM EST)  eScribe request for refill on Zolpidem Last filled - 11.20.14, #30x1 [Print script, cannot locate in file cabinet; must have been given at [facility]], EMR showing Rx has End Date of 12.20.14 on it, but no reason as to why (i.e. Cancel??) Last AEX - 11.20.14 Next AEX - 3 Months Please Advise if Refill on Rx is valid/SLS

## 2013-02-20 ENCOUNTER — Ambulatory Visit: Payer: Managed Care, Other (non HMO) | Admitting: Family Medicine

## 2013-04-03 ENCOUNTER — Other Ambulatory Visit: Payer: Self-pay | Admitting: Family Medicine

## 2013-04-03 NOTE — Telephone Encounter (Signed)
RX faxed

## 2013-04-03 NOTE — Telephone Encounter (Signed)
Please advise refill?  Last RX was wrote on 01-22-13, quantity 30 with 0 refills  If ok fax to [phone]  Pt was asked to schedule a follow up around 03-24-13- no appts are scheduled

## 2013-05-18 ENCOUNTER — Other Ambulatory Visit: Payer: Self-pay | Admitting: Family Medicine

## 2013-05-18 ENCOUNTER — Telehealth: Payer: Self-pay

## 2013-05-18 DIAGNOSIS — F32A Depression, unspecified: Secondary | ICD-10-CM

## 2013-05-18 DIAGNOSIS — F329 Major depressive disorder, single episode, unspecified: Secondary | ICD-10-CM

## 2013-05-18 MED ORDER — BUPROPION HCL ER (XL) 300 MG PO TB24: 300.0000 mg | ORAL_TABLET | Freq: Every day | ORAL | Status: DC

## 2013-05-18 NOTE — Telephone Encounter (Signed)
Please inform pt that Bupropion was sent in (30 day supply) and md wanted him to return around 03-24-13. Pt will need a follow up appt for addt refills. thanks

## 2013-05-18 NOTE — Telephone Encounter (Signed)
med refill scheduled for 06/09/13 and cpe scheduled for 09/19/13

## 2013-05-19 NOTE — Telephone Encounter (Signed)
Faxed

## 2013-05-19 NOTE — Telephone Encounter (Signed)
Please advise ambien refill? Last RX was done on 04-03-13 quantity 30 with 0 refills  Next appt 06-09-13  If ok fax to [phone]

## 2013-06-09 ENCOUNTER — Encounter: Payer: Self-pay | Admitting: Family Medicine

## 2013-06-09 ENCOUNTER — Ambulatory Visit (INDEPENDENT_AMBULATORY_CARE_PROVIDER_SITE_OTHER): Payer: Managed Care, Other (non HMO) | Admitting: Family Medicine

## 2013-06-09 VITALS — BP 128/78 | HR 86 | Temp 97.8°F | Ht 72.0 in | Wt 197.0 lb

## 2013-06-09 DIAGNOSIS — E785 Hyperlipidemia, unspecified: Secondary | ICD-10-CM

## 2013-06-09 DIAGNOSIS — F32A Depression, unspecified: Secondary | ICD-10-CM

## 2013-06-09 DIAGNOSIS — F3289 Other specified depressive episodes: Secondary | ICD-10-CM

## 2013-06-09 DIAGNOSIS — F489 Nonpsychotic mental disorder, unspecified: Secondary | ICD-10-CM

## 2013-06-09 DIAGNOSIS — F329 Major depressive disorder, single episode, unspecified: Secondary | ICD-10-CM

## 2013-06-09 DIAGNOSIS — I10 Essential (primary) hypertension: Secondary | ICD-10-CM

## 2013-06-09 DIAGNOSIS — F5105 Insomnia due to other mental disorder: Secondary | ICD-10-CM

## 2013-06-09 MED ORDER — ZOLPIDEM TARTRATE 10 MG PO TABS: 10.0000 mg | ORAL_TABLET | Freq: Every evening | ORAL | Status: DC | PRN

## 2013-06-09 MED ORDER — BUPROPION HCL ER (XL) 300 MG PO TB24: 300.0000 mg | ORAL_TABLET | Freq: Every day | ORAL | Status: DC

## 2013-06-09 NOTE — Progress Notes (Signed)
Pre visit review using our clinic review tool, if applicable. No additional management support is needed unless otherwise documented below in the visit note. 

## 2013-06-09 NOTE — Patient Instructions (Signed)

## 2013-06-10 ENCOUNTER — Telehealth: Payer: Self-pay | Admitting: Family Medicine

## 2013-06-10 NOTE — Telephone Encounter (Signed)
Relevant patient education assigned to patient using Emmi. ° °

## 2013-06-11 ENCOUNTER — Encounter: Payer: Self-pay | Admitting: Family Medicine

## 2013-06-11 DIAGNOSIS — G4700 Insomnia, unspecified: Secondary | ICD-10-CM

## 2013-06-11 DIAGNOSIS — F5105 Insomnia due to other mental disorder: Secondary | ICD-10-CM

## 2013-06-11 HISTORY — DX: Insomnia, unspecified: G47.00

## 2013-06-11 HISTORY — DX: Insomnia due to other mental disorder: F51.05

## 2013-06-11 NOTE — Progress Notes (Signed)
Patient ID: Dakota [name], male   DOB: 17-May-1960, 53 y.o.   MRN: [id number] Dakota [name] [id number] [birth year] 06/11/2013      Progress Note-Follow Up  Subjective  Chief Complaint  Chief Complaint  Patient presents with  . Medication Refill    HPI  Patient is a 53 year old male in today for routine medical care. Today for followup. Trying to exercise regularly. Runs 2-3 times a week. Has just restarted after a long break secondary to some knee pain he developed. This is improved with rest. His cigarette smoking status half pack per day on a good day but can be as hi as 3/4 ppd on a bad day. No recent illness. Denies CP/palp/SOB/HA/congestion/fevers/GI or GU c/o. Taking meds as prescribed  Past Medical History  Diagnosis Date  . CAD (coronary artery disease)   . Hypertension   . Hyperlipidemia   . Depression   . Chicken pox as a child  . Mumps as a child  . Asthma     childhood  . Allergy     cats  . Anxiety   . Depression 12/25/2012  . Left knee pain 12/25/2012  . 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 Insomnia 06/11/2013    Past Surgical History  Procedure Laterality Date  . Coronary stent placement  01-14-05    Family History  Problem Relation Age of Onset  . Multiple sclerosis Mother   . Cancer Father     esophagus/ brain  . Alcohol abuse Father     smoker  . Seizures Sister   . COPD Sister   . Alcohol abuse Sister     addiction, recovered  . Heart disease Maternal Grandfather     MI    History   Social History  . Marital Status: Legally Separated    Spouse Name: N/A    Number of Children: N/A  . Years of Education: N/A   Occupational History  . Not on file.   Social History Main Topics  . Smoking status: Current Every Day Smoker -- 0.50 packs/day for 20 years    Types: Cigarettes  . Smokeless tobacco: Never Used  . Alcohol Use: Yes     Comment: 5 or 6 drinks a week   . Drug Use: No  . 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 Not on file   Social History Narrative  . No narrative on file    Current Outpatient Prescriptions on File Prior to Visit  Medication Sig Dispense Refill  . amLODipine (NORVASC) 5 MG tablet Take 1 tablet (5 mg total) by mouth daily.  30 tablet  9  . aspirin 81 MG tablet Take 160 mg by mouth daily.      . meloxicam (MOBIC) 15 MG tablet Take 1 tablet (15 mg total) by mouth daily.  30 tablet  0  . Multiple Vitamin (MULTIVITAMIN) capsule Take 1 capsule by mouth daily.      . pravastatin (PRAVACHOL) 80 MG tablet Take one tablet ( 80 mg ) daily  30 tablet  9   No current facility-administered medications on file prior to visit.    Allergies  Allergen Reactions  . Neosporin [Neomycin-Bacitracin Zn-Polymyx] Hives and Itching    Review of Systems  Review of Systems  Constitutional: Negative for fever and malaise/fatigue.  HENT: Negative for congestion.   Eyes: Negative for discharge.  Respiratory: Negative  for shortness of breath.   Cardiovascular: Negative for chest pain, palpitations and leg swelling.  Gastrointestinal: Negative for nausea, abdominal pain and diarrhea.  Genitourinary: Negative for dysuria.  Musculoskeletal: Negative for falls.  Skin: Negative for rash.  Neurological: Negative for loss of consciousness and headaches.  Endo/Heme/Allergies: Negative for polydipsia.  Psychiatric/Behavioral: Negative for depression and suicidal ideas. The patient is not nervous/anxious and does not have insomnia.     Objective  BP 128/78  Pulse 86  Temp(Src) 97.8 F (36.6 C) (Oral)  Ht 6' (1.829 m)  Wt 197 lb (89.359 kg)  BMI 26.71 kg/m2  SpO2 98%  Physical Exam  Physical Exam  Constitutional: He is oriented to person, place, and time and well-developed, well-nourished, and in no distress. No distress.  HENT:  Head: Normocephalic and atraumatic.  Eyes: Conjunctivae are normal.  Neck: Neck supple.  No thyromegaly present.  Cardiovascular: Normal rate, regular rhythm and normal heart sounds.   No murmur heard. Pulmonary/Chest: Effort normal and breath sounds normal. No respiratory distress.  Abdominal: He exhibits no distension and no mass. There is no tenderness.  Musculoskeletal: He exhibits no edema.  Neurological: He is alert and oriented to person, place, and time.  Skin: Skin is warm.  Psychiatric: Memory, affect and judgment normal.    No results found for this basename: TSH   No results found for this basename: WBC, HGB, HCT, MCV, PLT   Lab Results  Component Value Date   CREATININE 0.9 09/13/2007   BUN 11 09/13/2007   NA 138 09/13/2007   K 4.0 09/13/2007   CL 103 09/13/2007   CO2 27 09/13/2007   Lab Results  Component Value Date   ALT 30 01/23/2013   AST 22 01/23/2013   ALKPHOS 66 01/23/2013   BILITOT 0.7 01/23/2013   Lab Results  Component Value Date   CHOL 175 01/23/2013   Lab Results  Component Value Date   HDL 41.50 01/23/2013   Lab Results  Component Value Date   LDLCALC 118* 01/23/2013   Lab Results  Component Value Date   TRIG 76.0 01/23/2013   Lab Results  Component Value Date   CHOLHDL 4 01/23/2013     Assessment & Plan  HYPERTENSION, UNSPECIFIED Well controlled, no changes to meds. Encouraged heart healthy diet such as the DASH diet and exercise as tolerated.   HYPERLIPIDEMIA-MIXED Tolerating statin, encouraged heart healthy diet, avoid trans fats, minimize simple carbs and saturated fats. Increase exercise as tolerated  Insomnia Encouraged good sleep hygiene such as dark, quiet room. No blue/green glowing lights such as computer screens in bedroom. No alcohol or stimulants in evening. Cut down on caffeine as able. Regular exercise is helpful but not just prior to bed time. May use Ambien prn

## 2013-06-11 NOTE — Assessment & Plan Note (Signed)
Well controlled, no changes to meds. Encouraged heart healthy diet such as the DASH diet and exercise as tolerated.  °

## 2013-06-11 NOTE — Assessment & Plan Note (Signed)
Encouraged good sleep hygiene such as dark, quiet room. No blue/green glowing lights such as computer screens in bedroom. No alcohol or stimulants in evening. Cut down on caffeine as able. Regular exercise is helpful but not just prior to bed time.  May use Ambien prn 

## 2013-06-11 NOTE — Assessment & Plan Note (Signed)
Tolerating statin, encouraged heart healthy diet, avoid trans fats, minimize simple carbs and saturated fats. Increase exercise as tolerated 

## 2013-07-28 ENCOUNTER — Encounter: Payer: Self-pay | Admitting: Cardiology

## 2013-09-14 ENCOUNTER — Telehealth: Payer: Self-pay | Admitting: *Deleted

## 2013-09-14 ENCOUNTER — Telehealth: Payer: Self-pay | Admitting: Family Medicine

## 2013-09-14 DIAGNOSIS — Z125 Encounter for screening for malignant neoplasm of prostate: Secondary | ICD-10-CM

## 2013-09-14 DIAGNOSIS — E785 Hyperlipidemia, unspecified: Secondary | ICD-10-CM

## 2013-09-14 DIAGNOSIS — I10 Essential (primary) hypertension: Secondary | ICD-10-CM

## 2013-09-14 DIAGNOSIS — Z79899 Other long term (current) drug therapy: Secondary | ICD-10-CM

## 2013-09-14 LAB — CBC WITH DIFFERENTIAL/PLATELET
BASOS PCT: 1 % (ref 0–1)
Basophils Absolute: 0.1 10*3/uL (ref 0.0–0.1)
Eosinophils Absolute: 0.4 10*3/uL (ref 0.0–0.7)
Eosinophils Relative: 6 % — ABNORMAL HIGH (ref 0–5)
HCT: 43 % (ref 39.0–52.0)
Hemoglobin: 14.8 g/dL (ref 13.0–17.0)
Lymphocytes Relative: 26 % (ref 12–46)
Lymphs Abs: 1.7 10*3/uL (ref 0.7–4.0)
MCH: 30.5 pg (ref 26.0–34.0)
MCHC: 34.4 g/dL (ref 30.0–36.0)
MCV: 88.5 fL (ref 78.0–100.0)
Monocytes Absolute: 0.8 10*3/uL (ref 0.1–1.0)
Monocytes Relative: 12 % (ref 3–12)
NEUTROS ABS: 3.7 10*3/uL (ref 1.7–7.7)
NEUTROS PCT: 55 % (ref 43–77)
PLATELETS: 255 10*3/uL (ref 150–400)
RBC: 4.86 MIL/uL (ref 4.22–5.81)
RDW: 13.5 % (ref 11.5–15.5)
WBC: 6.7 10*3/uL (ref 4.0–10.5)

## 2013-09-14 LAB — BASIC METABOLIC PANEL
BUN: 14 mg/dL (ref 6–23)
CALCIUM: 9.8 mg/dL (ref 8.4–10.5)
CO2: 26 mEq/L (ref 19–32)
CREATININE: 0.77 mg/dL (ref 0.50–1.35)
Chloride: 106 mEq/L (ref 96–112)
GLUCOSE: 92 mg/dL (ref 70–99)
Potassium: 4.2 mEq/L (ref 3.5–5.3)
Sodium: 139 mEq/L (ref 135–145)

## 2013-09-14 LAB — HEPATIC FUNCTION PANEL
ALK PHOS: 67 U/L (ref 39–117)
ALT: 30 U/L (ref 0–53)
AST: 23 U/L (ref 0–37)
Albumin: 4.6 g/dL (ref 3.5–5.2)
BILIRUBIN INDIRECT: 0.4 mg/dL (ref 0.2–1.2)
Bilirubin, Direct: 0.2 mg/dL (ref 0.0–0.3)
TOTAL PROTEIN: 7.4 g/dL (ref 6.0–8.3)
Total Bilirubin: 0.6 mg/dL (ref 0.2–1.2)

## 2013-09-14 LAB — TSH: TSH: 2.544 u[IU]/mL (ref 0.350–4.500)

## 2013-09-14 LAB — LIPID PANEL
CHOL/HDL RATIO: 4.7 ratio
Cholesterol: 173 mg/dL (ref 0–200)
HDL: 37 mg/dL — AB (ref >39)
LDL Cholesterol: 112 mg/dL — ABNORMAL HIGH (ref 0–99)
TRIGLYCERIDES: 121 mg/dL (ref <150)
VLDL: 24 mg/dL (ref 0–40)

## 2013-09-14 NOTE — Telephone Encounter (Signed)
Ivin Booty ordered

## 2013-09-14 NOTE — Telephone Encounter (Signed)
Patient waiting, Lab orders placed for [facility]

## 2013-09-14 NOTE — Telephone Encounter (Signed)
Lab order for today Return for annual exam, lipid, renal, cbc, tsh, hepatic prior. Diagnostic follow-up: Instructions: Check out comments: With PSA has check up in August, needs labs prior

## 2013-09-15 LAB — PSA: PSA: 0.63 ng/mL (ref <=4.00)

## 2013-09-19 ENCOUNTER — Encounter: Payer: Self-pay | Admitting: Family Medicine

## 2013-09-19 ENCOUNTER — Telehealth: Payer: Self-pay | Admitting: Family Medicine

## 2013-09-19 ENCOUNTER — Ambulatory Visit (INDEPENDENT_AMBULATORY_CARE_PROVIDER_SITE_OTHER): Payer: Managed Care, Other (non HMO) | Admitting: Family Medicine

## 2013-09-19 VITALS — BP 114/74 | HR 67 | Temp 97.8°F | Ht 72.0 in | Wt 189.0 lb

## 2013-09-19 DIAGNOSIS — Z720 Tobacco use: Secondary | ICD-10-CM

## 2013-09-19 DIAGNOSIS — E785 Hyperlipidemia, unspecified: Secondary | ICD-10-CM

## 2013-09-19 DIAGNOSIS — R109 Unspecified abdominal pain: Secondary | ICD-10-CM

## 2013-09-19 DIAGNOSIS — I10 Essential (primary) hypertension: Secondary | ICD-10-CM

## 2013-09-19 DIAGNOSIS — F17200 Nicotine dependence, unspecified, uncomplicated: Secondary | ICD-10-CM

## 2013-09-19 DIAGNOSIS — N200 Calculus of kidney: Secondary | ICD-10-CM

## 2013-09-19 DIAGNOSIS — G4700 Insomnia, unspecified: Secondary | ICD-10-CM

## 2013-09-19 DIAGNOSIS — Z0000 Encounter for general adult medical examination without abnormal findings: Secondary | ICD-10-CM

## 2013-09-19 DIAGNOSIS — I2510 Atherosclerotic heart disease of native coronary artery without angina pectoris: Secondary | ICD-10-CM

## 2013-09-19 HISTORY — DX: Calculus of kidney: N20.0

## 2013-09-19 NOTE — Assessment & Plan Note (Signed)
Asymptomatic, continue with annual cardiology visit, quit smoking, continue heart healthy diet and exercise

## 2013-09-19 NOTE — Assessment & Plan Note (Signed)
Encouraged complete cessation. Discussed need to quit as relates to risk of numerous cancers, cardiac and pulmonary disease as well as neurologic complications. Counseled for greater than 3 minutes. Wellbutrin has helped cravings some. Marland Kitchen

## 2013-09-19 NOTE — Assessment & Plan Note (Signed)
Tolerating statin, encouraged heart healthy diet, avoid trans fats, minimize simple carbs and saturated fats. Increase exercise as tolerated 

## 2013-09-19 NOTE — Progress Notes (Signed)
Pre visit review using our clinic review tool, if applicable. No additional management support is needed unless otherwise documented below in the visit note. 

## 2013-09-19 NOTE — Progress Notes (Signed)
Patient ID: Dakota [name], male   DOB: 04-25-1960, 53 y.o.   MRN: [id number] TAVITA EASTHAM [id number] Mar 30, 1960 09/19/2013      Progress Note-Follow Up  Subjective  Chief Complaint  Chief Complaint  Patient presents with  . Annual Exam    physical    HPI  Patient is a 53 year old male in today for routine medical care.  Doing well. Unfortunately continues to smoke half-pack per day. Has been struggling with some intermittent pain in his left side especially with movement. No other complaints. No recent illness. Denies CP/palp/SOB/HA/congestion/fevers/GI or GU c/o. Taking meds as prescribed   Past Medical History  Diagnosis Date  . CAD (coronary artery disease)   . Hypertension   . Hyperlipidemia   . Depression   . Chicken pox as a child  . Mumps as a child  . Asthma     childhood  . Allergy     cats  . Anxiety   . Depression 12/25/2012  . Left knee pain 12/25/2012  . 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 Insomnia 06/11/2013    Past Surgical History  Procedure Laterality Date  . Coronary stent placement  01-14-05    Family History  Problem Relation Age of Onset  . Multiple sclerosis Mother   . Cancer Father     esophagus/ brain  . Alcohol abuse Father     smoker  . Seizures Sister   . COPD Sister   . Alcohol abuse Sister     addiction, recovered  . Heart disease Maternal Grandfather     MI    History   Social History  . Marital Status: Legally Separated    Spouse Name: N/A    Number of Children: N/A  . Years of Education: N/A   Occupational History  . Not on file.   Social History Main Topics  . Smoking status: Current Every Day Smoker -- 0.50 packs/day for 20 years    Types: Cigarettes  . Smokeless tobacco: Never Used  . Alcohol Use: Yes     Comment: 5 or 6 drinks a week   . Drug Use: No  . 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 Not  on file   Social History Narrative  . No narrative on file    Current Outpatient Prescriptions on File Prior to Visit  Medication Sig Dispense Refill  . amLODipine (NORVASC) 5 MG tablet Take 1 tablet (5 mg total) by mouth daily.  30 tablet  9  . aspirin 81 MG tablet Take 160 mg by mouth daily.      Marland Kitchen buPROPion (WELLBUTRIN XL) 300 MG 24 hr tablet Take 1 tablet (300 mg total) by mouth daily.  30 tablet  5  . meloxicam (MOBIC) 15 MG tablet Take 1 tablet (15 mg total) by mouth daily.  30 tablet  0  . pravastatin (PRAVACHOL) 80 MG tablet Take one tablet ( 80 mg ) daily  30 tablet  9  . zolpidem (AMBIEN) 10 MG tablet Take 1 tablet (10 mg total) by mouth at bedtime as needed for sleep.  30 tablet  5  . Multiple Vitamin (MULTIVITAMIN) capsule Take 1 capsule by mouth daily.       No current facility-administered medications on file prior to visit.    Allergies  Allergen Reactions  . Neosporin [Neomycin-Bacitracin Zn-Polymyx] Hives  and Itching    Review of Systems  Review of Systems  Constitutional: Negative for fever and malaise/fatigue.  HENT: Negative for congestion.   Eyes: Negative for discharge.  Respiratory: Negative for shortness of breath.   Cardiovascular: Negative for chest pain, palpitations and leg swelling.  Gastrointestinal: Negative.  Negative for nausea, abdominal pain and diarrhea.  Genitourinary: Negative.  Negative for dysuria.  Musculoskeletal: Negative.  Negative for falls.  Skin: Negative for rash.  Neurological: Negative for loss of consciousness and headaches.  Endo/Heme/Allergies: Negative for polydipsia.  Psychiatric/Behavioral: Negative for depression and suicidal ideas. The patient is not nervous/anxious and does not have insomnia.     Objective  BP 114/74  Pulse 67  Temp(Src) 97.8 F (36.6 C) (Oral)  Ht 6' (1.829 m)  Wt 189 lb (85.73 kg)  BMI 25.63 kg/m2  SpO2 97%  Physical Exam  Physical Exam  Constitutional: He is oriented to person, place,  and time and well-developed, well-nourished, and in no distress. No distress.  HENT:  Head: Normocephalic and atraumatic.  Eyes: Conjunctivae are normal.  Neck: Neck supple. No thyromegaly present.  Cardiovascular: Normal rate, regular rhythm and normal heart sounds.   No murmur heard. Pulmonary/Chest: Effort normal and breath sounds normal. No respiratory distress.  Abdominal: Soft. Bowel sounds are normal. He exhibits no distension and no mass. There is no tenderness.  Musculoskeletal: He exhibits no edema.  Neurological: He is alert and oriented to person, place, and time.  Skin: Skin is warm.  Psychiatric: Memory, affect and judgment normal.    Lab Results  Component Value Date   TSH 2.544 09/14/2013   Lab Results  Component Value Date   WBC 6.7 09/14/2013   HGB 14.8 09/14/2013   HCT 43.0 09/14/2013   MCV 88.5 09/14/2013   PLT 255 09/14/2013   Lab Results  Component Value Date   CREATININE 0.77 09/14/2013   BUN 14 09/14/2013   NA 139 09/14/2013   K 4.2 09/14/2013   CL 106 09/14/2013   CO2 26 09/14/2013   Lab Results  Component Value Date   ALT 30 09/14/2013   AST 23 09/14/2013   ALKPHOS 67 09/14/2013   BILITOT 0.6 09/14/2013   Lab Results  Component Value Date   CHOL 173 09/14/2013   Lab Results  Component Value Date   HDL 37* 09/14/2013   Lab Results  Component Value Date   LDLCALC 112* 09/14/2013   Lab Results  Component Value Date   TRIG 121 09/14/2013   Lab Results  Component Value Date   CHOLHDL 4.7 09/14/2013     Assessment & Plan  HYPERTENSION, UNSPECIFIED Well controlled, no changes to meds. Encouraged heart healthy diet such as the DASH diet and exercise as tolerated.   CAD, NATIVE VESSEL Asymptomatic, continue with annual cardiology visit, quit smoking, continue heart healthy diet and exercise  HYPERLIPIDEMIA-MIXED Tolerating statin, encouraged heart healthy diet, avoid trans fats, minimize simple carbs and saturated fats. Increase exercise as  tolerated  Left flank pain Occurs daily, better when he lies on his left side, suspicious for strictures and bowel disease or musculoskeletal process. Encouraged stretching, fiber, fluids and report worsening symptoms. Call for Ultrasound or further imaging if  No better.  Tobacco abuse Encouraged complete cessation. Discussed need to quit as relates to risk of numerous cancers, cardiac and pulmonary disease as well as neurologic complications. Counseled for greater than 3 minutes. Wellbutrin has helped cravings some. .  Insomnia Using 1/2 an Ambien prn with good  results. Encouraged good sleep hygiene such as dark, quiet room. No blue/green glowing lights such as computer screens in bedroom. No alcohol or stimulants in evening. Cut down on caffeine as able. Regular exercise is helpful but not just prior to bed time.

## 2013-09-19 NOTE — Assessment & Plan Note (Signed)
Occurs daily, better when he lies on his left side, suspicious for strictures and bowel disease or musculoskeletal process. Encouraged stretching, fiber, fluids and report worsening symptoms. Call for Ultrasound or further imaging if  No better.

## 2013-09-19 NOTE — Patient Instructions (Addendum)
Consider Krill oil caps such as MegaRed daily  PSA with next labs  Cholesterol Cholesterol is a white, waxy, fat-like substance needed by your body in small amounts. The liver makes all the cholesterol you need. Cholesterol is carried from the liver by the blood through the blood vessels. Deposits of cholesterol (plaque) may build up on blood vessel walls. These make the arteries narrower and stiffer. Cholesterol plaques increase the risk for heart attack and stroke.  You cannot feel your cholesterol level even if it is very high. The only way to know it is high is with a blood test. Once you know your cholesterol levels, you should keep a record of the test results. Work with your health care provider to keep your levels in the desired range.  WHAT DO THE RESULTS MEAN?  Total cholesterol is a rough measure of all the cholesterol in your blood.   LDL is the so-called bad cholesterol. This is the type that deposits cholesterol in the walls of the arteries. You want this level to be low.   HDL is the good cholesterol because it cleans the arteries and carries the LDL away. You want this level to be high.  Triglycerides are fat that the body can either burn for energy or store. High levels are closely linked to heart disease.  WHAT ARE THE DESIRED LEVELS OF CHOLESTEROL?  Total cholesterol below 200.   LDL below 100 for people at risk, below 70 for those at very high risk.   HDL above 50 is good, above 60 is best.   Triglycerides below 150.  HOW CAN I LOWER MY CHOLESTEROL?  Diet. Follow your diet programs as directed by your health care provider.   Choose fish or white meat chicken and [place], roasted or baked. Limit fatty cuts of red meat, fried foods, and processed meats, such as sausage and lunch meats.   Eat lots of fresh fruits and vegetables.  Choose whole grains, beans, pasta, potatoes, and cereals.   Use only small amounts of olive, corn, or canola oils.   Avoid  butter, mayonnaise, shortening, or palm kernel oils.  Avoid foods with trans fats.   Drink skim or nonfat milk and eat low-fat or nonfat yogurt and cheeses. Avoid whole milk, cream, ice cream, egg yolks, and full-fat cheeses.   Healthy desserts include angel food cake, ginger snaps, animal crackers, hard candy, popsicles, and low-fat or nonfat frozen yogurt. Avoid pastries, cakes, pies, and cookies.   Exercise. Follow your exercise programs as directed by your health care provider.   A regular program helps decrease LDL and raise HDL.   A regular program helps with weight control.   Do things that increase your activity level like gardening, walking, or taking the stairs. Ask your health care provider about how you can be more active in your daily life.   Medicine. Take medicine only as directed by your health care provider.   Medicine may be prescribed by your health care provider to help lower cholesterol and decrease the risk for heart disease.   If you have several risk factors, you may need medicine even if your levels are normal. Document Released: 10/14/2000 Document Revised: 06/05/2013 Document Reviewed: 11/02/2012 [facility] Patient Information 2015 [place], [place]. This information is not intended to replace advice given to you by your health care provider. Make sure you discuss any questions you have with your health care provider.

## 2013-09-19 NOTE — Telephone Encounter (Signed)
LABORDER WEEK OF 09-14-2014 LIPID RENAL CBC TSH HEPATIC

## 2013-09-19 NOTE — Assessment & Plan Note (Signed)
Using 1/2 an Ambien prn with good results. Encouraged good sleep hygiene such as dark, quiet room. No blue/green glowing lights such as computer screens in bedroom. No alcohol or stimulants in evening. Cut down on caffeine as able. Regular exercise is helpful but not just prior to bed time.

## 2013-09-19 NOTE — Assessment & Plan Note (Signed)
Well controlled, no changes to meds. Encouraged heart healthy diet such as the DASH diet and exercise as tolerated.  °

## 2013-09-20 ENCOUNTER — Telehealth: Payer: Self-pay | Admitting: Family Medicine

## 2013-09-20 NOTE — Telephone Encounter (Signed)
Relevant patient education assigned to patient using Emmi. ° °

## 2013-09-24 ENCOUNTER — Encounter: Payer: Self-pay | Admitting: Family Medicine

## 2013-09-24 DIAGNOSIS — Z0000 Encounter for general adult medical examination without abnormal findings: Secondary | ICD-10-CM | POA: Insufficient documentation

## 2013-09-24 HISTORY — DX: Encounter for general adult medical examination without abnormal findings: Z00.00

## 2013-09-24 NOTE — Assessment & Plan Note (Signed)
Patient encouraged to maintain heart healthy diet, regular exercise, adequate sleep. Consider daily probiotics. Take medications as prescribed 

## 2013-11-02 ENCOUNTER — Telehealth: Payer: Self-pay | Admitting: *Deleted

## 2013-11-02 NOTE — Telephone Encounter (Signed)
Patient dropped off health screening form. Form filled out as much as possible and forwarded to Dr. Charlett Blake for review and signature. JG//CMA

## 2013-11-06 NOTE — Telephone Encounter (Signed)
Received completed form. Pt aware that copy is up front for him. Form faxed to [employer] at [phone] and copy sent for scanning. JG//CMA

## 2013-12-18 ENCOUNTER — Other Ambulatory Visit: Payer: Self-pay | Admitting: Cardiology

## 2013-12-19 ENCOUNTER — Other Ambulatory Visit: Payer: Self-pay | Admitting: Cardiology

## 2013-12-20 ENCOUNTER — Other Ambulatory Visit: Payer: Self-pay | Admitting: Cardiology

## 2014-01-01 ENCOUNTER — Telehealth: Payer: Self-pay | Admitting: Family Medicine

## 2014-01-01 NOTE — Telephone Encounter (Signed)
Caller name:Camberos, Creg A Relation to OI:BBCW  Call back number: [phone] [facility]: [facility] [phone]  Reason for call:  Requesting a refill zolpidem (AMBIEN) 10 MG tablet  & buPROPion (WELLBUTRIN XL) 300 MG 24 hr tablet. Pt

## 2014-01-02 MED ORDER — ZOLPIDEM TARTRATE 10 MG PO TABS: 10.0000 mg | ORAL_TABLET | Freq: Every evening | ORAL | Status: DC | PRN

## 2014-01-02 NOTE — Telephone Encounter (Signed)
RX printed for md to sign and fax.  Last RX was done on 06-09-13 quantity 30 with 5 refills

## 2014-01-12 NOTE — Progress Notes (Signed)
      HPI: FU coronary artery disease. 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 Cath revealed normal LM. Left anterior descending artery had 30% lesion in the proximal and mid-vessel. Lcx with 60 % lesion. There were left-to-right collaterals to the RCA. Right coronary artery was dominant and was 100% midvessel occlusion after the takeoff of a medium-sized RV branch; inferobasal wall akinesis. EF was 50%. He underwent PCI of the right coronary artery with bare-metal stent. EF was 50%. Stress echocardiogram in February 2013 was normal. Carotid Dopplers in Dec 2014 showed 40-59% right and 0-39% left stenosis. Followup recommended in two years. Since last seen, the patient denies any dyspnea on exertion, orthopnea, PND, pedal edema, palpitations, syncope or chest pain.   Current Outpatient Prescriptions  Medication Sig Dispense Refill  . amLODipine (NORVASC) 5 MG tablet TAKE 1 TABLET DAILY 30 tablet 0  . aspirin 81 MG tablet Take 160 mg by mouth daily.    Marland Kitchen buPROPion (WELLBUTRIN XL) 300 MG 24 hr tablet Take 1 tablet (300 mg total) by mouth daily. 30 tablet 5  . Multiple Vitamin (MULTIVITAMIN) capsule Take 1 capsule by mouth daily.    . pravastatin (PRAVACHOL) 80 MG tablet Take one tablet ( 80 mg ) daily 30 tablet 9  . zolpidem (AMBIEN) 10 MG tablet Take 1 tablet (10 mg total) by mouth at bedtime as needed for sleep. 30 tablet 2   No current facility-administered medications for this visit.     Past Medical History  Diagnosis Date  . CAD (coronary artery disease)   . Hypertension   . Hyperlipidemia   . Depression   . Chicken pox as a child  . Mumps as a child  . Asthma     childhood  . Allergy     cats  . Anxiety   . Depression 12/25/2012  . Left knee pain 12/25/2012  . 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 Insomnia 06/11/2013  . Preventative health care 09/24/2013    Past Surgical History  Procedure Laterality Date  . Coronary stent placement   01-14-05    History   Social History  . Marital Status: Legally Separated    Spouse Name: N/A    Number of Children: N/A  . Years of Education: N/A   Occupational History  . Not on file.   Social History Main Topics  . Smoking status: Current Every Day Smoker -- 0.50 packs/day for 20 years    Types: Cigarettes  . Smokeless tobacco: Never Used  . Alcohol Use: Yes     Comment: 5 or 6 drinks a week   . Drug Use: No  . 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 Not on file   Social History Narrative    ROS: no fevers or chills, productive cough, hemoptysis, dysphasia, odynophagia, melena, hematochezia, dysuria, hematuria, rash, seizure activity, orthopnea, PND, pedal edema, claudication. Remaining systems are negative.  Physical Exam: Well-developed well-nourished in no acute distress.  Skin is warm and dry.  HEENT is normal.  Neck is supple.  Chest is clear to auscultation with normal expansion.  Cardiovascular exam is regular rate and rhythm.  Abdominal exam nontender or distended. No masses palpated. Extremities show no edema. neuro grossly intact  ECG sinus rhythm at a rate of 68.left atrial enlargement.

## 2014-01-15 ENCOUNTER — Encounter: Payer: Self-pay | Admitting: Cardiology

## 2014-01-15 ENCOUNTER — Ambulatory Visit (INDEPENDENT_AMBULATORY_CARE_PROVIDER_SITE_OTHER): Payer: Managed Care, Other (non HMO) | Admitting: Cardiology

## 2014-01-15 ENCOUNTER — Encounter: Payer: Self-pay | Admitting: *Deleted

## 2014-01-15 VITALS — BP 110/80 | HR 68 | Ht 72.0 in | Wt 194.9 lb

## 2014-01-15 DIAGNOSIS — I2510 Atherosclerotic heart disease of native coronary artery without angina pectoris: Secondary | ICD-10-CM

## 2014-01-15 DIAGNOSIS — E785 Hyperlipidemia, unspecified: Secondary | ICD-10-CM

## 2014-01-15 DIAGNOSIS — I679 Cerebrovascular disease, unspecified: Secondary | ICD-10-CM

## 2014-01-15 MED ORDER — AMLODIPINE BESYLATE 5 MG PO TABS: 5.0000 mg | ORAL_TABLET | Freq: Every day | ORAL | Status: DC

## 2014-01-15 MED ORDER — ATORVASTATIN CALCIUM 80 MG PO TABS
80.0000 mg | ORAL_TABLET | Freq: Every day | ORAL | Status: DC
Start: 2014-01-15 — End: 2014-02-12

## 2014-01-15 NOTE — Assessment & Plan Note (Signed)
Last LDL was not at goal. Discontinue Pravachol. Begin Lipitor 80 mg daily. Check lipids, liver and CK in 6 weeks.

## 2014-01-15 NOTE — Assessment & Plan Note (Signed)
Continue aspirin and statin. Scheduled follow-up carotid Dopplers.

## 2014-01-15 NOTE — Patient Instructions (Signed)
Your physician wants you to follow-up in: [place] will receive a reminder letter in the mail two months in advance. If you don't receive a letter, please call our office to schedule the follow-up appointment.   STOP PRAVASTATIN  START ATORVASTATIN 80 MG ONCE DAILY  Your physician recommends that you return for lab work in: [place] ATORVASTATIN  Your physician has requested that you have a carotid duplex. This test is an ultrasound of the carotid arteries in your neck. It looks at blood flow through these arteries that supply the brain with blood. Allow one hour for this exam. There are no restrictions or special instructions.

## 2014-01-15 NOTE — Assessment & Plan Note (Signed)
Patient counseled on discontinuing. 

## 2014-01-15 NOTE — Assessment & Plan Note (Signed)
Blood pressure controlled. Continue present medications. 

## 2014-01-15 NOTE — Assessment & Plan Note (Signed)
Continue aspirin and statin. 

## 2014-01-22 ENCOUNTER — Other Ambulatory Visit: Payer: Self-pay | Admitting: Family Medicine

## 2014-01-31 ENCOUNTER — Other Ambulatory Visit: Payer: Self-pay | Admitting: *Deleted

## 2014-01-31 MED ORDER — BUPROPION HCL ER (XL) 300 MG PO TB24: ORAL_TABLET | ORAL | Status: DC

## 2014-01-31 NOTE — Telephone Encounter (Signed)
For cost effectiveness, pt would like 90 day supply of bupropion. E-scribed to [facility]. JG//CMA

## 2014-02-05 ENCOUNTER — Ambulatory Visit (HOSPITAL_COMMUNITY)
Admission: RE | Admit: 2014-02-05 | Discharge: 2014-02-05 | Disposition: A | Discharge status: Home / Self Care | Payer: Managed Care, Other (non HMO) | Source: Ambulatory Visit | Attending: Cardiovascular Disease | Admitting: Cardiovascular Disease

## 2014-02-05 DIAGNOSIS — I679 Cerebrovascular disease, unspecified: Secondary | ICD-10-CM | POA: Insufficient documentation

## 2014-02-05 NOTE — Progress Notes (Signed)
Carotid Duplex Completed. Kingston Shawgo, BS, RDMS, RVT  

## 2014-02-12 ENCOUNTER — Other Ambulatory Visit: Payer: Self-pay

## 2014-02-12 DIAGNOSIS — E785 Hyperlipidemia, unspecified: Secondary | ICD-10-CM

## 2014-02-12 MED ORDER — AMLODIPINE BESYLATE 5 MG PO TABS: 5.0000 mg | ORAL_TABLET | Freq: Every day | ORAL | Status: DC

## 2014-02-12 MED ORDER — ATORVASTATIN CALCIUM 80 MG PO TABS: 80.0000 mg | ORAL_TABLET | Freq: Every day | ORAL | Status: DC

## 2014-02-12 NOTE — Telephone Encounter (Signed)
Rx sent to pharmacy   

## 2014-02-16 ENCOUNTER — Telehealth: Payer: Self-pay | Admitting: Family Medicine

## 2014-02-16 NOTE — Telephone Encounter (Signed)
Please advise      KP 

## 2014-02-16 NOTE — Telephone Encounter (Signed)
Caller name: Olof Relation to pt: self Call back number: [phone] Pharmacy:  Reason for call:   Patient states that he is still having side pain and would like additional testing. He states that he discussed this with Dr. Charlett Blake at last visit. I informed him that he would most likely need an appointment but wanted to ask Dr. Charlett Blake if she would order a test first.

## 2014-02-18 NOTE — Telephone Encounter (Signed)
Unfortunately it is too long since his last appt in August. Even if I wanted to order test would just be guessing what is the best test and I do not believe insurance will not pay for any imaging without a visit. He will likely needs lab work and vital signs done as well

## 2014-02-19 NOTE — Telephone Encounter (Signed)
Left detailed voice mail explaining the recommendations.

## 2014-02-20 ENCOUNTER — Encounter: Payer: Self-pay | Admitting: Family Medicine

## 2014-02-20 ENCOUNTER — Ambulatory Visit (INDEPENDENT_AMBULATORY_CARE_PROVIDER_SITE_OTHER): Payer: Managed Care, Other (non HMO) | Admitting: Family Medicine

## 2014-02-20 VITALS — BP 125/83 | HR 67 | Temp 98.0°F | Ht 72.0 in | Wt 193.4 lb

## 2014-02-20 DIAGNOSIS — R109 Unspecified abdominal pain: Secondary | ICD-10-CM

## 2014-02-20 DIAGNOSIS — R1012 Left upper quadrant pain: Secondary | ICD-10-CM

## 2014-02-20 DIAGNOSIS — R634 Abnormal weight loss: Secondary | ICD-10-CM

## 2014-02-20 DIAGNOSIS — Z23 Encounter for immunization: Secondary | ICD-10-CM

## 2014-02-20 DIAGNOSIS — I10 Essential (primary) hypertension: Secondary | ICD-10-CM

## 2014-02-20 DIAGNOSIS — F17200 Nicotine dependence, unspecified, uncomplicated: Secondary | ICD-10-CM

## 2014-02-20 DIAGNOSIS — Z720 Tobacco use: Secondary | ICD-10-CM

## 2014-02-20 DIAGNOSIS — E782 Mixed hyperlipidemia: Secondary | ICD-10-CM

## 2014-02-20 NOTE — Progress Notes (Signed)
Pre visit review using our clinic review tool, if applicable. No additional management support is needed unless otherwise documented below in the visit note. 

## 2014-02-21 ENCOUNTER — Other Ambulatory Visit (INDEPENDENT_AMBULATORY_CARE_PROVIDER_SITE_OTHER): Payer: Managed Care, Other (non HMO)

## 2014-02-21 ENCOUNTER — Other Ambulatory Visit: Payer: Managed Care, Other (non HMO)

## 2014-02-21 DIAGNOSIS — R1012 Left upper quadrant pain: Secondary | ICD-10-CM

## 2014-02-21 DIAGNOSIS — R634 Abnormal weight loss: Secondary | ICD-10-CM

## 2014-02-21 DIAGNOSIS — F17200 Nicotine dependence, unspecified, uncomplicated: Secondary | ICD-10-CM

## 2014-02-21 DIAGNOSIS — Z720 Tobacco use: Secondary | ICD-10-CM

## 2014-02-21 LAB — LIPASE: Lipase: 7 U/L — ABNORMAL LOW (ref 11.0–59.0)

## 2014-02-21 LAB — CBC
HEMATOCRIT: 43.5 % (ref 39.0–52.0)
Hemoglobin: 14.8 g/dL (ref 13.0–17.0)
MCHC: 33.9 g/dL (ref 30.0–36.0)
MCV: 89.3 fl (ref 78.0–100.0)
Platelets: 242 10*3/uL (ref 150.0–400.0)
RBC: 4.87 Mil/uL (ref 4.22–5.81)
RDW: 12.9 % (ref 11.5–15.5)
WBC: 7.3 10*3/uL (ref 4.0–10.5)

## 2014-02-21 LAB — COMPREHENSIVE METABOLIC PANEL
ALT: 31 U/L (ref 0–53)
AST: 20 U/L (ref 0–37)
Albumin: 4.4 g/dL (ref 3.5–5.2)
Alkaline Phosphatase: 88 U/L (ref 39–117)
BUN: 21 mg/dL (ref 6–23)
CHLORIDE: 105 meq/L (ref 96–112)
CO2: 29 meq/L (ref 19–32)
Calcium: 9.6 mg/dL (ref 8.4–10.5)
Creatinine, Ser: 0.81 mg/dL (ref 0.40–1.50)
GFR: 105.62 mL/min (ref >60.00)
Glucose, Bld: 152 mg/dL — ABNORMAL HIGH (ref 70–99)
POTASSIUM: 3.9 meq/L (ref 3.5–5.1)
Sodium: 138 mEq/L (ref 135–145)
TOTAL PROTEIN: 7.3 g/dL (ref 6.0–8.3)
Total Bilirubin: 0.6 mg/dL (ref 0.2–1.2)

## 2014-02-21 LAB — TSH: TSH: 1.73 u[IU]/mL (ref 0.35–4.50)

## 2014-02-21 LAB — SEDIMENTATION RATE: SED RATE: 9 mm/h (ref 0–22)

## 2014-02-21 LAB — AMYLASE: Amylase: 23 U/L — ABNORMAL LOW (ref 27–131)

## 2014-02-22 ENCOUNTER — Other Ambulatory Visit: Payer: Self-pay | Admitting: Family Medicine

## 2014-02-22 ENCOUNTER — Ambulatory Visit (HOSPITAL_BASED_OUTPATIENT_CLINIC_OR_DEPARTMENT_OTHER)
Admission: RE | Admit: 2014-02-22 | Discharge: 2014-02-22 | Disposition: A | Discharge status: Home / Self Care | Payer: Managed Care, Other (non HMO) | Source: Ambulatory Visit | Attending: Family Medicine | Admitting: Family Medicine

## 2014-02-22 DIAGNOSIS — N200 Calculus of kidney: Secondary | ICD-10-CM

## 2014-02-22 DIAGNOSIS — F17200 Nicotine dependence, unspecified, uncomplicated: Secondary | ICD-10-CM

## 2014-02-22 DIAGNOSIS — R1012 Left upper quadrant pain: Secondary | ICD-10-CM | POA: Diagnosis present

## 2014-02-22 DIAGNOSIS — R634 Abnormal weight loss: Secondary | ICD-10-CM | POA: Insufficient documentation

## 2014-02-22 DIAGNOSIS — N1330 Unspecified hydronephrosis: Secondary | ICD-10-CM

## 2014-02-22 IMAGING — US US ABDOMEN COMPLETE
1 series · 14 of 25 positions shown · non-contrast
Comparison: None.

CLINICAL DATA: Left upper quadrant pain and weight loss for 1 year

EXAM:
ULTRASOUND ABDOMEN COMPLETE

[Series 1: us abdomen complete · 0.29mm/px · 14 of 75 slices shown]
[im 1/75]
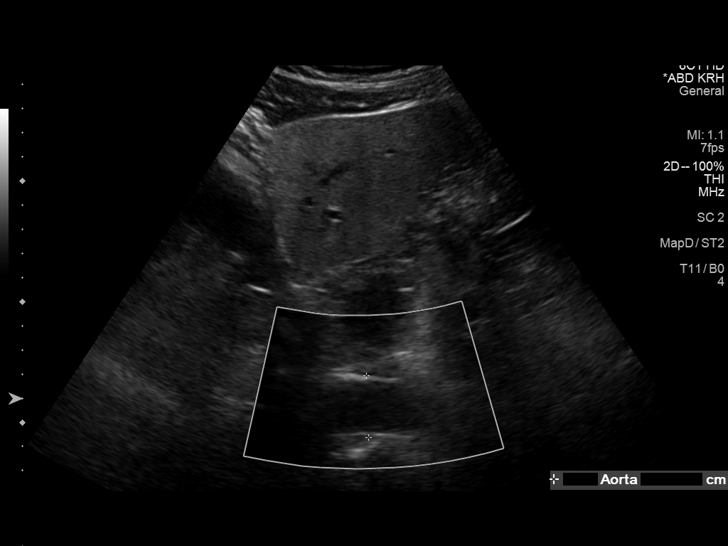
[im 7/75]
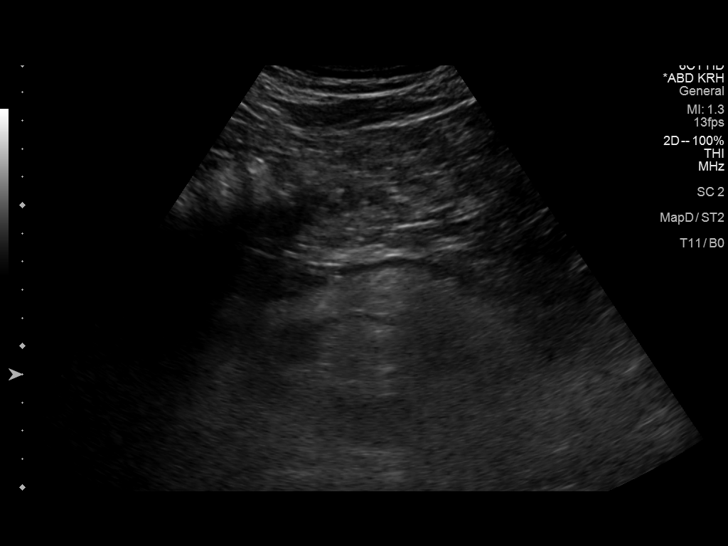
[im 13/75]
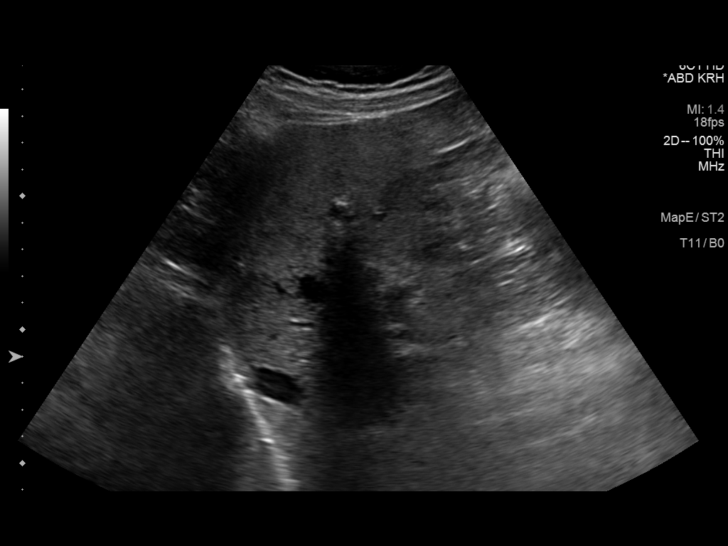
[im 19/75]
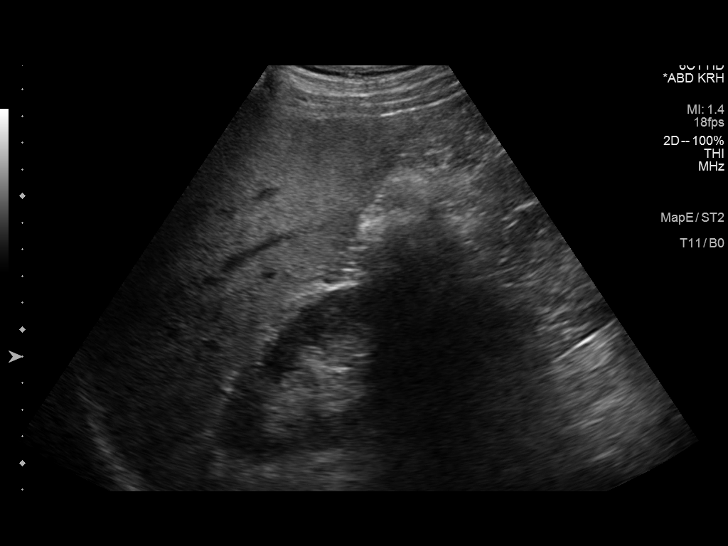
[im 25/75]
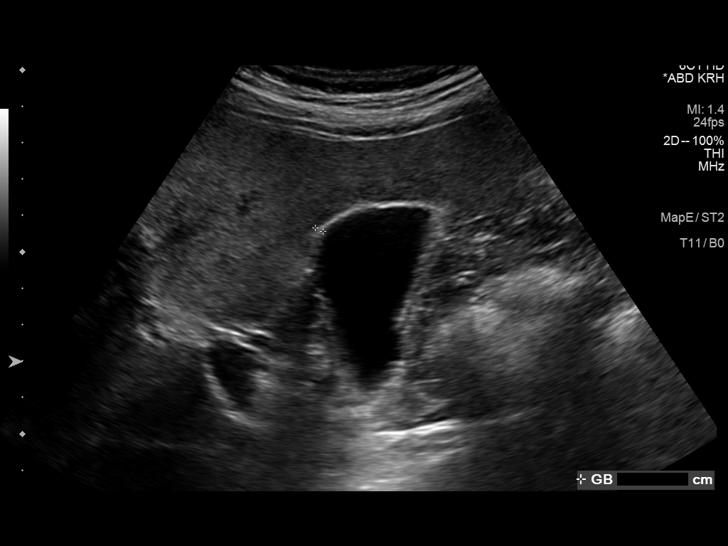
[im 28/75]
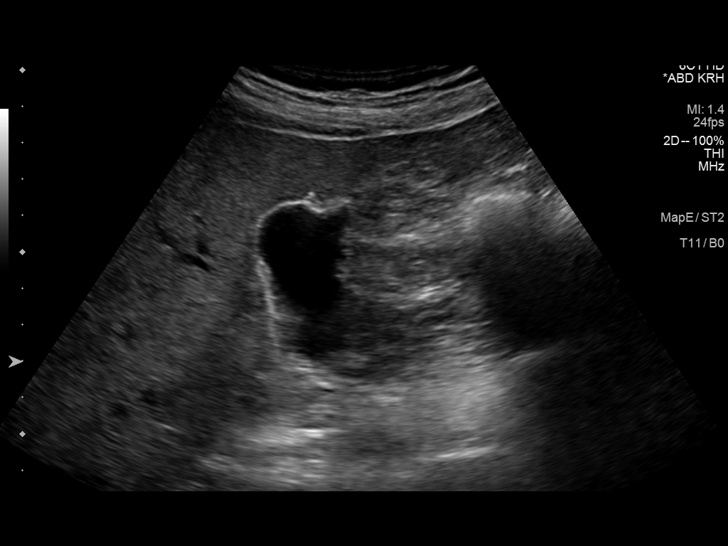
[im 34/75]
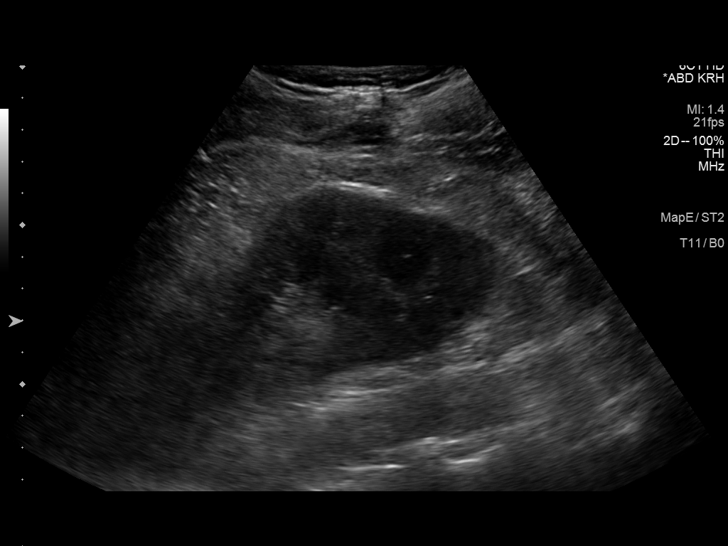
[im 41/75]
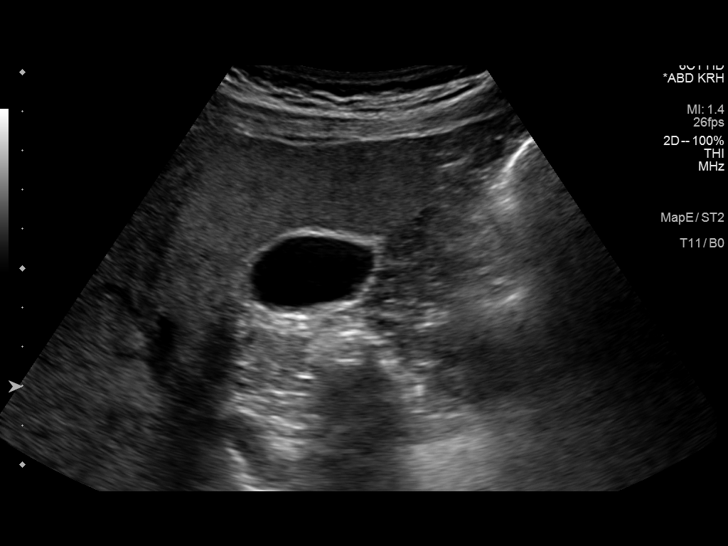
[im 47/75]
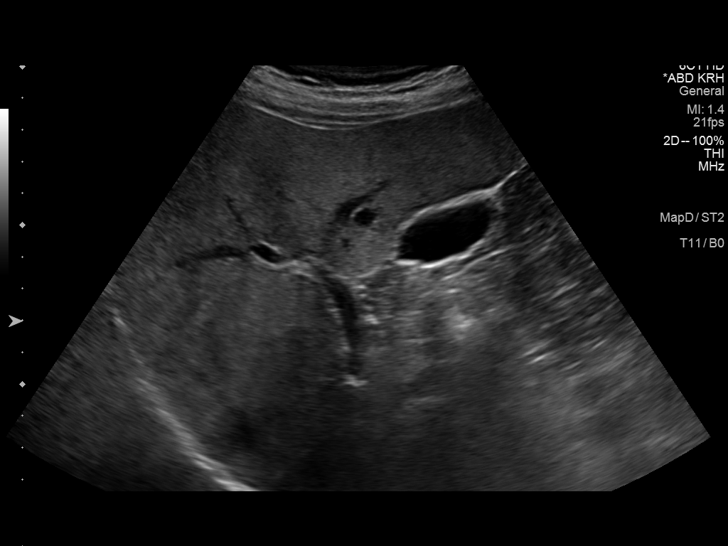
[im 50/75]
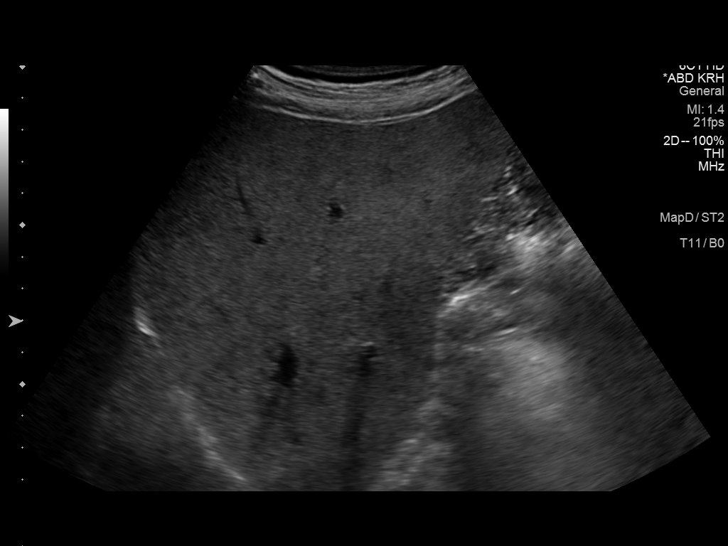
[im 56/75]
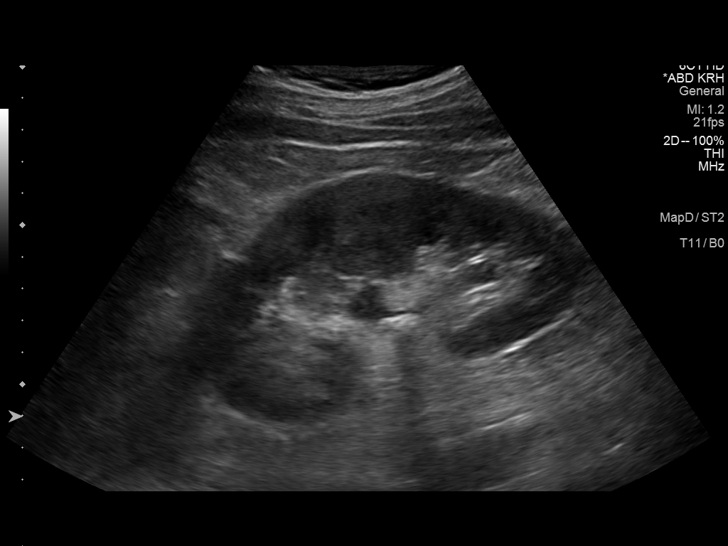
[im 62/75]
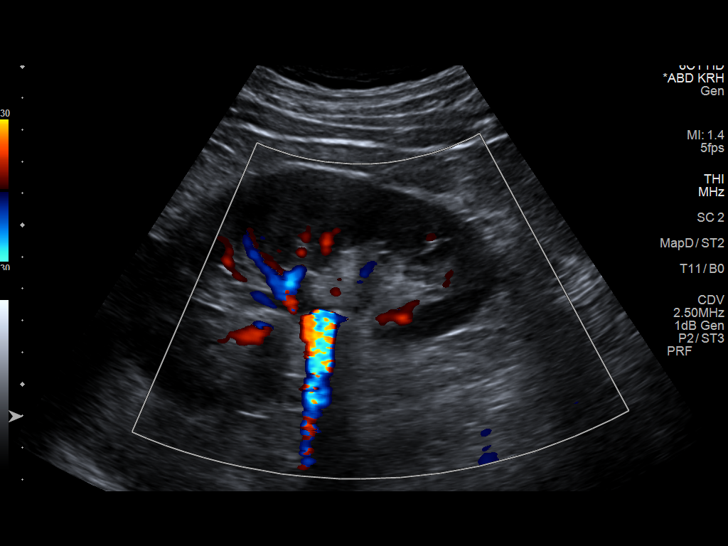
[im 68/75]
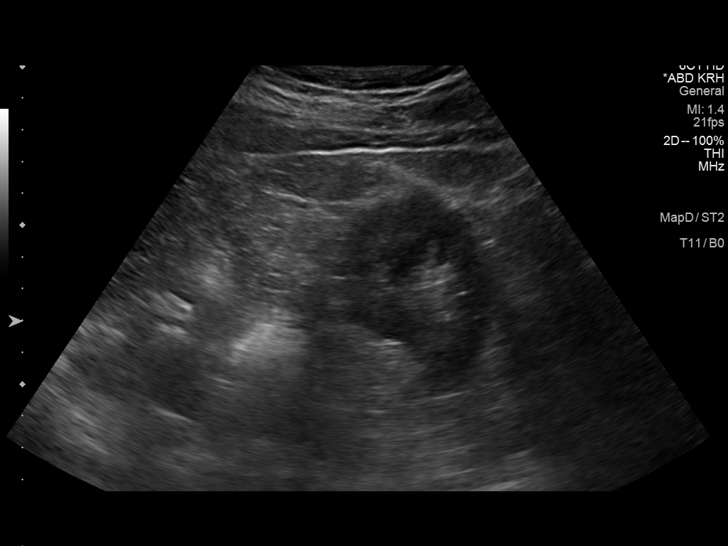
[im 75/75]
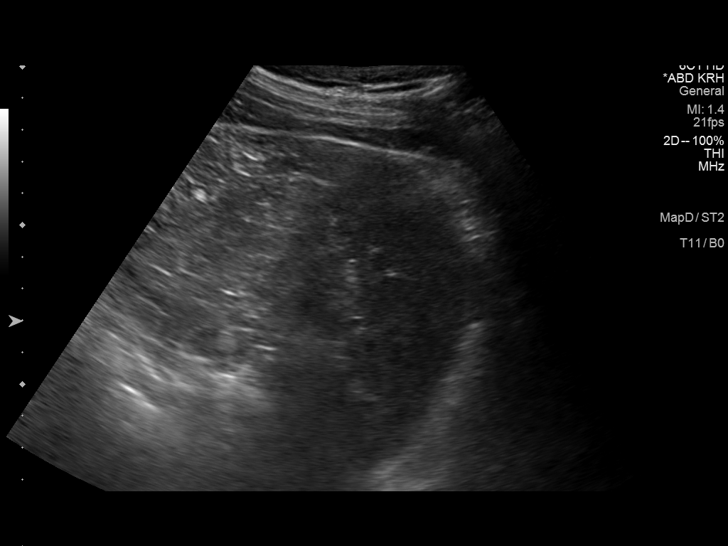

[14 of 25 positions shown; findings below may reference images not displayed]

FINDINGS: Gallbladder: No gallstones or wall thickening visualized. No
sonographic Murphy sign noted.

Common bile duct: Diameter: 4 mm.

Liver: Mild increased echogenicity consistent with fatty
infiltration.

IVC: No abnormality visualized.

Pancreas: Visualized portion unremarkable.

Spleen: Size and appearance within normal limits.

Right Kidney: Length: 11.4 cm.. Echogenicity within normal limits.
No mass or hydronephrosis visualized.

Left Kidney: Length: 12.3 cm.. 12 mm echogenic focus within the
renal pelvis with mild hydronephrosis this is consistent with a
partially obstructing renal pelvic stone.

Abdominal aorta: No aneurysm visualized.

Other findings: None.
IMPRESSION: Left renal calculus as described with mild obstructive changes.

No other focal abnormality is noted.

These results will be called to the ordering clinician or
representative by the Radiologist Assistant, and communication
documented in the PACS or zVision Dashboard.

## 2014-02-25 NOTE — Progress Notes (Signed)
Patient ID: Dakota [name], male   DOB: October 18, 1960, 54 y.o.   MRN: [id number]   Dakota [name]  [id number] 1960-12-22 02/25/2014      Progress Note-Follow Up  Subjective  Chief Complaint  Chief Complaint  Patient presents with  . Abdominal Pain    (L) side -chronic-getting worse    HPI  Patient is a 54 y.o. male in today for routine medical care. Patient notes ongoing left flank pain, worsening over past couple of weeks. Worse when he lies on the left side and better when he lies on the right. No other acute c/o. Denies n/v/anorexia/f/c/malaise/myalgias/weight loss/constipation/diarrhea/CP/palp/SOB/GI or GU c/o.  Past Medical History  Diagnosis Date  . CAD (coronary artery disease)   . Hypertension   . Hyperlipidemia   . Depression   . Chicken pox as a child  . Mumps as a child  . Asthma     childhood  . Allergy     cats  . Anxiety   . Depression 12/25/2012  . Left knee pain 12/25/2012  . 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 Insomnia 06/11/2013  . Preventative health care 09/24/2013    Past Surgical History  Procedure Laterality Date  . Coronary stent placement  01-14-05    Family History  Problem Relation Age of Onset  . Multiple sclerosis Mother   . Cancer Father     esophagus/ brain  . Alcohol abuse Father     smoker  . Seizures Sister   . COPD Sister   . Alcohol abuse Sister     addiction, recovered  . Heart disease Maternal Grandfather     MI    History   Social History  . Marital Status: Legally Separated    Spouse Name: N/A    Number of Children: N/A  . Years of Education: N/A   Occupational History  . Not on file.   Social History Main Topics  . Smoking status: Current Every Day Smoker -- 0.50 packs/day for 20 years    Types: Cigarettes  . Smokeless tobacco: Never Used  . Alcohol Use: Yes     Comment: 5 or 6 drinks a week   . Drug Use: No  . Sexual Activity:    Partners: Female     Comment: wife, real Investment banker, corporate, no  dairy, minimal fried foods   Other Topics Concern  . Not on file   Social History Narrative    Current Outpatient Prescriptions on File Prior to Visit  Medication Sig Dispense Refill  . amLODipine (NORVASC) 5 MG tablet Take 1 tablet (5 mg total) by mouth daily. 90 tablet 3  . aspirin 81 MG tablet Take 160 mg by mouth daily.    Marland Kitchen atorvastatin (LIPITOR) 80 MG tablet Take 1 tablet (80 mg total) by mouth daily. 90 tablet 3  . buPROPion (WELLBUTRIN XL) 300 MG 24 hr tablet TAKE 1 TABLET (300 MG TOTAL) BY MOUTH DAILY. 90 tablet 0  . Multiple Vitamin (MULTIVITAMIN) capsule Take 1 capsule by mouth daily.    Marland Kitchen zolpidem (AMBIEN) 10 MG tablet Take 1 tablet (10 mg total) by mouth at bedtime as needed for sleep. 30 tablet 2   No current facility-administered medications on file prior to visit.    Allergies  Allergen Reactions  . Neosporin [Neomycin-Bacitracin Zn-Polymyx] Hives and Itching    Review of Systems  Review of Systems  Constitutional: Negative for fever and malaise/fatigue.  HENT: Negative for congestion.   Eyes: Negative for discharge.  Respiratory: Negative for shortness of breath.   Cardiovascular: Negative for chest pain, palpitations and leg swelling.  Gastrointestinal: Positive for abdominal pain. Negative for nausea and diarrhea.       Left flank pain  Genitourinary: Negative for dysuria.  Musculoskeletal: Negative for falls.  Skin: Negative for rash.  Neurological: Negative for loss of consciousness and headaches.  Endo/Heme/Allergies: Negative for polydipsia.  Psychiatric/Behavioral: Negative for depression and suicidal ideas. The patient is not nervous/anxious and does not have insomnia.     Objective  BP 125/83 mmHg  Pulse 67  Temp(Src) 98 F (36.7 C) (Oral)  Ht 6' (1.829 m)  Wt 193 lb 6.4 oz (87.726 kg)  BMI 26.22 kg/m2  SpO2 97%  Physical Exam  Physical Exam  Constitutional: He is oriented to person, place, and time and well-developed, well-nourished,  and in no distress. No distress.  HENT:  Head: Normocephalic and atraumatic.  Eyes: Conjunctivae are normal.  Neck: Neck supple. No thyromegaly present.  Cardiovascular: Normal rate, regular rhythm and normal heart sounds.   No murmur heard. Pulmonary/Chest: Effort normal and breath sounds normal. No respiratory distress.  Abdominal: He exhibits no distension and no mass. There is no tenderness.  Musculoskeletal: He exhibits no edema.  Neurological: He is alert and oriented to person, place, and time.  Skin: Skin is warm.  Psychiatric: Memory, affect and judgment normal.    Lab Results  Component Value Date   TSH 1.73 02/21/2014   Lab Results  Component Value Date   WBC 7.3 02/21/2014   HGB 14.8 02/21/2014   HCT 43.5 02/21/2014   MCV 89.3 02/21/2014   PLT 242.0 02/21/2014   Lab Results  Component Value Date   CREATININE 0.81 02/21/2014   BUN 21 02/21/2014   NA 138 02/21/2014   K 3.9 02/21/2014   CL 105 02/21/2014   CO2 29 02/21/2014   Lab Results  Component Value Date   ALT 31 02/21/2014   AST 20 02/21/2014   ALKPHOS 88 02/21/2014   BILITOT 0.6 02/21/2014   Lab Results  Component Value Date   CHOL 173 09/14/2013   Lab Results  Component Value Date   HDL 37* 09/14/2013   Lab Results  Component Value Date   LDLCALC 112* 09/14/2013   Lab Results  Component Value Date   TRIG 121 09/14/2013   Lab Results  Component Value Date   CHOLHDL 4.7 09/14/2013     Assessment & Plan  Essential hypertension Well controlled, no changes to meds. Encouraged heart healthy diet such as the DASH diet and exercise as tolerated.    Left flank pain Worsens when he lies on left side and  Improves when he lies on right side. Ultrasound reveals left sided obstructing stone with hydronephrosis, he is referred to  Urology urgently for further treatment   Hyperlipidemia, mixed Tolerating statin, encouraged heart healthy diet, avoid trans fats, minimize simple carbs and  saturated fats. Increase exercise as tolerated   Tobacco abuse Encouraged complete cessation. Discussed need to quit as relates to risk of numerous cancers, cardiac and pulmonary disease as well as neurologic complications. Counseled for greater than 3 minutes

## 2014-02-25 NOTE — Assessment & Plan Note (Signed)
Worsens when he lies on left side and  Improves when he lies on right side. Ultrasound reveals left sided obstructing stone with hydronephrosis, he is referred to  Urology urgently for further treatment

## 2014-02-25 NOTE — Assessment & Plan Note (Signed)
Tolerating statin, encouraged heart healthy diet, avoid trans fats, minimize simple carbs and saturated fats. Increase exercise as tolerated 

## 2014-02-25 NOTE — Assessment & Plan Note (Signed)
Encouraged complete cessation. Discussed need to quit as relates to risk of numerous cancers, cardiac and pulmonary disease as well as neurologic complications. Counseled for greater than 3 minutes 

## 2014-02-25 NOTE — Assessment & Plan Note (Signed)
Well controlled, no changes to meds. Encouraged heart healthy diet such as the DASH diet and exercise as tolerated.  °

## 2014-03-02 ENCOUNTER — Other Ambulatory Visit: Payer: Self-pay | Admitting: Urology

## 2014-03-05 HISTORY — PX: LITHOTRIPSY: SUR834

## 2014-03-07 ENCOUNTER — Encounter (HOSPITAL_COMMUNITY): Payer: Self-pay | Admitting: *Deleted

## 2014-03-08 ENCOUNTER — Ambulatory Visit (HOSPITAL_COMMUNITY)
Admission: RE | Admit: 2014-03-08 | Discharge: 2014-03-08 | Disposition: A | Discharge status: Home / Self Care | Payer: Managed Care, Other (non HMO) | Source: Ambulatory Visit | Attending: Urology | Admitting: Urology

## 2014-03-08 ENCOUNTER — Ambulatory Visit (HOSPITAL_COMMUNITY): Payer: Managed Care, Other (non HMO)

## 2014-03-08 ENCOUNTER — Encounter (HOSPITAL_COMMUNITY): Payer: Self-pay | Admitting: *Deleted

## 2014-03-08 ENCOUNTER — Encounter (HOSPITAL_COMMUNITY)
Admission: RE | Disposition: A | Discharge status: Home / Self Care | Payer: Self-pay | Source: Ambulatory Visit | Attending: Urology

## 2014-03-08 DIAGNOSIS — I252 Old myocardial infarction: Secondary | ICD-10-CM | POA: Insufficient documentation

## 2014-03-08 DIAGNOSIS — F329 Major depressive disorder, single episode, unspecified: Secondary | ICD-10-CM | POA: Insufficient documentation

## 2014-03-08 DIAGNOSIS — E780 Pure hypercholesterolemia: Secondary | ICD-10-CM | POA: Diagnosis not present

## 2014-03-08 DIAGNOSIS — I10 Essential (primary) hypertension: Secondary | ICD-10-CM | POA: Insufficient documentation

## 2014-03-08 DIAGNOSIS — J45909 Unspecified asthma, uncomplicated: Secondary | ICD-10-CM | POA: Diagnosis not present

## 2014-03-08 DIAGNOSIS — N200 Calculus of kidney: Secondary | ICD-10-CM | POA: Diagnosis not present

## 2014-03-08 DIAGNOSIS — F17210 Nicotine dependence, cigarettes, uncomplicated: Secondary | ICD-10-CM | POA: Insufficient documentation

## 2014-03-08 IMAGING — CR DG ABDOMEN 1V
2 series · 2 of 2 positions shown · non-contrast
Comparison: CT abdomen and pelvis [DATE].

CLINICAL DATA: Preop for left-sided kidney stone.

EXAM:
ABDOMEN - 1 VIEW

[t abdomen supine (1 of 2)]
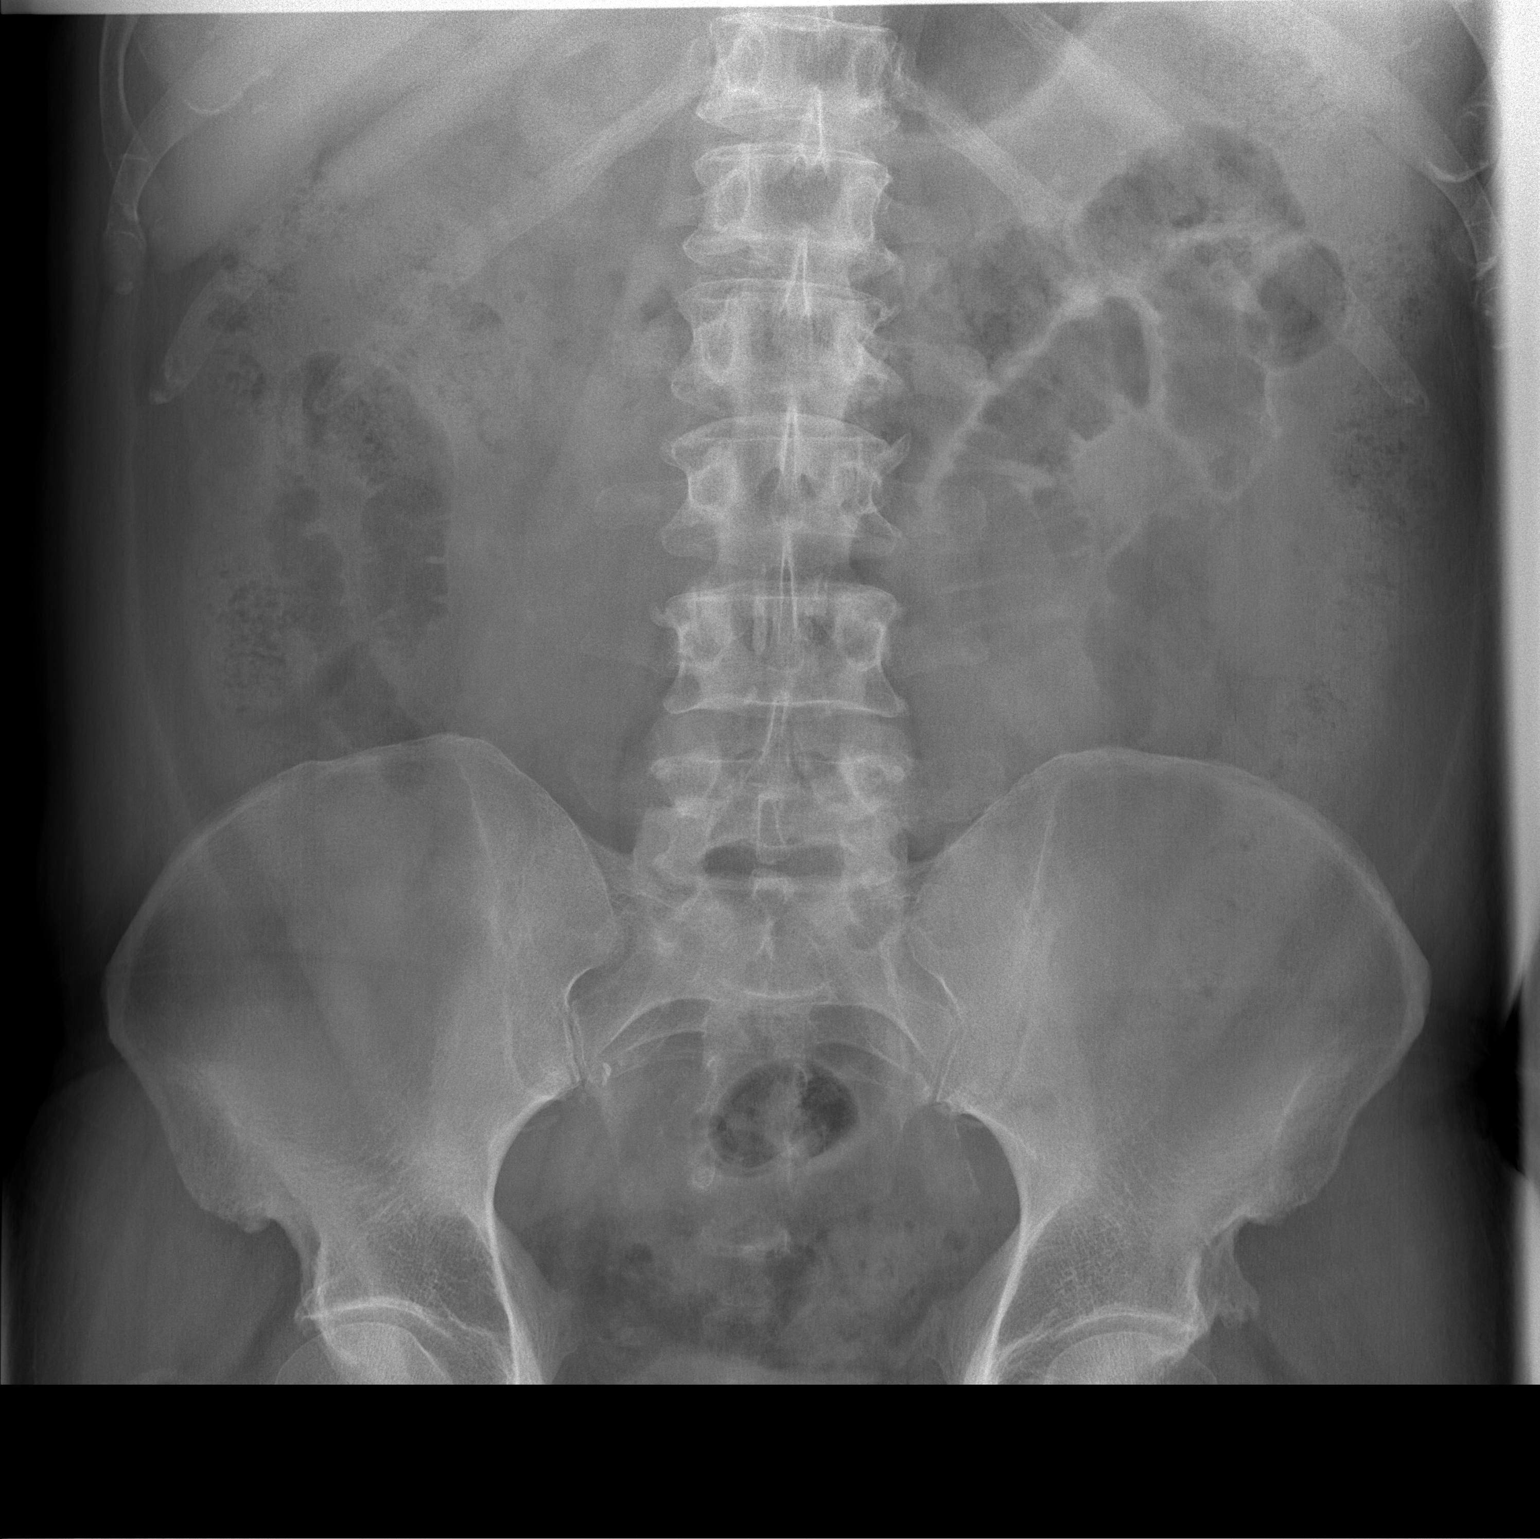

[t abdomen supine (2 of 2)]
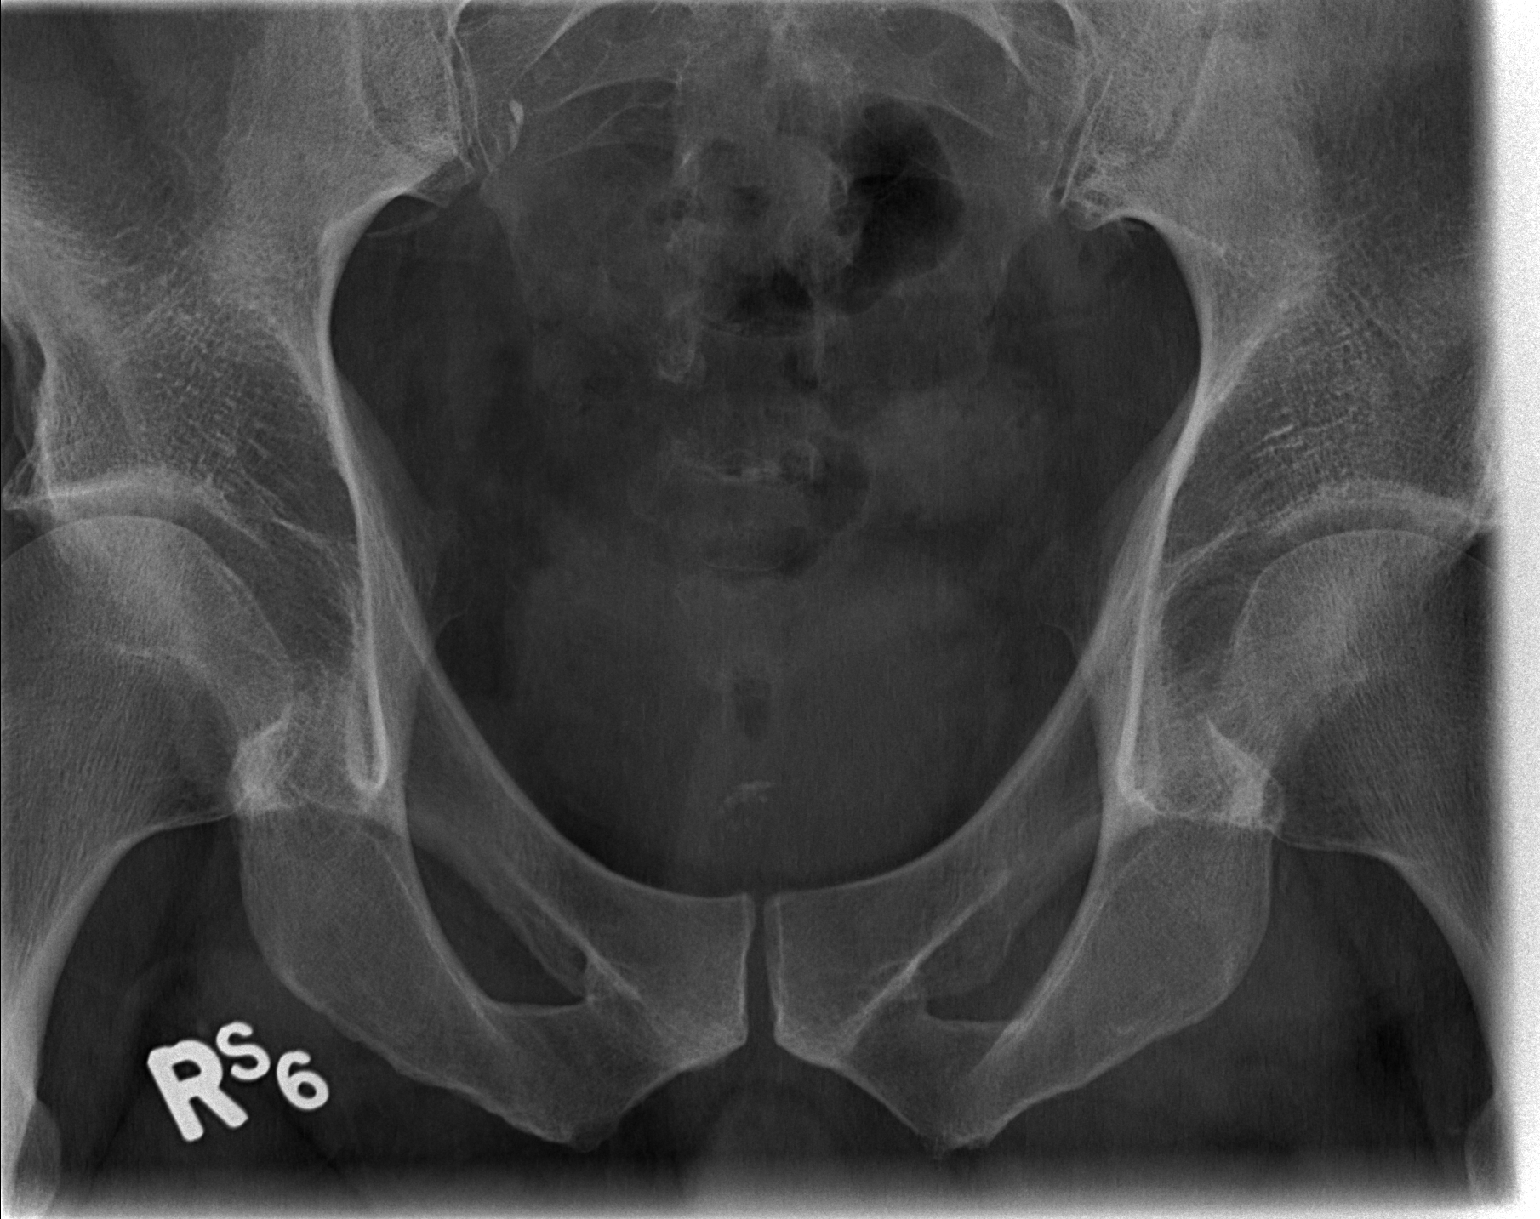

[2 of 2 positions shown; findings below may reference images not displayed]

FINDINGS: The left UPJ stone may be obscured by overlying loops of small
bowel. The bowel gas pattern is normal. No definite stones are
evident on either side. Mild degenerative changes are again seen in
the lumbar spine.
IMPRESSION: 1. Normal bowel gas pattern obscures potential left-sided renal
stones.

## 2014-03-08 SURGERY — LITHOTRIPSY, ESWL
Anesthesia: LOCAL | Laterality: Left

## 2014-03-08 MED ORDER — DEXTROSE-NACL 5-0.45 % IV SOLN
INTRAVENOUS | Status: DC
  Administered 2014-03-08: 12:00:00 via INTRAVENOUS

## 2014-03-08 MED ORDER — DIAZEPAM 5 MG PO TABS
10.0000 mg | ORAL_TABLET | ORAL | Status: AC
  Administered 2014-03-08: 10 mg via ORAL
  Filled 2014-03-08: qty 2

## 2014-03-08 MED ORDER — CIPROFLOXACIN HCL 500 MG PO TABS
500.0000 mg | ORAL_TABLET | ORAL | Status: AC
  Administered 2014-03-08: 500 mg via ORAL
  Filled 2014-03-08: qty 1

## 2014-03-08 MED ORDER — DIPHENHYDRAMINE HCL 25 MG PO CAPS
25.0000 mg | ORAL_CAPSULE | ORAL | Status: AC
  Administered 2014-03-08: 25 mg via ORAL
  Filled 2014-03-08: qty 1

## 2014-03-08 NOTE — Op Note (Signed)
Refer to [facility] Op Note scanned in the chart 

## 2014-03-08 NOTE — H&P (Signed)
History of Present Illness Dakota [name] is referred by Dr Penni Homans. He has been having mild to moderate left flank pain on and off for about a year. The pain does not radiate and has not been associated with nausea or vomiting. He denies any voiding symptoms. He does not have a history of kidney stone. Renal ultrasound shows mild right hydronephrosis and a 12 mm calcification in the left renal pelvis. BUN is 14. Creatinine is 0.63.   Past Medical History Problems  1. History of acute myocardial infarction (I25.2) 2. History of arthritis (Z87.39) 3. History of asthma (Z87.09) 4. History of cardiac disorder (Z86.79) 5. History of depression (Z86.59) 6. History of gout (Z87.39) 7. History of hypercholesterolemia (Z86.39) 8. History of hypertension (Z86.79)  Surgical History Problems  1. History of Previous Stent Placement  Current Meds 1. Ambien TABS;  Therapy: (Recorded:28Jan2016) to Recorded 2. AmLODIPine Besylate TABS;  Therapy: (Recorded:28Jan2016) to Recorded 3. Aspirin 81 MG Oral Tablet;  Therapy: (Recorded:28Jan2016) to Recorded 4. Lipitor 80 MG Oral Tablet;  Therapy: (Recorded:28Jan2016) to Recorded 5. Wellbutrin TABS;  Therapy: (Recorded:28Jan2016) to Recorded  Allergies Medication  1. Neosporin OINT  Family History Problems  1. Family history of AVM (arteriovenous malformation) : Sister 2. Family history of Deceased : Mother, Father 3. Family history of diabetes mellitus (Z83.3) 4. Family history of malignant neoplasm (Z80.9) : Father 5. Family history of multiple sclerosis (Z82.0) : Mother  Social History Problems    Alcohol use (F10.99)   Caffeine use (F15.90)   Current every day smoker (F17.200)   Married   Number of children   Occupation   Smokes cigarettes (F17.210)  Review of Systems Genitourinary, constitutional, skin, eye, otolaryngeal, hematologic/lymphatic, cardiovascular, pulmonary, endocrine, musculoskeletal, gastrointestinal,  neurological and psychiatric system(s) were reviewed and pertinent findings if present are noted and are otherwise negative.  Genitourinary: nocturia.  Constitutional: night sweats and recent weight loss.    Vitals Vital Signs [Data Includes: Last 1 Day]  Recorded: 28Jan2016 01:07PM  Height: 6 ft  Weight: 193 lb  BMI Calculated: 26.18 BSA Calculated: 2.1 Blood Pressure: 120 / 77 Heart Rate: 78  Physical Exam Constitutional: Well nourished and well developed . No acute distress.  ENT:. The ears and nose are normal in appearance.  Neck: The appearance of the neck is normal and no neck mass is present.  Pulmonary: No respiratory distress and normal respiratory rhythm and effort.  Cardiovascular: Heart rate and rhythm are normal . No peripheral edema.  Abdomen: The abdomen is soft and nontender. No masses are palpated. No CVA tenderness. No hernias are palpable. No hepatosplenomegaly noted.  Rectal: Rectal exam demonstrates normal sphincter tone, no tenderness and no masses. Estimated prostate size is 2+. The prostate has no nodularity and is not tender. The left seminal vesicle is nonpalpable. The right seminal vesicle is nonpalpable. The perineum is normal on inspection.  Genitourinary: Examination of the penis demonstrates no discharge, no masses, no lesions and a normal meatus. The penis is circumcised. The scrotum is without lesions. The right epididymis is palpably normal and non-tender. The left epididymis is palpably normal and non-tender. The right testis is non-tender and without masses. The left testis is non-tender and without masses.  Lymphatics: The femoral and inguinal nodes are not enlarged or tender.  Skin: Normal skin turgor, no visible rash and no visible skin lesions.  Neuro/Psych:. Mood and affect are appropriate.    Results/Data Urine [Data Includes: Last 1 Day]   [id number]  COLOR YELLOW  APPEARANCE CLEAR   SPECIFIC GRAVITY 1.020   pH 6.5   GLUCOSE NEG mg/dL   BILIRUBIN NEG   KETONE NEG mg/dL  BLOOD MOD   PROTEIN NEG mg/dL  UROBILINOGEN 0.2 mg/dL  NITRITE NEG   LEUKOCYTE ESTERASE NEG   SQUAMOUS EPITHELIAL/HPF RARE   WBC 0-2 WBC/hpf  RBC 11-20 RBC/hpf  BACTERIA RARE   CRYSTALS NONE SEEN   CASTS NONE SEEN    CT scan shows an 11 x 6 mm stone in the left renal pelvis and a 2 mm stone in the upper pole of the left kidney. Official report will follow.   Assessment Assessed  1. Calculus of left kidney (N20.0)  Plan Calculus of left kidney  1. AU CT-STONE PROTOCOL; Status:In Progress - Specimen/Data Collected;   Done:  [id number] 12:00AM Health Maintenance  2. UA With REFLEX; [Do Not Release]; Status:Complete;   Done: [id number] 12:51PM  I discussed treatment options with him: ESL, PCNL, ureteroscopy with holmium laser. The risks, benefits of each option were reviewed with the patient. I told him that ESL is the least invasive of those procedures. The smaller stone is non obstructing. The risks of ESL include but are not limited to hemorrhage, renal or perirenal hematoma, steinstrasse, inability to fragment the stone. He understands and wishes to proceed.

## 2014-03-08 NOTE — Discharge Instructions (Signed)

## 2014-03-11 ENCOUNTER — Encounter (HOSPITAL_COMMUNITY): Payer: Self-pay | Admitting: Emergency Medicine

## 2014-03-11 ENCOUNTER — Emergency Department (HOSPITAL_COMMUNITY)
Admission: EM | Admit: 2014-03-11 | Discharge: 2014-03-11 | Disposition: A | Discharge status: Home / Self Care | Payer: Managed Care, Other (non HMO) | Attending: Emergency Medicine | Admitting: Emergency Medicine

## 2014-03-11 ENCOUNTER — Emergency Department (HOSPITAL_COMMUNITY): Payer: Managed Care, Other (non HMO)

## 2014-03-11 DIAGNOSIS — E785 Hyperlipidemia, unspecified: Secondary | ICD-10-CM | POA: Insufficient documentation

## 2014-03-11 DIAGNOSIS — N200 Calculus of kidney: Secondary | ICD-10-CM | POA: Diagnosis not present

## 2014-03-11 DIAGNOSIS — J45909 Unspecified asthma, uncomplicated: Secondary | ICD-10-CM | POA: Insufficient documentation

## 2014-03-11 DIAGNOSIS — Z79899 Other long term (current) drug therapy: Secondary | ICD-10-CM | POA: Diagnosis not present

## 2014-03-11 DIAGNOSIS — Z8669 Personal history of other diseases of the nervous system and sense organs: Secondary | ICD-10-CM | POA: Insufficient documentation

## 2014-03-11 DIAGNOSIS — I2510 Atherosclerotic heart disease of native coronary artery without angina pectoris: Secondary | ICD-10-CM | POA: Insufficient documentation

## 2014-03-11 DIAGNOSIS — I10 Essential (primary) hypertension: Secondary | ICD-10-CM | POA: Insufficient documentation

## 2014-03-11 DIAGNOSIS — Z7982 Long term (current) use of aspirin: Secondary | ICD-10-CM | POA: Insufficient documentation

## 2014-03-11 DIAGNOSIS — N23 Unspecified renal colic: Secondary | ICD-10-CM

## 2014-03-11 DIAGNOSIS — Z8619 Personal history of other infectious and parasitic diseases: Secondary | ICD-10-CM | POA: Diagnosis not present

## 2014-03-11 DIAGNOSIS — R1030 Lower abdominal pain, unspecified: Secondary | ICD-10-CM | POA: Diagnosis present

## 2014-03-11 DIAGNOSIS — Z720 Tobacco use: Secondary | ICD-10-CM | POA: Diagnosis not present

## 2014-03-11 DIAGNOSIS — Z8739 Personal history of other diseases of the musculoskeletal system and connective tissue: Secondary | ICD-10-CM | POA: Diagnosis not present

## 2014-03-11 DIAGNOSIS — F419 Anxiety disorder, unspecified: Secondary | ICD-10-CM | POA: Insufficient documentation

## 2014-03-11 LAB — URINE MICROSCOPIC-ADD ON

## 2014-03-11 LAB — I-STAT CHEM 8, ED
BUN: 16 mg/dL (ref 6–23)
CALCIUM ION: 1.13 mmol/L (ref 1.12–1.23)
CHLORIDE: 102 mmol/L (ref 96–112)
Creatinine, Ser: 1.1 mg/dL (ref 0.50–1.35)
GLUCOSE: 107 mg/dL — AB (ref 70–99)
HEMATOCRIT: 45 % (ref 39.0–52.0)
HEMOGLOBIN: 15.3 g/dL (ref 13.0–17.0)
Potassium: 4.6 mmol/L (ref 3.5–5.1)
Sodium: 138 mmol/L (ref 135–145)
TCO2: 22 mmol/L (ref 0–100)

## 2014-03-11 LAB — URINALYSIS, ROUTINE W REFLEX MICROSCOPIC
Bilirubin Urine: NEGATIVE
GLUCOSE, UA: NEGATIVE mg/dL
Ketones, ur: 15 mg/dL — AB
Nitrite: NEGATIVE
Protein, ur: NEGATIVE mg/dL
Specific Gravity, Urine: 1.021 (ref 1.005–1.030)
Urobilinogen, UA: 1 mg/dL (ref 0.0–1.0)
pH: 7 (ref 5.0–8.0)

## 2014-03-11 LAB — CBC WITH DIFFERENTIAL/PLATELET
Basophils Absolute: 0 10*3/uL (ref 0.0–0.1)
Basophils Relative: 0 % (ref 0–1)
Eosinophils Absolute: 0.1 10*3/uL (ref 0.0–0.7)
Eosinophils Relative: 1 % (ref 0–5)
HCT: 41.2 % (ref 39.0–52.0)
HEMOGLOBIN: 13.6 g/dL (ref 13.0–17.0)
LYMPHS PCT: 9 % — AB (ref 12–46)
Lymphs Abs: 1.2 10*3/uL (ref 0.7–4.0)
MCH: 30.4 pg (ref 26.0–34.0)
MCHC: 33 g/dL (ref 30.0–36.0)
MCV: 92.2 fL (ref 78.0–100.0)
MONOS PCT: 9 % (ref 3–12)
Monocytes Absolute: 1.3 10*3/uL — ABNORMAL HIGH (ref 0.1–1.0)
Neutro Abs: 11.2 10*3/uL — ABNORMAL HIGH (ref 1.7–7.7)
Neutrophils Relative %: 81 % — ABNORMAL HIGH (ref 43–77)
Platelets: 235 10*3/uL (ref 150–400)
RBC: 4.47 MIL/uL (ref 4.22–5.81)
RDW: 12.6 % (ref 11.5–15.5)
WBC: 13.9 10*3/uL — ABNORMAL HIGH (ref 4.0–10.5)

## 2014-03-11 LAB — COMPREHENSIVE METABOLIC PANEL
ALT: 37 U/L (ref 0–53)
AST: 29 U/L (ref 0–37)
Albumin: 4.7 g/dL (ref 3.5–5.2)
Alkaline Phosphatase: 100 U/L (ref 39–117)
Anion gap: 8 (ref 5–15)
BILIRUBIN TOTAL: 1 mg/dL (ref 0.3–1.2)
BUN: 16 mg/dL (ref 6–23)
CALCIUM: 9.2 mg/dL (ref 8.4–10.5)
CHLORIDE: 103 mmol/L (ref 96–112)
CO2: 26 mmol/L (ref 19–32)
CREATININE: 1.03 mg/dL (ref 0.50–1.35)
GFR calc Af Amer: >90 mL/min (ref >90)
GFR calc non Af Amer: 81 mL/min — ABNORMAL LOW (ref >90)
GLUCOSE: 111 mg/dL — AB (ref 70–99)
POTASSIUM: 4 mmol/L (ref 3.5–5.1)
SODIUM: 137 mmol/L (ref 135–145)
Total Protein: 7.8 g/dL (ref 6.0–8.3)

## 2014-03-11 IMAGING — CR DG ABDOMEN 1V
1 series · 1 of 1 positions shown · non-contrast
Comparison: [DATE] and CT [DATE]

CLINICAL DATA: Pt. states LLQ sharp pain x 1 day, pt. states he has
had pain all year, but this morning it started to get more severe.
Pt. states he had left side lithotripsy x 3 days ago. Bowel
movements are not often lately. Nausea/vomiting. H/o HTN.

EXAM:
ABDOMEN - 1 VIEW

[t abdomen supine]
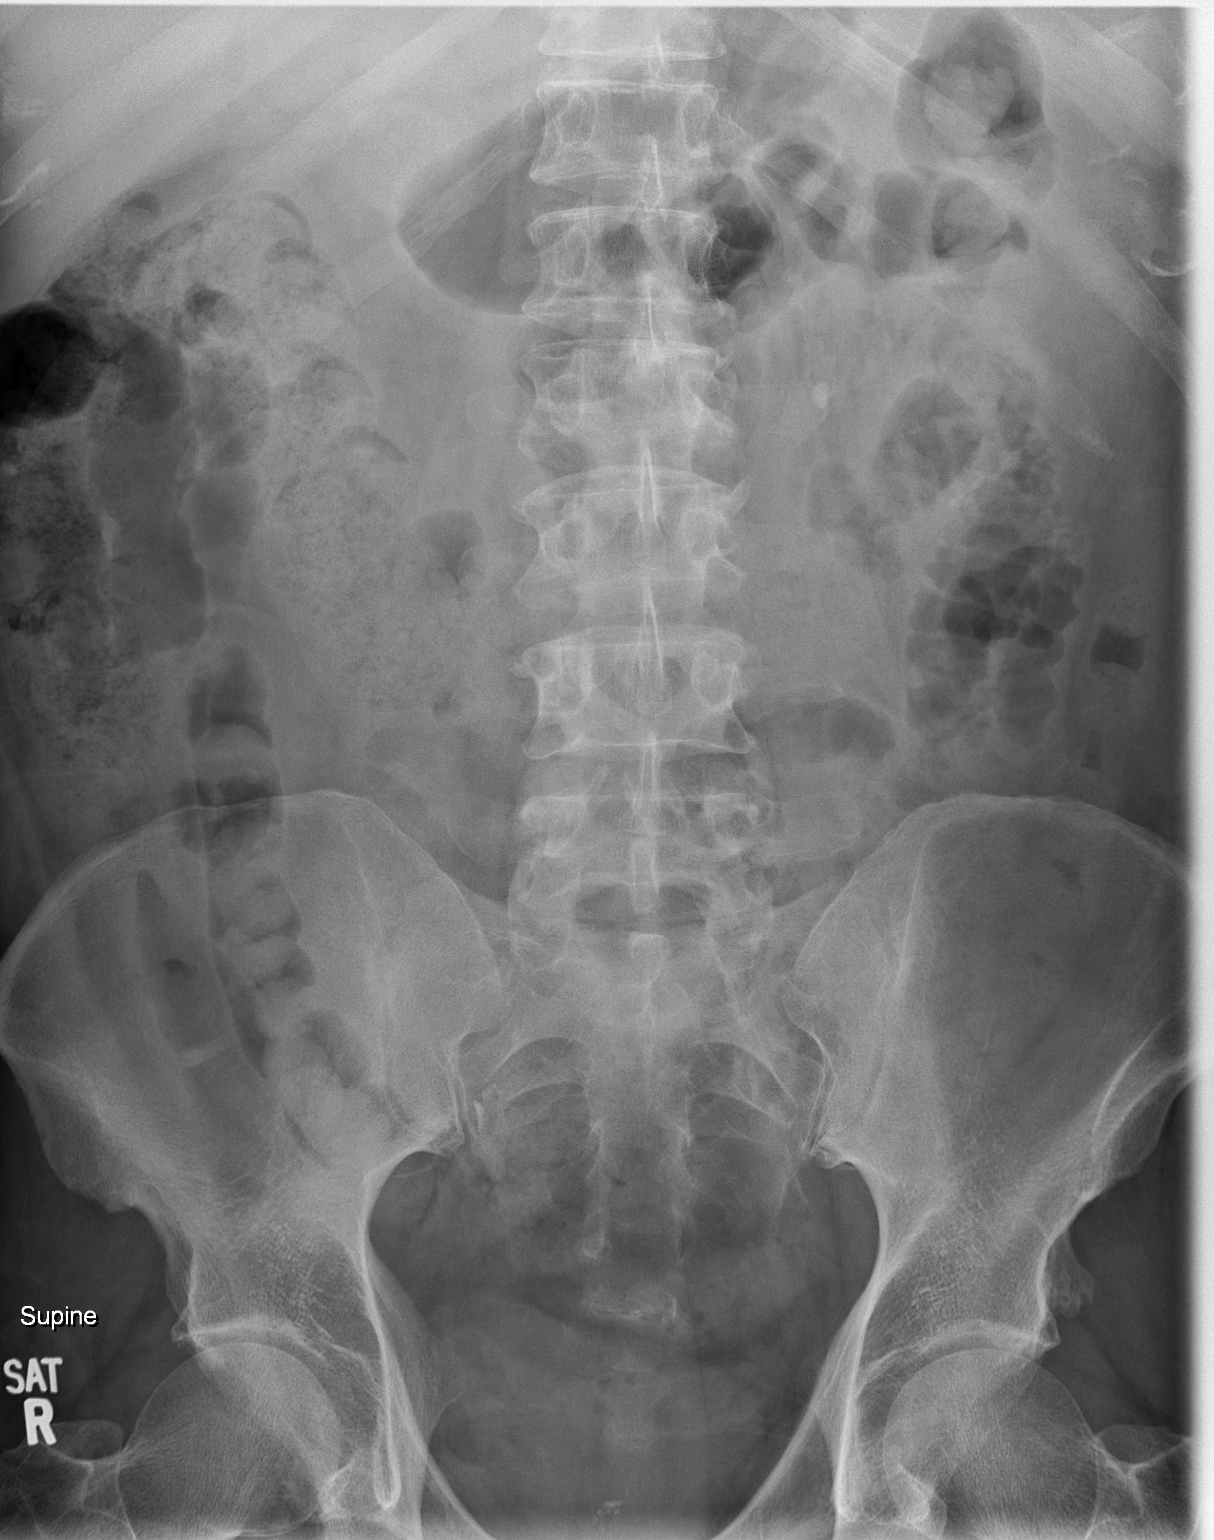

[1 of 1 positions shown; findings below may reference images not displayed]

FINDINGS: Exam demonstrates air and stool throughout the colon. There are a
few air-filled minimally dilated small bowel loops. No definite free
peritoneal air. 6 mm calcification projecting over the expected
region of the left UPJ likely representing patient's known stone.
Mild gyn change of the spine and hips.
IMPRESSION: 6 mm calcification projected over the expected region of the left
UPJ and likely representing patient's known stone in this location.

Few mildly dilated small bowel loops with air throughout the colon.
Findings likely due to ileus.

## 2014-03-11 MED ORDER — ONDANSETRON HCL 4 MG/2ML IJ SOLN
4.0000 mg | Freq: Once | INTRAMUSCULAR | Status: AC
  Administered 2014-03-11: 4 mg via INTRAVENOUS
  Filled 2014-03-11: qty 2

## 2014-03-11 MED ORDER — OXYCODONE-ACETAMINOPHEN 5-325 MG PO TABS: 2.0000 | ORAL_TABLET | ORAL | Status: DC | PRN

## 2014-03-11 MED ORDER — SILVER SULFADIAZINE 1 % EX CREA: TOPICAL_CREAM | Freq: Two times a day (BID) | CUTANEOUS | Status: DC

## 2014-03-11 MED ORDER — HYDROMORPHONE HCL 1 MG/ML IJ SOLN
1.0000 mg | Freq: Once | INTRAMUSCULAR | Status: AC
  Administered 2014-03-11: 1 mg via INTRAVENOUS
  Filled 2014-03-11: qty 1

## 2014-03-11 MED ORDER — KETOROLAC TROMETHAMINE 30 MG/ML IJ SOLN
30.0000 mg | Freq: Once | INTRAMUSCULAR | Status: AC
  Administered 2014-03-11: 30 mg via INTRAVENOUS
  Filled 2014-03-11: qty 1

## 2014-03-11 NOTE — ED Notes (Signed)
Pt aware of the need for a urine sample. Urinal at bedside. 

## 2014-03-11 NOTE — ED Notes (Signed)
Patient here with left flank pain non radiating. Nausea/vomiting. Lithotripsy done 2/4. Pain worse, no relief with hydrocodone.

## 2014-03-11 NOTE — Discharge Instructions (Signed)

## 2014-03-11 NOTE — ED Notes (Signed)
Pt alert, oriented, and ambulatory upon DC. He was advised to follow up with urology [facility])

## 2014-03-11 NOTE — ED Provider Notes (Signed)
CSN: [id number]     Arrival date & time 03/11/14  46 History   First MD Initiated Contact with Patient 03/11/14 1513     Chief Complaint  Patient presents with  . Flank Pain     (Consider location/radiation/quality/duration/timing/severity/associated sxs/prior Treatment) HPI Comments: Pt with a hx of about one year of left flank pain, recently diagnosed with 62mm stone in left renal pelvis, had lithotripsy done on Feb 4 by Dr. Janice Norrie.  Was doing ok for last 2 days, today started having intense pain to left flank, is in same area as he had previously, but more intense.  He states the pain is in his left back and radiates to his left midabdomen. He's had some nausea but no vomiting. He denies any fevers or chills. He's had some decreased urination and urinary urgency. He denies any gross blood in his urine.  Patient is a 54 y.o. male presenting with flank pain.  Flank Pain Associated symptoms include abdominal pain. Pertinent negatives include no chest pain, no headaches and no shortness of breath.    Past Medical History  Diagnosis Date  . CAD (coronary artery disease)   . Hypertension   . Hyperlipidemia   . Depression   . Chicken pox as a child  . Mumps as a child  . Asthma     childhood  . Allergy     cats  . Anxiety   . Depression 12/25/2012  . Left knee pain 12/25/2012  . 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 Insomnia 06/11/2013  . Preventative health care 09/24/2013   Past Surgical History  Procedure Laterality Date  . Coronary stent placement  01-14-05   Family History  Problem Relation Age of Onset  . Multiple sclerosis Mother   . Cancer Father     esophagus/ brain  . Alcohol abuse Father     smoker  . Seizures Sister   . COPD Sister   . Alcohol abuse Sister     addiction, recovered  . Heart disease Maternal Grandfather     MI   History  Substance Use Topics  . Smoking status: Current Every Day Smoker -- 0.50 packs/day for 20 years    Types:  Cigarettes  . Smokeless tobacco: Never Used  . Alcohol Use: Yes     Comment: 5 or 6 drinks a week     Review of Systems  Constitutional: Negative for fever, chills, diaphoresis and fatigue.  HENT: Negative for congestion, rhinorrhea and sneezing.   Eyes: Negative.   Respiratory: Negative for cough, chest tightness and shortness of breath.   Cardiovascular: Negative for chest pain and leg swelling.  Gastrointestinal: Positive for nausea and abdominal pain. Negative for vomiting, diarrhea and blood in stool.  Genitourinary: Positive for urgency and flank pain. Negative for frequency, hematuria and difficulty urinating.  Musculoskeletal: Negative for back pain and arthralgias.  Skin: Negative for rash.  Neurological: Negative for dizziness, speech difficulty, weakness, numbness and headaches.      Allergies  Neosporin and Polysporin  Home Medications   Prior to Admission medications   Medication Sig Start Date End Date Taking? Authorizing Provider  amLODipine (NORVASC) 5 MG tablet Take 1 tablet (5 mg total) by mouth daily. 02/12/14  Yes Lelon Perla, MD  aspirin 81 MG tablet Take 81 mg by mouth daily.    Yes Historical Provider, MD  atorvastatin (LIPITOR) 80 MG tablet Take 1 tablet (80 mg total) by mouth daily. 02/12/14  Yes Lelon Perla,  MD  buPROPion (WELLBUTRIN XL) 300 MG 24 hr tablet TAKE 1 TABLET (300 MG TOTAL) BY MOUTH DAILY. 01/31/14  Yes [name], MD  KRILL OIL PO Take 1 capsule by mouth daily.   Yes Historical Provider, MD  zolpidem (AMBIEN) 10 MG tablet Take 1 tablet (10 mg total) by mouth at bedtime as needed for sleep. 01/02/14  Yes [name], MD  oxyCODONE-acetaminophen (PERCOCET) 5-325 MG per tablet Take 2 tablets by mouth every 4 (four) hours as needed. 03/11/14   Malvin Johns, MD   BP 140/85 mmHg  Pulse 86  Temp(Src) 98.6 F (37 C) (Oral)  Resp 20  SpO2 100% Physical Exam  Constitutional: He is oriented to person, place, and time. He appears  well-developed and well-nourished.  HENT:  Head: Normocephalic and atraumatic.  Eyes: Pupils are equal, round, and reactive to light.  Neck: Normal range of motion. Neck supple.  Cardiovascular: Normal rate, regular rhythm and normal heart sounds.   Pulmonary/Chest: Effort normal and breath sounds normal. No respiratory distress. He has no wheezes. He has no rales. He exhibits no tenderness.  Abdominal: Soft. Bowel sounds are normal. There is tenderness (moderate tenderness to left mid abdomen). There is no rebound and no guarding.  Musculoskeletal: Normal range of motion. He exhibits no edema.  Lymphadenopathy:    He has no cervical adenopathy.  Neurological: He is alert and oriented to person, place, and time.  Skin: Skin is warm and dry. No rash noted.  Psychiatric: He has a normal mood and affect.    ED Course  Procedures (including critical care time) Labs Review Labs Reviewed  CBC WITH DIFFERENTIAL/PLATELET - Abnormal; Notable for the following:    WBC 13.9 (*)    Neutrophils Relative % 81 (*)    Neutro Abs 11.2 (*)    Lymphocytes Relative 9 (*)    Monocytes Absolute 1.3 (*)    All other components within normal limits  COMPREHENSIVE METABOLIC PANEL - Abnormal; Notable for the following:    Glucose, Bld 111 (*)    GFR calc non Af Amer 81 (*)    All other components within normal limits  URINALYSIS, ROUTINE W REFLEX MICROSCOPIC - Abnormal; Notable for the following:    Hgb urine dipstick MODERATE (*)    Ketones, ur 15 (*)    Leukocytes, UA TRACE (*)    All other components within normal limits  URINE MICROSCOPIC-ADD ON - Abnormal; Notable for the following:    Bacteria, UA FEW (*)    All other components within normal limits  I-STAT CHEM 8, ED - Abnormal; Notable for the following:    Glucose, Bld 107 (*)    All other components within normal limits    Imaging Review Dg Abd 1 View  03/11/2014   CLINICAL DATA:  Pt. states LLQ sharp pain x 1 day, pt. states he has had  pain all year, but this morning it started to get more severe. Pt. states he had left side lithotripsy x 3 days ago. Bowel movements are not often lately. Nausea/vomiting. H/o HTN.  EXAM: ABDOMEN - 1 VIEW  COMPARISON:  03/08/2014 and CT 03/01/2014  FINDINGS: Exam demonstrates air and stool throughout the colon. There are a few air-filled minimally dilated small bowel loops. No definite free peritoneal air. 6 mm calcification projecting over the expected region of the left UPJ likely representing patient's known stone. Mild gyn change of the spine and hips.  IMPRESSION: 6 mm calcification projected over the expected  region of the left UPJ and likely representing patient's known stone in this location.  Few mildly dilated small bowel loops with air throughout the colon. Findings likely due to ileus.   Electronically Signed   By: Marin Olp M.D.   On: 03/11/2014 16:17     EKG Interpretation None      MDM   Final diagnoses:  Renal colic    Patient presents with left flank pain. He has a known calculus in the left renal pelvis. He had recent lithotripsy. He was given 1 dose of Dilaudid and Toradol he is completely pain-free after this. A KUB shows a 6 mm calculus in the level of the UPJ. His creatinine is normal. He is afebrile. His urine does not appear to be infected. He was discharged home. He's been using hydrocodone at home and this is not controlling his pain today. I gave him a prescription for oxycodone to try. He is instructed to follow with Dr. Janice Norrie if his pain continues. Return precautions were given.    Malvin Johns, MD 03/11/14 [phone]

## 2014-03-30 ENCOUNTER — Telehealth: Payer: Self-pay | Admitting: Family Medicine

## 2014-03-30 MED ORDER — ZOLPIDEM TARTRATE 10 MG PO TABS: 10.0000 mg | ORAL_TABLET | Freq: Every evening | ORAL | Status: DC | PRN

## 2014-03-30 NOTE — Telephone Encounter (Signed)
Requesting 90 day supply.

## 2014-03-30 NOTE — Telephone Encounter (Signed)
Printed prescription as PCP instructed and on counter for signature.

## 2014-03-30 NOTE — Telephone Encounter (Signed)
Caller name: Dawood Relation to pt: self Call back number:  [phone] [facility]: [facility] [address]  Reason for call:   Requesting ambien refill

## 2014-03-30 NOTE — Telephone Encounter (Signed)
Faxed hardcopy for Zolpidem #30 with 2 refills to [facility]

## 2014-03-30 NOTE — Telephone Encounter (Signed)
OK to refill on the Ambien with #30 with 2 rf

## 2014-06-26 ENCOUNTER — Other Ambulatory Visit: Payer: Self-pay | Admitting: Family Medicine

## 2014-06-26 MED ORDER — BUPROPION HCL ER (XL) 300 MG PO TB24: ORAL_TABLET | ORAL | Status: DC

## 2014-06-28 ENCOUNTER — Telehealth: Payer: Self-pay | Admitting: Family Medicine

## 2014-06-28 MED ORDER — ZOLPIDEM TARTRATE 10 MG PO TABS: 10.0000 mg | ORAL_TABLET | Freq: Every evening | ORAL | Status: DC | PRN

## 2014-06-28 NOTE — Telephone Encounter (Signed)
Faxed hardcopy for Zolpidem to [facility].

## 2014-06-28 NOTE — Telephone Encounter (Signed)
Last office visit 03/30/14 Next scheduled office visit 09/20/14 Last refill 03/30/14  #30 with 2 refills.  Advise on this refill

## 2014-07-25 NOTE — Telephone Encounter (Signed)
Pt called about this med after trying to fill locally. Pt was told it was denied bc sent to [facility]. Pt states it was not. Read note to pt and advised to contact [facility].

## 2014-08-03 MED ORDER — ZOLPIDEM TARTRATE 10 MG PO TABS: 10.0000 mg | ORAL_TABLET | Freq: Every evening | ORAL | Status: DC | PRN

## 2014-08-03 NOTE — Addendum Note (Signed)
Addended by: Sharon Seller B on: 08/03/2014 01:59 PM   Modules accepted: Orders

## 2014-08-03 NOTE — Telephone Encounter (Signed)
Printed #30 Zolipdem and put on counter for signature.  PCP said ok for #30 day to Local and over the weekend she would then review chart/message from patient.  Called the patient informed will fax over hardcopy for zolpidem to [place] [place].   Patient stated he is scheduled to see PCP in August and can discuss at that time if needed.

## 2014-08-03 NOTE — Telephone Encounter (Signed)
Patient walked into the office stating that he contacted [facility] and they told him that they never did receive this prescription. He states that now he cannot get a refill through his local pharmacy. He states that he cannot go 90 days w/o this medication. He is requesting that we call [facility] to verify that they did not receive this rx. Patient is requesting a 30 day supply to be sent to [facility] on [address]. Best # to reach patient at [phone]. Patient requesting callback today.

## 2014-08-03 NOTE — Telephone Encounter (Signed)
Advise on refill to Local pharmacy

## 2014-08-04 NOTE — Telephone Encounter (Signed)
Please verify with [facility] that he did not get a prescription from them and then we can resent prescription.

## 2014-08-07 NOTE — Telephone Encounter (Signed)
I did call [facility] and they did not received any refills at all from our office for Zolipdem.

## 2014-08-09 NOTE — Telephone Encounter (Signed)
Called Dakota [name]

## 2014-08-09 NOTE — Telephone Encounter (Signed)
Called [facility] to add 2 refills onto Zolpidem prescription since [facility] not received [facility].  Patient has been informed 2 refills will be available once needed.

## 2014-09-19 ENCOUNTER — Telehealth: Payer: Self-pay

## 2014-09-19 NOTE — Telephone Encounter (Signed)
PVC completed

## 2014-09-20 ENCOUNTER — Encounter: Payer: Self-pay | Admitting: Family Medicine

## 2014-09-20 ENCOUNTER — Ambulatory Visit (INDEPENDENT_AMBULATORY_CARE_PROVIDER_SITE_OTHER): Payer: Managed Care, Other (non HMO) | Admitting: Family Medicine

## 2014-09-20 VITALS — BP 132/82 | HR 87 | Temp 98.3°F | Ht 74.0 in | Wt 191.2 lb

## 2014-09-20 DIAGNOSIS — E785 Hyperlipidemia, unspecified: Secondary | ICD-10-CM

## 2014-09-20 DIAGNOSIS — Z720 Tobacco use: Secondary | ICD-10-CM

## 2014-09-20 DIAGNOSIS — E782 Mixed hyperlipidemia: Secondary | ICD-10-CM

## 2014-09-20 DIAGNOSIS — R739 Hyperglycemia, unspecified: Secondary | ICD-10-CM | POA: Diagnosis not present

## 2014-09-20 DIAGNOSIS — Z0000 Encounter for general adult medical examination without abnormal findings: Secondary | ICD-10-CM

## 2014-09-20 DIAGNOSIS — B351 Tinea unguium: Secondary | ICD-10-CM

## 2014-09-20 DIAGNOSIS — G4700 Insomnia, unspecified: Secondary | ICD-10-CM

## 2014-09-20 DIAGNOSIS — I10 Essential (primary) hypertension: Secondary | ICD-10-CM | POA: Diagnosis not present

## 2014-09-20 DIAGNOSIS — N200 Calculus of kidney: Secondary | ICD-10-CM | POA: Diagnosis not present

## 2014-09-20 LAB — LIPID PANEL
CHOL/HDL RATIO: 4
Cholesterol: 138 mg/dL (ref 0–200)
HDL: 35.8 mg/dL — AB (ref >39.00)
LDL Cholesterol: 73 mg/dL (ref 0–99)
NonHDL: 102.21
Triglycerides: 147 mg/dL (ref 0.0–149.0)
VLDL: 29.4 mg/dL (ref 0.0–40.0)

## 2014-09-20 LAB — CBC
HCT: 42.1 % (ref 39.0–52.0)
HEMOGLOBIN: 14.3 g/dL (ref 13.0–17.0)
MCHC: 34 g/dL (ref 30.0–36.0)
MCV: 90.9 fl (ref 78.0–100.0)
PLATELETS: 253 10*3/uL (ref 150.0–400.0)
RBC: 4.63 Mil/uL (ref 4.22–5.81)
RDW: 13.1 % (ref 11.5–15.5)
WBC: 7.2 10*3/uL (ref 4.0–10.5)

## 2014-09-20 LAB — HEMOGLOBIN A1C: Hgb A1c MFr Bld: 5.8 % (ref 4.6–6.5)

## 2014-09-20 LAB — COMPREHENSIVE METABOLIC PANEL
ALT: 35 U/L (ref 0–53)
AST: 27 U/L (ref 0–37)
Albumin: 4.7 g/dL (ref 3.5–5.2)
Alkaline Phosphatase: 92 U/L (ref 39–117)
BUN: 14 mg/dL (ref 6–23)
CALCIUM: 9.8 mg/dL (ref 8.4–10.5)
CO2: 29 mEq/L (ref 19–32)
Chloride: 102 mEq/L (ref 96–112)
Creatinine, Ser: 0.91 mg/dL (ref 0.40–1.50)
GFR: 92.15 mL/min (ref >60.00)
Glucose, Bld: 93 mg/dL (ref 70–99)
Potassium: 4.4 mEq/L (ref 3.5–5.1)
Sodium: 136 mEq/L (ref 135–145)
Total Bilirubin: 0.7 mg/dL (ref 0.2–1.2)
Total Protein: 7.9 g/dL (ref 6.0–8.3)

## 2014-09-20 LAB — TSH: TSH: 2.38 u[IU]/mL (ref 0.35–4.50)

## 2014-09-20 MED ORDER — BUPROPION HCL ER (XL) 300 MG PO TB24: ORAL_TABLET | ORAL | Status: DC

## 2014-09-20 MED ORDER — CICLOPIROX 8 % EX SOLN: Freq: Every day | CUTANEOUS | Status: DC

## 2014-09-20 MED ORDER — ZOLPIDEM TARTRATE 10 MG PO TABS: 10.0000 mg | ORAL_TABLET | Freq: Every evening | ORAL | Status: DC | PRN

## 2014-09-20 NOTE — Progress Notes (Signed)
Subjective:    Patient ID: Dakota [name], male    DOB: 02-Apr-1960, 53 y.o.   MRN: [id number]  Chief Complaint  Patient presents with  . Annual Exam    HPI Patient is in today for annual exam. Had a recent bad episode with left flank pain, was treated for kidney stone with lithotripsy and is feeling better. No fevers or urinary complaints at this time. Does not like the thick toenails he has. Requests a refill on Ambien for insomnia. Denies CP/palp/SOB/HA/congestion/fevers/GI or GU c/o. Taking meds as prescribed  Past Medical History  Diagnosis Date  . CAD (coronary artery disease)   . Hypertension   . Hyperlipidemia   . Depression   . Chicken pox as a child  . Mumps as a child  . Asthma     childhood  . Allergy     cats  . Anxiety   . Depression 12/25/2012  . Left knee pain 12/25/2012  . 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 Insomnia 06/11/2013  . Preventative health care 09/24/2013    Past Surgical History  Procedure Laterality Date  . Coronary stent placement  01-14-05  . Lithotripsy  2/16    Family History  Problem Relation Age of Onset  . Multiple sclerosis Mother   . Cancer Father     esophagus/ brain  . Alcohol abuse Father     smoker  . Seizures Sister   . COPD Sister   . Alcohol abuse Sister     addiction, recovered  . Heart disease Maternal Grandfather     MI    Social History   Social History  . Marital Status: Legally Separated    Spouse Name: N/A  . Number of Children: N/A  . Years of Education: N/A   Occupational History  . Not on file.   Social History Main Topics  . Smoking status: Current Every Day Smoker -- 0.50 packs/day for 20 years    Types: Cigarettes  . Smokeless tobacco: Never Used  . Alcohol Use: Yes     Comment: 5 or 6 drinks a week   . Drug Use: No  . 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 Not on file   Social  History Narrative    Outpatient Prescriptions Prior to Visit  Medication Sig Dispense Refill  . amLODipine (NORVASC) 5 MG tablet Take 1 tablet (5 mg total) by mouth daily. 90 tablet 3  . aspirin 81 MG tablet Take 81 mg by mouth daily.     Marland Kitchen atorvastatin (LIPITOR) 80 MG tablet Take 1 tablet (80 mg total) by mouth daily. 90 tablet 3  . KRILL OIL PO Take 1 capsule by mouth daily.    Marland Kitchen buPROPion (WELLBUTRIN XL) 300 MG 24 hr tablet TAKE 1 TABLET (300 MG TOTAL) BY MOUTH DAILY. 90 tablet 0  . zolpidem (AMBIEN) 10 MG tablet Take 1 tablet (10 mg total) by mouth at bedtime as needed for sleep. 30 tablet 0  . oxyCODONE-acetaminophen (PERCOCET) 5-325 MG per tablet Take 2 tablets by mouth every 4 (four) hours as needed. (Patient not taking: Reported on 09/19/2014) 20 tablet 0   No facility-administered medications prior to visit.    Allergies  Allergen Reactions  . Neosporin [Neomycin-Bacitracin Zn-Polymyx] Hives and Itching  . Polysporin [Bacitracin-Polymyxin B] Hives and Itching    Review of Systems  Constitutional:  Negative for fever, chills and malaise/fatigue.  HENT: Negative for congestion and hearing loss.   Eyes: Negative for discharge.  Respiratory: Negative for cough, sputum production and shortness of breath.   Cardiovascular: Negative for chest pain, palpitations and leg swelling.  Gastrointestinal: Negative for heartburn, nausea, vomiting, abdominal pain, diarrhea, constipation and blood in stool.  Genitourinary: Negative for dysuria, urgency, frequency and hematuria.  Musculoskeletal: Positive for back pain. Negative for myalgias and falls.  Skin: Negative for rash.  Neurological: Negative for dizziness, sensory change, loss of consciousness, weakness and headaches.  Endo/Heme/Allergies: Negative for environmental allergies. Does not bruise/bleed easily.  Psychiatric/Behavioral: Negative for depression and suicidal ideas. The patient is not nervous/anxious and does not have  insomnia.        Objective:    Physical Exam  Constitutional: He is oriented to person, place, and time. He appears well-developed and well-nourished. No distress.  HENT:  Head: Normocephalic and atraumatic.  Nose: Nose normal.  Eyes: Conjunctivae are normal. Right eye exhibits no discharge. Left eye exhibits no discharge.  Neck: Normal range of motion. Neck supple. No thyromegaly present.  Cardiovascular: Normal rate, regular rhythm and normal heart sounds.   No murmur heard. Pulmonary/Chest: Effort normal and breath sounds normal. No respiratory distress. He has no wheezes.  Abdominal: Soft. Bowel sounds are normal. He exhibits no mass. There is no tenderness.  Musculoskeletal: He exhibits no edema.  Lymphadenopathy:    He has no cervical adenopathy.  Neurological: He is alert and oriented to person, place, and time.  Skin: Skin is warm and dry.  Thick whitened toenails.   Psychiatric: He has a normal mood and affect. His behavior is normal.  Nursing note and vitals reviewed.   BP 132/82 mmHg  Pulse 87  Temp(Src) 98.3 F (36.8 C) (Oral)  Ht 6\' 2"  (1.88 m)  Wt 191 lb 4 oz (86.75 kg)  BMI 24.54 kg/m2  SpO2 94% Wt Readings from Last 3 Encounters:  09/20/14 191 lb 4 oz (86.75 kg)  03/08/14 190 lb (86.183 kg)  02/20/14 193 lb 6.4 oz (87.726 kg)     Lab Results  Component Value Date   WBC 13.9* 03/11/2014   HGB 15.3 03/11/2014   HCT 45.0 03/11/2014   PLT 235 03/11/2014   GLUCOSE 107* 03/11/2014   CHOL 173 09/14/2013   TRIG 121 09/14/2013   HDL 37* 09/14/2013   LDLDIRECT 149.4 09/13/2007   LDLCALC 112* 09/14/2013   ALT 37 03/11/2014   AST 29 03/11/2014   NA 138 03/11/2014   K 4.6 03/11/2014   CL 102 03/11/2014   CREATININE 1.10 03/11/2014   BUN 16 03/11/2014   CO2 26 03/11/2014   TSH 1.73 02/21/2014   PSA 0.63 09/14/2013    Lab Results  Component Value Date   TSH 1.73 02/21/2014   Lab Results  Component Value Date   WBC 13.9* 03/11/2014   HGB 15.3  03/11/2014   HCT 45.0 03/11/2014   MCV 92.2 03/11/2014   PLT 235 03/11/2014   Lab Results  Component Value Date   NA 138 03/11/2014   K 4.6 03/11/2014   CO2 26 03/11/2014   GLUCOSE 107* 03/11/2014   BUN 16 03/11/2014   CREATININE 1.10 03/11/2014   BILITOT 1.0 03/11/2014   ALKPHOS 100 03/11/2014   AST 29 03/11/2014   ALT 37 03/11/2014   PROT 7.8 03/11/2014   ALBUMIN 4.7 03/11/2014   CALCIUM 9.2 03/11/2014   ANIONGAP 8 03/11/2014   GFR 105.62 02/21/2014  Lab Results  Component Value Date   CHOL 173 09/14/2013   Lab Results  Component Value Date   HDL 37* 09/14/2013   Lab Results  Component Value Date   LDLCALC 112* 09/14/2013   Lab Results  Component Value Date   TRIG 121 09/14/2013   Lab Results  Component Value Date   CHOLHDL 4.7 09/14/2013   No results found for: HGBA1C     Assessment & Plan:  Hyperlipidemia, mixed Tolerating statin, encouraged heart healthy diet, avoid trans fats, minimize simple carbs and saturated fats. Increase exercise as tolerated  Essential hypertension Well controlled, no changes to meds. Encouraged heart healthy diet such as the DASH diet and exercise as tolerated.   Kidney stone on left side S/p lithotripsy, is trying to maintain a special diet and increase hydration.   Tobacco abuse Encouraged complete cessation. Discussed need to quit as relates to risk of numerous cancers, cardiac and pulmonary disease as well as neurologic complications. Counseled for greater than 3 minutes  Preventative health care Patient encouraged to ma. Given and reviewed copy of ACP documents from [employer] and encouraged to complete and returnintain heart healthy diet, regular exercise, adequate sleep. Consider daily probiotics. Take medications as prescribed. Labs reviewed  Hyperglycemia hgba1c acceptable, minimize simple carbs. Increase exercise as tolerated.   Insomnia Encouraged good sleep hygiene such as dark, quiet room. No  blue/green glowing lights such as computer screens in bedroom. No alcohol or stimulants in evening. Cut down on caffeine as able. Regular exercise is helpful but not just prior to bed time. May continue Ambien prn  Onychomycosis Try soaking with distilled white vinegar and apply Vick's Vapor Rub daily   I have discontinued Mr. Klippel oxyCODONE-acetaminophen. I am also having him start on ciclopirox. Additionally, I am having him maintain his aspirin, amLODipine, atorvastatin, KRILL OIL PO, zolpidem, and buPROPion.  Meds ordered this encounter  Medications  . zolpidem (AMBIEN) 10 MG tablet    Sig: Take 1 tablet (10 mg total) by mouth at bedtime as needed for sleep.    Dispense:  30 tablet    Refill:  3  . ciclopirox (PENLAC) 8 % solution    Sig: Apply topically at bedtime. Apply over nail and surrounding skin. Apply daily over previous coat. After seven (7) days, may remove with alcohol and continue cycle.    Dispense:  6.6 mL    Refill:  3  . buPROPion (WELLBUTRIN XL) 300 MG 24 hr tablet    Sig: TAKE 1 TABLET (300 MG TOTAL) BY MOUTH DAILY.    Dispense:  90 tablet    Refill:  1    No refills available     Penni Homans, MD

## 2014-09-20 NOTE — Progress Notes (Signed)
Pre visit review using our clinic review tool, if applicable. No additional management support is needed unless otherwise documented below in the visit note. 

## 2014-09-20 NOTE — Assessment & Plan Note (Signed)
Tolerating statin, encouraged heart healthy diet, avoid trans fats, minimize simple carbs and saturated fats. Increase exercise as tolerated 

## 2014-09-20 NOTE — Patient Instructions (Addendum)
Preventive Care for Adults A healthy lifestyle and preventive care can promote health and wellness. Preventive health guidelines for men include the following key practices:  A routine yearly physical is a good way to check with your health care provider about your health and preventative screening. It is a chance to share any concerns and updates on your health and to receive a thorough exam.  Visit your dentist for a routine exam and preventative care every 6 months. Brush your teeth twice a day and floss once a day. Good oral hygiene prevents tooth decay and gum disease.  The frequency of eye exams is based on your age, health, family medical history, use of contact lenses, and other factors. Follow your health care provider's recommendations for frequency of eye exams.  Eat a healthy diet. Foods such as vegetables, fruits, whole grains, low-fat dairy products, and lean protein foods contain the nutrients you need without too many calories. Decrease your intake of foods high in solid fats, added sugars, and salt. Eat the right amount of calories for you.Get information about a proper diet from your health care provider, if necessary.  Regular physical exercise is one of the most important things you can do for your health. Most adults should get at least 150 minutes of moderate-intensity exercise (any activity that increases your heart rate and causes you to sweat) each week. In addition, most adults need muscle-strengthening exercises on 2 or more days a week.  Maintain a healthy weight. The body mass index (BMI) is a screening tool to identify possible weight problems. It provides an estimate of body fat based on height and weight. Your health care provider can find your BMI and can help you achieve or maintain a healthy weight.For adults 20 years and older:  A BMI below 18.5 is considered underweight.  A BMI of 18.5 to 24.9 is normal.  A BMI of 25 to 29.9 is considered overweight.  A BMI  of 30 and above is considered obese.  Maintain normal blood lipids and cholesterol levels by exercising and minimizing your intake of saturated fat. Eat a balanced diet with plenty of fruit and vegetables. Blood tests for lipids and cholesterol should begin at age 50 and be repeated every 5 years. If your lipid or cholesterol levels are high, you are over 50, or you are at high risk for heart disease, you may need your cholesterol levels checked more frequently.Ongoing high lipid and cholesterol levels should be treated with medicines if diet and exercise are not working.  If you smoke, find out from your health care provider how to quit. If you do not use tobacco, do not start.  Lung cancer screening is recommended for adults aged 73-80 years who are at high risk for developing lung cancer because of a history of smoking. A yearly low-dose CT scan of the lungs is recommended for people who have at least a 30-pack-year history of smoking and are a current smoker or have quit within the past 15 years. A pack year of smoking is smoking an average of 1 pack of cigarettes a day for 1 year (for example: 1 pack a day for 30 years or 2 packs a day for 15 years). Yearly screening should continue until the smoker has stopped smoking for at least 15 years. Yearly screening should be stopped for people who develop a health problem that would prevent them from having lung cancer treatment.  If you choose to drink alcohol, do not have more than  2 drinks per day. One drink is considered to be 12 ounces (355 mL) of beer, 5 ounces (148 mL) of wine, or 1.5 ounces (44 mL) of liquor.  Avoid use of street drugs. Do not share needles with anyone. Ask for help if you need support or instructions about stopping the use of drugs.  High blood pressure causes heart disease and increases the risk of stroke. Your blood pressure should be checked at least every 1-2 years. Ongoing high blood pressure should be treated with  medicines, if weight loss and exercise are not effective.  If you are 28-34 years old, ask your health care provider if you should take aspirin to prevent heart disease.  Diabetes screening involves taking a blood sample to check your fasting blood sugar level. This should be done once every 3 years, after age 13, if you are within normal weight and without risk factors for diabetes. Testing should be considered at a younger age or be carried out more frequently if you are overweight and have at least 1 risk factor for diabetes.  Colorectal cancer can be detected and often prevented. Most routine colorectal cancer screening begins at the age of 88 and continues through age 73. However, your health care provider may recommend screening at an earlier age if you have risk factors for colon cancer. On a yearly basis, your health care provider may provide home test kits to check for hidden blood in the stool. Use of a small camera at the end of a tube to directly examine the colon (sigmoidoscopy or colonoscopy) can detect the earliest forms of colorectal cancer. Talk to your health care provider about this at age 80, when routine screening begins. Direct exam of the colon should be repeated every 5-10 years through age 31, unless early forms of precancerous polyps or small growths are found.  People who are at an increased risk for hepatitis B should be screened for this virus. You are considered at high risk for hepatitis B if:  You were born in a country where hepatitis B occurs often. Talk with your health care provider about which countries are considered high risk.  Your parents were born in a high-risk country and you have not received a shot to protect against hepatitis B (hepatitis B vaccine).  You have HIV or AIDS.  You use needles to inject street drugs.  You live with, or have sex with, someone who has hepatitis B.  You are a man who has sex with other men (MSM).  You get hemodialysis  treatment.  You take certain medicines for conditions such as cancer, organ transplantation, and autoimmune conditions.  Hepatitis C blood testing is recommended for all people born from [birth year] through 1965 and any individual with known risks for hepatitis C.  Practice safe sex. Use condoms and avoid high-risk sexual practices to reduce the spread of sexually transmitted infections (STIs). STIs include gonorrhea, chlamydia, syphilis, trichomonas, herpes, HPV, and human immunodeficiency virus (HIV). Herpes, HIV, and HPV are viral illnesses that have no cure. They can result in disability, cancer, and death.  If you are at risk of being infected with HIV, it is recommended that you take a prescription medicine daily to prevent HIV infection. This is called preexposure prophylaxis (PrEP). You are considered at risk if:  You are a man who has sex with other men (MSM) and have other risk factors.  You are a heterosexual man, are sexually active, and are at increased risk for HIV infection.  You take drugs by injection.  You are sexually active with a partner who has HIV.  Talk with your health care provider about whether you are at high risk of being infected with HIV. If you choose to begin PrEP, you should first be tested for HIV. You should then be tested every 3 months for as long as you are taking PrEP.  A one-time screening for abdominal aortic aneurysm (AAA) and surgical repair of large AAAs by ultrasound are recommended for men ages 32 to 67 years who are current or former smokers.  Healthy men should no longer receive prostate-specific antigen (PSA) blood tests as part of routine cancer screening. Talk with your health care provider about prostate cancer screening.  Testicular cancer screening is not recommended for adult males who have no symptoms. Screening includes self-exam, a health care provider exam, and other screening tests. Consult with your health care provider about any symptoms  you have or any concerns you have about testicular cancer.  Use sunscreen. Apply sunscreen liberally and repeatedly throughout the day. You should seek shade when your shadow is shorter than you. Protect yourself by wearing long sleeves, pants, a wide-brimmed hat, and sunglasses year round, whenever you are outdoors.  Once a month, do a whole-body skin exam, using a mirror to look at the skin on your back. Tell your health care provider about new moles, moles that have irregular borders, moles that are larger than a pencil eraser, or moles that have changed in shape or color.  Stay current with required vaccines (immunizations).  Influenza vaccine. All adults should be immunized every year.  Tetanus, diphtheria, and acellular pertussis (Td, Tdap) vaccine. An adult who has not previously received Tdap or who does not know his vaccine status should receive 1 dose of Tdap. This initial dose should be followed by tetanus and diphtheria toxoids (Td) booster doses every 10 years. Adults with an unknown or incomplete history of completing a 3-dose immunization series with Td-containing vaccines should begin or complete a primary immunization series including a Tdap dose. Adults should receive a Td booster every 10 years.  Varicella vaccine. An adult without evidence of immunity to varicella should receive 2 doses or a second dose if he has previously received 1 dose.  Human papillomavirus (HPV) vaccine. Males aged 68-21 years who have not received the vaccine previously should receive the 3-dose series. Males aged 22-26 years may be immunized. Immunization is recommended through the age of 6 years for any male who has sex with males and did not get any or all doses earlier. Immunization is recommended for any person with an immunocompromised condition through the age of 49 years if he did not get any or all doses earlier. During the 3-dose series, the second dose should be obtained 4-8 weeks after the first  dose. The third dose should be obtained 24 weeks after the first dose and 16 weeks after the second dose.  Zoster vaccine. One dose is recommended for adults aged 50 years or older unless certain conditions are present.  Measles, mumps, and rubella (MMR) vaccine. Adults born before [birth year] generally are considered immune to measles and mumps. Adults born in [birth year] or later should have 1 or more doses of MMR vaccine unless there is a contraindication to the vaccine or there is laboratory evidence of immunity to each of the three diseases. A routine second dose of MMR vaccine should be obtained at least 28 days after the first dose for students attending postsecondary  schools, health care workers, or international travelers. People who received inactivated measles vaccine or an unknown type of measles vaccine during 1963-1967 should receive 2 doses of MMR vaccine. People who received inactivated mumps vaccine or an unknown type of mumps vaccine before 1979 and are at high risk for mumps infection should consider immunization with 2 doses of MMR vaccine. Unvaccinated health care workers born before 1957 who lack laboratory evidence of measles, mumps, or rubella immunity or laboratory confirmation of disease should consider measles and mumps immunization with 2 doses of MMR vaccine or rubella immunization with 1 dose of MMR vaccine.  Pneumococcal 13-valent conjugate (PCV13) vaccine. When indicated, a person who is uncertain of his immunization history and has no record of immunization should receive the PCV13 vaccine. An adult aged 19 years or older who has certain medical conditions and has not been previously immunized should receive 1 dose of PCV13 vaccine. This PCV13 should be followed with a dose of pneumococcal polysaccharide (PPSV23) vaccine. The PPSV23 vaccine dose should be obtained at least 8 weeks after the dose of PCV13 vaccine. An adult aged 19 years or older who has certain medical conditions and  previously received 1 or more doses of PPSV23 vaccine should receive 1 dose of PCV13. The PCV13 vaccine dose should be obtained 1 or more years after the last PPSV23 vaccine dose.  Pneumococcal polysaccharide (PPSV23) vaccine. When PCV13 is also indicated, PCV13 should be obtained first. All adults aged 65 years and older should be immunized. An adult younger than age 65 years who has certain medical conditions should be immunized. Any person who resides in a nursing home or long-term care facility should be immunized. An adult smoker should be immunized. People with an immunocompromised condition and certain other conditions should receive both PCV13 and PPSV23 vaccines. People with human immunodeficiency virus (HIV) infection should be immunized as soon as possible after diagnosis. Immunization during chemotherapy or radiation therapy should be avoided. Routine use of PPSV23 vaccine is not recommended for American Indians, [place] Natives, or people younger than 65 years unless there are medical conditions that require PPSV23 vaccine. When indicated, people who have unknown immunization and have no record of immunization should receive PPSV23 vaccine. One-time revaccination 5 years after the first dose of PPSV23 is recommended for people aged 19-64 years who have chronic kidney failure, nephrotic syndrome, asplenia, or immunocompromised conditions. People who received 1-2 doses of PPSV23 before age 65 years should receive another dose of PPSV23 vaccine at age 65 years or later if at least 5 years have passed since the previous dose. Doses of PPSV23 are not needed for people immunized with PPSV23 at or after age 65 years.  Meningococcal vaccine. Adults with asplenia or persistent complement component deficiencies should receive 2 doses of quadrivalent meningococcal conjugate (MenACWY-D) vaccine. The doses should be obtained at least 2 months apart. Microbiologists working with certain meningococcal bacteria,  military recruits, people at risk during an outbreak, and people who travel to or live in countries with a high rate of meningitis should be immunized. A first-year college student up through age 21 years who is living in a residence hall should receive a dose if he did not receive a dose on or after his 16th birthday. Adults who have certain high-risk conditions should receive one or more doses of vaccine.  Hepatitis A vaccine. Adults who wish to be protected from this disease, have certain high-risk conditions, work with hepatitis A-infected animals, work in hepatitis A research labs, or   travel to or work in countries with a high rate of hepatitis A should be immunized. Adults who were previously unvaccinated and who anticipate close contact with an international adoptee during the first 60 days after arrival in the [place] from a country with a high rate of hepatitis A should be immunized.  Hepatitis B vaccine. Adults should be immunized if they wish to be protected from this disease, have certain high-risk conditions, may be exposed to blood or other infectious body fluids, are household contacts or sex partners of hepatitis B positive people, are clients or workers in certain care facilities, or travel to or work in countries with a high rate of hepatitis B.  Haemophilus influenzae type b (Hib) vaccine. A previously unvaccinated person with asplenia or sickle cell disease or having a scheduled splenectomy should receive 1 dose of Hib vaccine. Regardless of previous immunization, a recipient of a hematopoietic stem cell transplant should receive a 3-dose series 6-12 months after his successful transplant. Hib vaccine is not recommended for adults with HIV infection. Preventive Service / Frequency Ages 52 to 17  Blood pressure check.** / Every 1 to 2 years.  Lipid and cholesterol check.** / Every 5 years beginning at age 69.  Hepatitis C blood test.** / For any individual with known risks for  hepatitis C.  Skin self-exam. / Monthly.  Influenza vaccine. / Every year.  Tetanus, diphtheria, and acellular pertussis (Tdap, Td) vaccine.** / Consult your health care provider. 1 dose of Td every 10 years.  Varicella vaccine.** / Consult your health care provider.  HPV vaccine. / 3 doses over 6 months, if 72 or younger.  Measles, mumps, rubella (MMR) vaccine.** / You need at least 1 dose of MMR if you were born in 1957 or later. You may also need a second dose.  Pneumococcal 13-valent conjugate (PCV13) vaccine.** / Consult your health care provider.  Pneumococcal polysaccharide (PPSV23) vaccine.** / 1 to 2 doses if you smoke cigarettes or if you have certain conditions.  Meningococcal vaccine.** / 1 dose if you are age 35 to 60 years and a Market researcher living in a residence hall, or have one of several medical conditions. You may also need additional booster doses.  Hepatitis A vaccine.** / Consult your health care provider.  Hepatitis B vaccine.** / Consult your health care provider.  Haemophilus influenzae type b (Hib) vaccine.** / Consult your health care provider. Ages 35 to 8  Blood pressure check.** / Every 1 to 2 years.  Lipid and cholesterol check.** / Every 5 years beginning at age 57.  Lung cancer screening. / Every year if you are aged 44-80 years and have a 30-pack-year history of smoking and currently smoke or have quit within the past 15 years. Yearly screening is stopped once you have quit smoking for at least 15 years or develop a health problem that would prevent you from having lung cancer treatment.  Fecal occult blood test (FOBT) of stool. / Every year beginning at age 55 and continuing until age 73. You may not have to do this test if you get a colonoscopy every 10 years.  Flexible sigmoidoscopy** or colonoscopy.** / Every 5 years for a flexible sigmoidoscopy or every 10 years for a colonoscopy beginning at age 28 and continuing until age  1.  Hepatitis C blood test.** / For all people born from [birth year] through 1965 and any individual with known risks for hepatitis C.  Skin self-exam. / Monthly.  Influenza vaccine. / Every  year.  Tetanus, diphtheria, and acellular pertussis (Tdap/Td) vaccine.** / Consult your health care provider. 1 dose of Td every 10 years.  Varicella vaccine.** / Consult your health care provider.  Zoster vaccine.** / 1 dose for adults aged 25 years or older.  Measles, mumps, rubella (MMR) vaccine.** / You need at least 1 dose of MMR if you were born in 1957 or later. You may also need a second dose.  Pneumococcal 13-valent conjugate (PCV13) vaccine.** / Consult your health care provider.  Pneumococcal polysaccharide (PPSV23) vaccine.** / 1 to 2 doses if you smoke cigarettes or if you have certain conditions.  Meningococcal vaccine.** / Consult your health care provider.  Hepatitis A vaccine.** / Consult your health care provider.  Hepatitis B vaccine.** / Consult your health care provider.  Haemophilus influenzae type b (Hib) vaccine.** / Consult your health care provider. Ages 45 and over  Blood pressure check.** / Every 1 to 2 years.  Lipid and cholesterol check.**/ Every 5 years beginning at age 52.  Lung cancer screening. / Every year if you are aged 54-80 years and have a 30-pack-year history of smoking and currently smoke or have quit within the past 15 years. Yearly screening is stopped once you have quit smoking for at least 15 years or develop a health problem that would prevent you from having lung cancer treatment.  Fecal occult blood test (FOBT) of stool. / Every year beginning at age 49 and continuing until age 26. You may not have to do this test if you get a colonoscopy every 10 years.  Flexible sigmoidoscopy** or colonoscopy.** / Every 5 years for a flexible sigmoidoscopy or every 10 years for a colonoscopy beginning at age 36 and continuing until age 20.  Hepatitis C blood  test.** / For all people born from [birth year] through 1965 and any individual with known risks for hepatitis C.  Abdominal aortic aneurysm (AAA) screening.** / A one-time screening for ages 90 to 85 years who are current or former smokers.  Skin self-exam. / Monthly.  Influenza vaccine. / Every year.  Tetanus, diphtheria, and acellular pertussis (Tdap/Td) vaccine.** / 1 dose of Td every 10 years.  Varicella vaccine.** / Consult your health care provider.  Zoster vaccine.** / 1 dose for adults aged 37 years or older.  Pneumococcal 13-valent conjugate (PCV13) vaccine.** / Consult your health care provider.  Pneumococcal polysaccharide (PPSV23) vaccine.** / 1 dose for all adults aged 61 years and older.  Meningococcal vaccine.** / Consult your health care provider.  Hepatitis A vaccine.** / Consult your health care provider.  Hepatitis B vaccine.** / Consult your health care provider.  Haemophilus influenzae type b (Hib) vaccine.** / Consult your health care provider. **Family history and personal history of risk and conditions may change your health care provider's recommendations. Document Released: 03/17/2001 Document Revised: 01/24/2013 Document Reviewed: 06/16/2010 [facility] Patient Information 2015 [place], [place]. This information is not intended to replace advice given to you by your health care provider. Make sure you discuss any questions you have with your health care provider.

## 2014-09-30 ENCOUNTER — Encounter: Payer: Self-pay | Admitting: Family Medicine

## 2014-09-30 DIAGNOSIS — R739 Hyperglycemia, unspecified: Secondary | ICD-10-CM | POA: Insufficient documentation

## 2014-09-30 DIAGNOSIS — B351 Tinea unguium: Secondary | ICD-10-CM

## 2014-09-30 HISTORY — DX: Tinea unguium: B35.1

## 2014-09-30 NOTE — Assessment & Plan Note (Signed)
Patient encouraged to ma. Given and reviewed copy of ACP documents from [employer] and encouraged to complete and returnintain heart healthy diet, regular exercise, adequate sleep. Consider daily probiotics. Take medications as prescribed. Labs reviewed

## 2014-09-30 NOTE — Assessment & Plan Note (Signed)
hgba1c acceptable, minimize simple carbs. Increase exercise as tolerated.  

## 2014-09-30 NOTE — Assessment & Plan Note (Signed)
Encouraged good sleep hygiene such as dark, quiet room. No blue/green glowing lights such as computer screens in bedroom. No alcohol or stimulants in evening. Cut down on caffeine as able. Regular exercise is helpful but not just prior to bed time. May continue Ambien prn

## 2014-09-30 NOTE — Assessment & Plan Note (Signed)
S/p lithotripsy, is trying to maintain a special diet and increase hydration.

## 2014-09-30 NOTE — Assessment & Plan Note (Signed)
Encouraged complete cessation. Discussed need to quit as relates to risk of numerous cancers, cardiac and pulmonary disease as well as neurologic complications. Counseled for greater than 3 minutes 

## 2014-09-30 NOTE — Assessment & Plan Note (Signed)
Try soaking with distilled white vinegar and apply Vick's Vapor Rub daily

## 2014-09-30 NOTE — Assessment & Plan Note (Signed)
Well controlled, no changes to meds. Encouraged heart healthy diet such as the DASH diet and exercise as tolerated.  °

## 2014-10-23 ENCOUNTER — Telehealth: Payer: Self-pay | Admitting: *Deleted

## 2014-10-23 NOTE — Telephone Encounter (Signed)
Pt dropped off health screening form. Completed and forwarded to Dr. Charlett Blake for signature. JG//CMA

## 2014-10-26 NOTE — Telephone Encounter (Signed)
Faxed successfully to [phone] and sent for scanning. JG/CMA

## 2014-12-25 NOTE — Progress Notes (Signed)
HPI: FU coronary artery disease. 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 Cath revealed normal LM. Left anterior descending artery had 30% lesion in the proximal and mid-vessel. Lcx with 60 % lesion. There were left-to-right collaterals to the RCA. Right coronary artery was dominant and was 100% midvessel occlusion after the takeoff of a medium-sized RV branch; inferobasal wall akinesis. EF was 50%. He underwent PCI of the right coronary artery with bare-metal stent. EF was 50%. Stress echocardiogram in February 2013 was normal. 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 Since last seen, the patient has dyspnea with more extreme activities but not with routine activities. It is relieved with rest. It is not associated with chest pain. There is no orthopnea, PND or pedal edema. There is no syncope or palpitations. There is no exertional chest pain.   Current Outpatient Prescriptions  Medication Sig Dispense Refill  . amLODipine (NORVASC) 5 MG tablet Take 1 tablet (5 mg total) by mouth daily. 90 tablet 3  . aspirin 81 MG tablet Take 81 mg by mouth daily.     Marland Kitchen atorvastatin (LIPITOR) 80 MG tablet Take 1 tablet (80 mg total) by mouth daily. 90 tablet 3  . buPROPion (WELLBUTRIN XL) 300 MG 24 hr tablet TAKE 1 TABLET (300 MG TOTAL) BY MOUTH DAILY. 90 tablet 1  . ciclopirox (PENLAC) 8 % solution Apply topically at bedtime. Apply over nail and surrounding skin. Apply daily over previous coat. After seven (7) days, may remove with alcohol and continue cycle. 6.6 mL 3  . KRILL OIL PO Take 1 capsule by mouth daily.    Marland Kitchen PROBIOTIC PRODUCT PO Take 1 capsule by mouth daily. *Digestive Probiotic*    . zolpidem (AMBIEN) 10 MG tablet Take 1 tablet (10 mg total) by mouth at bedtime as needed for sleep. 30 tablet 3   No current facility-administered medications for this visit.     Past Medical History  Diagnosis Date  . CAD (coronary artery disease)    . Hypertension   . Hyperlipidemia   . Depression   . Chicken pox as a child  . Mumps as a child  . Asthma     childhood  . Allergy     cats  . Anxiety   . Depression 12/25/2012  . Left knee pain 12/25/2012  . 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 Insomnia 06/11/2013  . Preventative health care 09/24/2013  . Kidney stone on left side 09/19/2013  . Onychomycosis 09/30/2014    Past Surgical History  Procedure Laterality Date  . Coronary stent placement  01-14-05  . Lithotripsy  2/16    Social History   Social History  . Marital Status: Legally Separated    Spouse Name: N/A  . Number of Children: N/A  . Years of Education: N/A   Occupational History  . Not on file.   Social History Main Topics  . Smoking status: Current Every Day Smoker -- 0.50 packs/day for 20 years    Types: Cigarettes  . Smokeless tobacco: Never Used  . Alcohol Use: Yes     Comment: 5 or 6 drinks a week   . Drug Use: No  . 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 Not on file   Social History Narrative    ROS: Knee arthralgias but no fevers or  chills, productive cough, hemoptysis, dysphasia, odynophagia, melena, hematochezia, dysuria, hematuria, rash, seizure activity, orthopnea, PND, pedal edema, claudication. Remaining systems are negative.  Physical Exam: Well-developed well-nourished in no acute distress.  Skin is warm and dry.  HEENT is normal.  Neck is supple.  Chest is clear to auscultation with normal expansion.  Cardiovascular exam is regular rate and rhythm.  Abdominal exam nontender or distended. No masses palpated. Extremities show no edema. neuro grossly intact  ECG Sinus rhythm at a rate of 75. No ST changes.

## 2015-01-01 ENCOUNTER — Ambulatory Visit (INDEPENDENT_AMBULATORY_CARE_PROVIDER_SITE_OTHER): Payer: Managed Care, Other (non HMO) | Admitting: Cardiology

## 2015-01-01 ENCOUNTER — Encounter: Payer: Self-pay | Admitting: Cardiology

## 2015-01-01 VITALS — BP 124/74 | HR 75 | Ht 72.0 in | Wt 196.3 lb

## 2015-01-01 DIAGNOSIS — Z720 Tobacco use: Secondary | ICD-10-CM

## 2015-01-01 DIAGNOSIS — E782 Mixed hyperlipidemia: Secondary | ICD-10-CM | POA: Diagnosis not present

## 2015-01-01 DIAGNOSIS — I2510 Atherosclerotic heart disease of native coronary artery without angina pectoris: Secondary | ICD-10-CM | POA: Diagnosis not present

## 2015-01-01 DIAGNOSIS — I10 Essential (primary) hypertension: Secondary | ICD-10-CM | POA: Diagnosis not present

## 2015-01-01 DIAGNOSIS — I679 Cerebrovascular disease, unspecified: Secondary | ICD-10-CM | POA: Diagnosis not present

## 2015-01-01 NOTE — Assessment & Plan Note (Signed)
Patient counseled on discontinuing. 

## 2015-01-01 NOTE — Assessment & Plan Note (Signed)
Continue statin. 

## 2015-01-01 NOTE — Assessment & Plan Note (Signed)
Continue aspirin and statin. 

## 2015-01-01 NOTE — Patient Instructions (Signed)
Your physician wants you to follow-up in: ONE YEAR WITH DR CRENSHAW You will receive a reminder letter in the mail two months in advance. If you don't receive a letter, please call our office to schedule the follow-up appointment.   If you need a refill on your cardiac medications before your next appointment, please call your pharmacy.  

## 2015-01-01 NOTE — Assessment & Plan Note (Signed)
Blood pressure controlled. Continue present medications. 

## 2015-01-01 NOTE — Assessment & Plan Note (Addendum)
Pt would like to defer fu carotid Dopplers until January 2018. Continue aspirin and statin.

## 2015-01-07 ENCOUNTER — Telehealth: Payer: Self-pay | Admitting: Cardiology

## 2015-01-07 NOTE — Telephone Encounter (Signed)
New message      Pt want to know what was his bp reading at his last ov? And, he forgot to ask for a flu shot.  Can he come by and get one?

## 2015-01-07 NOTE — Telephone Encounter (Signed)
124/74 blood pressure at last visit  Informed patient , office does not have flu shot injection at present time  May go to pharmacy or pcp Patient verbalized understanding.

## 2015-01-15 ENCOUNTER — Ambulatory Visit (INDEPENDENT_AMBULATORY_CARE_PROVIDER_SITE_OTHER): Payer: Managed Care, Other (non HMO)

## 2015-01-15 DIAGNOSIS — Z23 Encounter for immunization: Secondary | ICD-10-CM

## 2015-03-01 ENCOUNTER — Other Ambulatory Visit: Payer: Self-pay | Admitting: Cardiology

## 2015-03-01 NOTE — Telephone Encounter (Signed)
Rx request sent to pharmacy.  

## 2015-03-11 ENCOUNTER — Other Ambulatory Visit: Payer: Self-pay | Admitting: Family Medicine

## 2015-03-11 NOTE — Telephone Encounter (Signed)
Ok to refill Ambien with #30 and 1 rf but he will need to be seen before he gets more since it has been 6 months since he was seen

## 2015-03-12 NOTE — Telephone Encounter (Signed)
  Printed and on counter for signature. Faxed to [facility]. Patient informed done/PCP instructions.

## 2015-03-31 ENCOUNTER — Other Ambulatory Visit: Payer: Self-pay | Admitting: Cardiology

## 2015-04-01 NOTE — Telephone Encounter (Signed)
Rx request sent to pharmacy.  

## 2015-04-30 ENCOUNTER — Other Ambulatory Visit: Payer: Self-pay | Admitting: Family Medicine

## 2015-05-12 ENCOUNTER — Other Ambulatory Visit: Payer: Self-pay | Admitting: Family Medicine

## 2015-05-16 ENCOUNTER — Encounter: Payer: Self-pay | Admitting: Family Medicine

## 2015-05-16 MED ORDER — ZOLPIDEM TARTRATE 10 MG PO TABS: 10.0000 mg | ORAL_TABLET | Freq: Every evening | ORAL | Status: DC | PRN

## 2015-05-16 NOTE — Telephone Encounter (Signed)
Dr Lorelei Pont-- please advise in Dr Frederik Pear absence?  Medication:  Ambien Last OV:  09/20/14. Pt was due for 6 month f/u in February                 and is past due. UDS:  None on file Last refill:  03/12/15, #30 x 1 refill

## 2015-06-25 ENCOUNTER — Encounter: Payer: Self-pay | Admitting: Family Medicine

## 2015-06-25 ENCOUNTER — Ambulatory Visit (INDEPENDENT_AMBULATORY_CARE_PROVIDER_SITE_OTHER): Payer: Managed Care, Other (non HMO) | Admitting: Family Medicine

## 2015-06-25 VITALS — BP 120/84 | HR 65 | Temp 97.9°F | Ht 72.0 in | Wt 203.2 lb

## 2015-06-25 DIAGNOSIS — R739 Hyperglycemia, unspecified: Secondary | ICD-10-CM

## 2015-06-25 DIAGNOSIS — G4700 Insomnia, unspecified: Secondary | ICD-10-CM

## 2015-06-25 DIAGNOSIS — I10 Essential (primary) hypertension: Secondary | ICD-10-CM

## 2015-06-25 DIAGNOSIS — E785 Hyperlipidemia, unspecified: Secondary | ICD-10-CM

## 2015-06-25 DIAGNOSIS — E782 Mixed hyperlipidemia: Secondary | ICD-10-CM

## 2015-06-25 DIAGNOSIS — B351 Tinea unguium: Secondary | ICD-10-CM

## 2015-06-25 DIAGNOSIS — Z720 Tobacco use: Secondary | ICD-10-CM

## 2015-06-25 MED ORDER — CICLOPIROX 8 % EX SOLN: Freq: Every day | CUTANEOUS | Status: DC

## 2015-06-25 MED ORDER — ZOLPIDEM TARTRATE 10 MG PO TABS: 10.0000 mg | ORAL_TABLET | Freq: Every evening | ORAL | Status: DC | PRN

## 2015-06-25 NOTE — Patient Instructions (Signed)
Soak in distilled white vinegar and hot water 15 minutes each night Vick's Vapor Rub  Nail Ringworm A fungal infection of the nail (tinea unguium/onychomycosis) is common. It is common as the visible part of the nail is composed of dead cells which have no blood supply to help prevent infection. It occurs because fungi are everywhere and will pick any opportunity to grow on any dead material. Because nails are very slow growing they require up to 2 years of treatment with anti-fungal medications. The entire nail back to the base is infected. This includes approximately  of the nail which you cannot see. If your caregiver has prescribed a medication by mouth, take it every day and as directed. No progress will be seen for at least 6 to 9 months. Do not be disappointed! Because fungi live on dead cells with little or no exposure to blood supply, medication delivery to the infection is slow; thus the cure is slow. It is also why you can observe no progress in the first 6 months. The nail becoming cured is the base of the nail, as it has the blood supply. Topical medication such as creams and ointments are usually not effective. Important in successful treatment of nail fungus is closely following the medication regimen that your doctor prescribes. Sometimes you and your caregiver may elect to speed up this process by surgical removal of all the nails. Even this may still require 6 to 9 months of additional oral medications. See your caregiver as directed. Remember there will be no visible improvement for at least 6 months. See your caregiver sooner if other signs of infection (redness and swelling) develop.   This information is not intended to replace advice given to you by your health care provider. Make sure you discuss any questions you have with your health care provider.   Document Released: 01/17/2000 Document Revised: 06/05/2014 Document Reviewed: 07/23/2014 Elsevier Interactive Patient Education  [employer].

## 2015-06-25 NOTE — Progress Notes (Signed)
Pre visit review using our clinic review tool, if applicable. No additional management support is needed unless otherwise documented below in the visit note. 

## 2015-07-01 NOTE — Progress Notes (Signed)
Patient ID: Dakota [name], male   DOB: 05/11/1960, 55 y.o.   MRN: [id number]   Subjective:    Patient ID: Dakota [name], male    DOB: [birth year], 55 y.o.   MRN: [id number]  Chief Complaint  Patient presents with  . Follow-up    HPI Patient is in today for follow up. He is hoping for refills on his medications. He notes he uses zolpidem with good results at times. No recent illness or acute concerns. He has not smoked roughly 4 months now. Denies CP/palp/SOB/HA/congestion/fevers/GI or GU c/o. Taking meds as prescribed  Past Medical History  Diagnosis Date  . CAD (coronary artery disease)   . Hypertension   . Hyperlipidemia   . Depression   . Chicken pox as a child  . Mumps as a child  . Asthma     childhood  . Allergy     cats  . Anxiety   . Depression 12/25/2012  . Left knee pain 12/25/2012  . 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 Insomnia 06/11/2013  . Preventative health care 09/24/2013  . Kidney stone on left side 09/19/2013  . Onychomycosis 09/30/2014    Past Surgical History  Procedure Laterality Date  . Coronary stent placement  01-14-05  . Lithotripsy  2/16    Family History  Problem Relation Age of Onset  . Multiple sclerosis Mother   . Cancer Father     esophagus/ brain  . Alcohol abuse Father     smoker  . Seizures Sister   . COPD Sister   . Alcohol abuse Sister     addiction, recovered  . Heart disease Maternal Grandfather     MI    Social History   Social History  . Marital Status: Legally Separated    Spouse Name: N/A  . Number of Children: N/A  . Years of Education: N/A   Occupational History  . Not on file.   Social History Main Topics  . Smoking status: Current Every Day Smoker -- 0.50 packs/day for 20 years    Types: Cigarettes  . Smokeless tobacco: Never Used  . Alcohol Use: Yes     Comment: 5 or 6 drinks a week   . Drug Use: No  . 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 Not on file   Social History Narrative    Outpatient Prescriptions Prior to Visit  Medication Sig Dispense Refill  . amLODipine (NORVASC) 5 MG tablet TAKE 1 TABLET DAILY 90 tablet 2  . atorvastatin (LIPITOR) 80 MG tablet TAKE 1 TABLET DAILY 90 tablet 2  . buPROPion (WELLBUTRIN XL) 300 MG 24 hr tablet TAKE 1 TABLET DAILY 90 tablet 0  . KRILL OIL PO Take 1 capsule by mouth daily.    Marland Kitchen PROBIOTIC PRODUCT PO Take 1 capsule by mouth daily. *Digestive Probiotic*    . ciclopirox (PENLAC) 8 % solution Apply topically at bedtime. Apply over nail and surrounding skin. Apply daily over previous coat. After seven (7) days, may remove with alcohol and continue cycle. 6.6 mL 3  . zolpidem (AMBIEN) 10 MG tablet Take 1 tablet (10 mg total) by mouth at bedtime as needed. for sleep 30 tablet 0  . aspirin 81 MG tablet Take 81 mg by mouth daily. Reported on 06/25/2015     No facility-administered medications prior to visit.    Allergies  Allergen Reactions  . Neosporin [Neomycin-Bacitracin Zn-Polymyx] Hives and Itching  . Polysporin [Bacitracin-Polymyxin B] Hives and Itching    Review of Systems  Constitutional: Negative for fever and malaise/fatigue.  HENT: Negative for congestion.   Eyes: Negative for blurred vision.  Respiratory: Negative for shortness of breath.   Cardiovascular: Negative for chest pain, palpitations and leg swelling.  Gastrointestinal: Negative for nausea, abdominal pain and blood in stool.  Genitourinary: Negative for dysuria and frequency.  Musculoskeletal: Negative for falls.  Skin: Negative for rash.  Neurological: Negative for dizziness, loss of consciousness and headaches.  Endo/Heme/Allergies: Negative for environmental allergies.  Psychiatric/Behavioral: Negative for depression. The patient is not nervous/anxious.        Objective:    Physical Exam  Constitutional: He is oriented to person, place, and time. He  appears well-developed and well-nourished. No distress.  HENT:  Head: Normocephalic and atraumatic.  Nose: Nose normal.  Eyes: Right eye exhibits no discharge. Left eye exhibits no discharge.  Neck: Normal range of motion. Neck supple.  Cardiovascular: Normal rate and regular rhythm.   No murmur heard. Pulmonary/Chest: Effort normal and breath sounds normal.  Abdominal: Soft. Bowel sounds are normal. There is no tenderness.  Musculoskeletal: He exhibits no edema.  Neurological: He is alert and oriented to person, place, and time.  Skin: Skin is warm and dry.  Psychiatric: He has a normal mood and affect.  Nursing note and vitals reviewed.   BP 120/84 mmHg  Pulse 65  Temp(Src) 97.9 F (36.6 C) (Oral)  Ht 6' (1.829 m)  Wt 203 lb 4 oz (92.194 kg)  BMI 27.56 kg/m2  SpO2 97% Wt Readings from Last 3 Encounters:  06/25/15 203 lb 4 oz (92.194 kg)  01/01/15 196 lb 4.8 oz (89.041 kg)  09/20/14 191 lb 4 oz (86.75 kg)     Lab Results  Component Value Date   WBC 7.2 09/20/2014   HGB 14.3 09/20/2014   HCT 42.1 09/20/2014   PLT 253.0 09/20/2014   GLUCOSE 93 09/20/2014   CHOL 138 09/20/2014   TRIG 147.0 09/20/2014   HDL 35.80* 09/20/2014   LDLDIRECT 149.4 09/13/2007   LDLCALC 73 09/20/2014   ALT 35 09/20/2014   AST 27 09/20/2014   NA 136 09/20/2014   K 4.4 09/20/2014   CL 102 09/20/2014   CREATININE 0.91 09/20/2014   BUN 14 09/20/2014   CO2 29 09/20/2014   TSH 2.38 09/20/2014   PSA 0.63 09/14/2013   HGBA1C 5.8 09/20/2014    Lab Results  Component Value Date   TSH 2.38 09/20/2014   Lab Results  Component Value Date   WBC 7.2 09/20/2014   HGB 14.3 09/20/2014   HCT 42.1 09/20/2014   MCV 90.9 09/20/2014   PLT 253.0 09/20/2014   Lab Results  Component Value Date   NA 136 09/20/2014   K 4.4 09/20/2014   CO2 29 09/20/2014   GLUCOSE 93 09/20/2014   BUN 14 09/20/2014   CREATININE 0.91 09/20/2014   BILITOT 0.7 09/20/2014   ALKPHOS 92 09/20/2014   AST 27 09/20/2014    ALT 35 09/20/2014   PROT 7.9 09/20/2014   ALBUMIN 4.7 09/20/2014   CALCIUM 9.8 09/20/2014   ANIONGAP 8 03/11/2014   GFR 92.15 09/20/2014   Lab Results  Component Value Date   CHOL 138 09/20/2014   Lab Results  Component Value Date   HDL 35.80* 09/20/2014   Lab Results  Component Value Date   LDLCALC 73 09/20/2014   Lab  Results  Component Value Date   TRIG 147.0 09/20/2014   Lab Results  Component Value Date   CHOLHDL 4 09/20/2014   Lab Results  Component Value Date   HGBA1C 5.8 09/20/2014       Assessment & Plan:   Problem List Items Addressed This Visit    Tobacco abuse    Has not smoked in 4 months, is encouraged to continue his efforts.       Onychomycosis    Using Penlac, may continue try soaking each night in distilled white vinegar.       Relevant Medications   ciclopirox (PENLAC) 8 % solution   Insomnia    Encouraged good sleep hygiene such as dark, quiet room. No blue/green glowing lights such as computer screens in bedroom. No alcohol or stimulants in evening. Cut down on caffeine as able. Regular exercise is helpful but not just prior to bed time. Refill on Zolpidem to use prn      Hyperlipidemia, mixed    Tolerating statin, encouraged heart healthy diet, avoid trans fats, minimize simple carbs and saturated fats. Increase exercise as tolerated      Hyperglycemia   Relevant Orders   Lipid panel   Comprehensive metabolic panel   CBC with Differential/Platelet   TSH   Hemoglobin A1c   Essential hypertension    Well controlled, no changes to meds. Encouraged heart healthy diet such as the DASH diet and exercise as tolerated.       Relevant Orders   Lipid panel   Comprehensive metabolic panel   CBC with Differential/Platelet   TSH   Hemoglobin A1c    Other Visit Diagnoses    Hyperlipidemia    -  Primary    Relevant Orders    Lipid panel    Comprehensive metabolic panel    CBC with Differential/Platelet    TSH    Hemoglobin A1c         I am having Mr. Roup maintain his aspirin, KRILL OIL PO, PROBIOTIC PRODUCT PO, amLODipine, atorvastatin, buPROPion, zolpidem, and ciclopirox.  Meds ordered this encounter  Medications  . zolpidem (AMBIEN) 10 MG tablet    Sig: Take 1 tablet (10 mg total) by mouth at bedtime as needed. for sleep    Dispense:  30 tablet    Refill:  5    Patient needs appointment for further refills  . ciclopirox (PENLAC) 8 % solution    Sig: Apply topically at bedtime. Apply over nail and surrounding skin. Apply daily over previous coat. After seven (7) days, may remove with alcohol and continue cycle.    Dispense:  6.6 mL    Refill:  3     Penni Homans, MD

## 2015-07-01 NOTE — Assessment & Plan Note (Signed)
Has not smoked in 4 months, is encouraged to continue his efforts.

## 2015-07-01 NOTE — Assessment & Plan Note (Signed)
Tolerating statin, encouraged heart healthy diet, avoid trans fats, minimize simple carbs and saturated fats. Increase exercise as tolerated 

## 2015-07-01 NOTE — Assessment & Plan Note (Signed)
Well controlled, no changes to meds. Encouraged heart healthy diet such as the DASH diet and exercise as tolerated.  °

## 2015-07-01 NOTE — Assessment & Plan Note (Signed)
Encouraged good sleep hygiene such as dark, quiet room. No blue/green glowing lights such as computer screens in bedroom. No alcohol or stimulants in evening. Cut down on caffeine as able. Regular exercise is helpful but not just prior to bed time. Refill on Zolpidem to use prn

## 2015-07-01 NOTE — Assessment & Plan Note (Signed)
Using Penlac, may continue try soaking each night in distilled white vinegar.

## 2015-07-16 ENCOUNTER — Encounter: Payer: Self-pay | Admitting: Family Medicine

## 2015-07-18 ENCOUNTER — Encounter: Payer: Self-pay | Admitting: Family Medicine

## 2015-07-18 NOTE — Telephone Encounter (Signed)
Ashlee-- please see pt's last mychart message and advise if you would like to call him re: his complaint?

## 2015-07-18 NOTE — Telephone Encounter (Signed)
See 07/16/15 patient email.

## 2015-07-18 NOTE — Telephone Encounter (Signed)
Dr Carollee Herter-- I went ahead and scheduled pt the 6 week follow up with Dr Charlett Blake. Please advise prednisone request?

## 2015-07-18 NOTE — Telephone Encounter (Signed)
Pt would need to seen to get prednisone

## 2015-07-25 NOTE — Telephone Encounter (Signed)
Called to follow up with patient.  He says he was upset because he was told by "the other girl" that there was no record of him ever receiving prednisone.  Pt was informed that I could not find it in his chart as well.  He says that Dr. Charlett Blake saw him one time before for a spot on his back that shows up when he has excessive exposure to sunlight.  He says Dr. Charlett Blake was the one who saw it and  knew exactly what it was and prescribed him prednisone and it went right aware.  Pt is requesting a refill, as he plans to go to the beach next week and would like to have medication on hand.  He plans to remain a patient and keep upcoming appts as scheduled.    Please advise.

## 2015-07-26 ENCOUNTER — Encounter: Payer: Self-pay | Admitting: Family Medicine

## 2015-07-31 ENCOUNTER — Telehealth: Payer: Self-pay | Admitting: Family Medicine

## 2015-07-31 NOTE — Telephone Encounter (Signed)
Patient dismissed from [facility] by Penni Homans MD , effective July 26, 2015. Dismissal letter sent out by certified / registered mail.  DAJ

## 2015-08-14 ENCOUNTER — Other Ambulatory Visit: Payer: Self-pay | Admitting: Family Medicine

## 2015-08-23 ENCOUNTER — Ambulatory Visit: Payer: Managed Care, Other (non HMO) | Admitting: Family Medicine

## 2015-08-23 ENCOUNTER — Ambulatory Visit: Payer: Managed Care, Other (non HMO) | Admitting: Physician Assistant

## 2015-08-23 DIAGNOSIS — Z0289 Encounter for other administrative examinations: Secondary | ICD-10-CM

## 2015-08-26 ENCOUNTER — Ambulatory Visit (INDEPENDENT_AMBULATORY_CARE_PROVIDER_SITE_OTHER): Payer: Managed Care, Other (non HMO) | Admitting: Medical

## 2015-08-26 ENCOUNTER — Encounter: Payer: Self-pay | Admitting: Medical

## 2015-08-26 ENCOUNTER — Telehealth: Payer: Self-pay | Admitting: Family Medicine

## 2015-08-26 VITALS — BP 124/82 | HR 71 | Temp 98.2°F | Resp 16 | Ht 72.0 in | Wt 204.4 lb

## 2015-08-26 DIAGNOSIS — W57XXXA Bitten or stung by nonvenomous insect and other nonvenomous arthropods, initial encounter: Secondary | ICD-10-CM | POA: Diagnosis not present

## 2015-08-26 DIAGNOSIS — T148 Other injury of unspecified body region: Secondary | ICD-10-CM

## 2015-08-26 NOTE — Patient Instructions (Addendum)
The area on your abdomen looked infected.( by the  pictures you showed me). But now looks much better after  antibiotic. I will get tick bite studies today and get results from [facility]. Then will go ahead and refer you to infectious disease since your wife has a lot of concerns regarding tick born diseases. But currently antibiotic are not indicated.  We will call you on lab results when they are in.

## 2015-08-26 NOTE — Progress Notes (Addendum)
Subjective:    Patient ID: Dakota [name], male    DOB: 11-13-1960, 55 y.o.   MRN: [id number]  HPI  Pt in for follow up in history of tick bite. He states went to [facility]. Pt had to pull off a tick. Pt states within 3 days his left lower abdomen got red, swollen and scab was over site where tick was pulled off. He shows me pictures of this area and it was quite impressive. He took antibiotic and area resolved quickly. Pt states he had some blood work done by [facility].  Pt reports no fever, no chills, no body aches. No arthrlagias, no fatigue, and no palpiations.    Review of Systems  Constitutional: Negative for chills, fatigue and fever.  Respiratory: Negative for cough, chest tightness, shortness of breath and wheezing.   Cardiovascular: Negative for chest pain and palpitations.  Gastrointestinal: Negative for abdominal pain.  Skin:       Left lower abdomen tick bite hx. Now area looks good per pt.   Neurological: Negative for dizziness and headaches.  Hematological: Negative for adenopathy. Does not bruise/bleed easily.  Psychiatric/Behavioral: Negative for behavioral problems.    Past Medical History:  Diagnosis Date  . Allergy    cats  . Anxiety   . Asthma    childhood  . CAD (coronary artery disease)   . Chicken pox as a child  . Depression   . Depression 12/25/2012  . Hyperlipidemia   . Hypertension   . Insomnia 06/11/2013  . 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 Kidney stone on left side 09/19/2013  . Left knee pain 12/25/2012  . Mumps as a child  . Onychomycosis 09/30/2014  . Preventative health care 09/24/2013     Social History   Social History  . Marital status: Legally Separated    Spouse name: N/A  . Number of children: N/A  . Years of education: N/A   Occupational History  . Not on file.   Social History Main Topics  . Smoking status: Current Every Day Smoker    Packs/day: 0.50    Years: 20.00    Types: Cigarettes  .  Smokeless tobacco: Never Used  . Alcohol use Yes     Comment: 5 or 6 drinks a week   . Drug use: No  . 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 Not on file   Social History Narrative  . No narrative on file    Past Surgical History:  Procedure Laterality Date  . CORONARY STENT PLACEMENT  01-14-05  . LITHOTRIPSY  2/16    Family History  Problem Relation Age of Onset  . Multiple sclerosis Mother   . Cancer Father     esophagus/ brain  . Alcohol abuse Father     smoker  . Seizures Sister   . COPD Sister   . Alcohol abuse Sister     addiction, recovered  . Heart disease Maternal Grandfather     MI    Allergies  Allergen Reactions  . Neosporin [Neomycin-Bacitracin Zn-Polymyx] Hives and Itching  . Polysporin [Bacitracin-Polymyxin B] Hives and Itching    Current Outpatient Prescriptions on File Prior to Visit  Medication Sig Dispense Refill  . amLODipine (NORVASC) 5 MG tablet TAKE 1 TABLET DAILY 90 tablet 2  . aspirin 81 MG tablet  Take 81 mg by mouth daily. Reported on 06/25/2015    . atorvastatin (LIPITOR) 80 MG tablet TAKE 1 TABLET DAILY 90 tablet 2  . buPROPion (WELLBUTRIN XL) 300 MG 24 hr tablet TAKE 1 TABLET DAILY 90 tablet 1  . ciclopirox (PENLAC) 8 % solution Apply topically at bedtime. Apply over nail and surrounding skin. Apply daily over previous coat. After seven (7) days, may remove with alcohol and continue cycle. 6.6 mL 3  . KRILL OIL PO Take 1 capsule by mouth daily.    Marland Kitchen PROBIOTIC PRODUCT PO Take 1 capsule by mouth daily. *Digestive Probiotic*    . zolpidem (AMBIEN) 10 MG tablet Take 1 tablet (10 mg total) by mouth at bedtime as needed. for sleep 30 tablet 5   No current facility-administered medications on file prior to visit.     BP 124/82 (BP Location: Left Arm, Patient Position: Sitting, Cuff Size: Normal)   Pulse 71   Temp 98.2 F (36.8 C) (Oral)    Resp 16   Ht 6' (1.829 m)   Wt 204 lb 6.4 oz (92.7 kg)   SpO2 96%   BMI 27.72 kg/m      Objective:   Physical Exam  General- No acute distress. Pleasant patient.  Lungs- Clear, even and unlabored. Heart- regular rate and rhythm. Neurologic- CNII- XII grossly intact.  Abdomen- soft, nt, nd, +bs, no rebound or guarding.   Skin- left lower quadrant. Small scab present and some post inflammatory hyperpigmentation. But no indication of current infection.       Assessment & Plan:  The area on your abdomen looked infected.( by the pictures you showd me). But now looks much better after  antibiotic. I will get tick bite studies today and get results from [facility]. Then will go ahead and refer you to infectious disease since your wife has a lot of concerns regarding tick born diseases. But currently antibiotics are not indicated.  Asked pt to sign release form so we can get records from [facility].  We will call you on lab results when they are in.    Eniyah Eastmond, Percell Miller, PA-C

## 2015-08-26 NOTE — Telephone Encounter (Signed)
Faxed over medical records request to [facility] for records.

## 2015-08-27 LAB — LYME AB/WESTERN BLOT REFLEX

## 2015-08-28 LAB — ROCKY MTN SPOTTED FVR ABS PNL(IGG+IGM)
RMSF IgG: NOT DETECTED
RMSF IgM: NOT DETECTED

## 2015-08-28 NOTE — Addendum Note (Signed)
Addended by: Anabel Halon on: 08/28/2015 06:36 PM   Modules accepted: Orders

## 2015-08-30 LAB — LYME ABY, WSTRN BLT IGG & IGM W/BANDS
B BURGDORFERI IGG ABS (IB): NEGATIVE
B BURGDORFERI IGM ABS (IB): NEGATIVE
LYME DISEASE 23 KD IGM: NONREACTIVE
LYME DISEASE 30 KD IGG: NONREACTIVE
LYME DISEASE 39 KD IGG: NONREACTIVE
LYME DISEASE 41 KD IGM: NONREACTIVE
LYME DISEASE 45 KD IGG: NONREACTIVE
LYME DISEASE 58 KD IGG: NONREACTIVE
LYME DISEASE 93 KD IGG: NONREACTIVE
Lyme Disease 18 kD IgG: NONREACTIVE
Lyme Disease 23 kD IgG: NONREACTIVE
Lyme Disease 28 kD IgG: NONREACTIVE
Lyme Disease 39 kD IgM: NONREACTIVE
Lyme Disease 41 kD IgG: NONREACTIVE
Lyme Disease 66 kD IgG: NONREACTIVE

## 2015-09-18 ENCOUNTER — Encounter: Payer: Self-pay | Admitting: Medical

## 2015-10-15 ENCOUNTER — Ambulatory Visit (INDEPENDENT_AMBULATORY_CARE_PROVIDER_SITE_OTHER): Payer: Managed Care, Other (non HMO) | Admitting: Internal Medicine

## 2015-10-15 ENCOUNTER — Encounter: Payer: Self-pay | Admitting: Internal Medicine

## 2015-10-15 DIAGNOSIS — L0390 Cellulitis, unspecified: Secondary | ICD-10-CM | POA: Insufficient documentation

## 2015-10-15 DIAGNOSIS — L03311 Cellulitis of abdominal wall: Secondary | ICD-10-CM

## 2015-10-15 NOTE — Progress Notes (Signed)
[facility]      Reason for Consult: recent tick bite    Referring Physician:  E Saguier PA    Patient ID: Dakota [name], male    DOB: 07/25/1960, 55 y.o.   MRN: [id number]  HPI:   He recently was bit by a tick in his backyard.  Subsequently he developed a large red rash surrounding the tick bite.  This was described as warm, erythematous, confluent rash.  He went to an urgent care and given antibiotics for cellulitis.  No fever, no chills.  Lives in [place] and no recent travel to [place] area.  No headache, no rashes on palms or soles noted.  His wife knows someone in [place] with 'chronic Lyme disease' and concerned with Lyme disease.  Titers sent by PCP and negative as well as negative for RMSF.   Previous record reviewed including blood tests.    Past Medical History:  Diagnosis Date  . Allergy    cats  . Anxiety   . Asthma    childhood  . CAD (coronary artery disease)   . Chicken pox as a child  . Depression   . Depression 12/25/2012  . Hyperlipidemia   . Hypertension   . Insomnia 06/11/2013  . 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 Kidney stone on left side 09/19/2013  . Left knee pain 12/25/2012  . Mumps as a child  . Onychomycosis 09/30/2014  . Preventative health care 09/24/2013    Prior to Admission medications   Medication Sig Start Date End Date Taking? Authorizing Provider  amLODipine (NORVASC) 5 MG tablet TAKE 1 TABLET DAILY 03/01/15  Yes Lelon Perla, MD  aspirin 81 MG tablet Take 81 mg by mouth daily. Reported on 06/25/2015   Yes Historical Provider, MD  atorvastatin (LIPITOR) 80 MG tablet TAKE 1 TABLET DAILY 04/01/15  Yes Lelon Perla, MD  buPROPion (WELLBUTRIN XL) 300 MG 24 hr tablet TAKE 1 TABLET DAILY 08/14/15  Yes [name], MD  ciclopirox (PENLAC) 8 % solution Apply topically at bedtime. Apply over nail and surrounding skin. Apply daily over previous coat. After seven (7) days, may remove with alcohol and continue  cycle. 06/25/15  Yes [name], MD  KRILL OIL PO Take 1 capsule by mouth daily.   Yes Historical Provider, MD  PROBIOTIC PRODUCT PO Take 1 capsule by mouth daily. *Digestive Probiotic*   Yes Historical Provider, MD  zolpidem (AMBIEN) 10 MG tablet Take 1 tablet (10 mg total) by mouth at bedtime as needed. for sleep 06/25/15  Yes [name], MD    Allergies  Allergen Reactions  . Neosporin [Neomycin-Bacitracin Zn-Polymyx] Hives and Itching  . Polysporin [Bacitracin-Polymyxin B] Hives and Itching    Social History  Substance Use Topics  . Smoking status: Former Smoker    Packs/day: 0.50    Years: 20.00    Types: Cigarettes    Quit date: 02/20/2015  . Smokeless tobacco: Never Used  . Alcohol use Yes     Comment: 10 drinks per week    Family History  Problem Relation Age of Onset  . Multiple sclerosis Mother   . Cancer Father     esophagus/ brain  . Alcohol abuse Father     smoker  . Seizures Sister   . COPD Sister   . Alcohol abuse Sister     addiction, recovered  . Heart disease Maternal Grandfather     MI  Review of Systems  Constitutional: negative for fevers, chills and malaise Cardiovascular: negative for chest pressure/discomfort Gastrointestinal: negative for diarrhea Musculoskeletal: negative for myalgias and arthralgias Neurological: negative for headaches and paresthesia All other systems reviewed and are negative   Constitutional: in no apparent distress and alert  Vitals:   10/15/15 0902  BP: 120/84  Pulse: 83  Temp: 97.8 F (36.6 C)   EYES: anicteric ENMT: no thrush Cardiovascular: Cor RRR Respiratory: CTA B; normal respiratory effort GI: soft Musculoskeletal: no pedal edema noted Skin: negatives: no rash, small hyperpigmented scar at site of tick bite Hematologic: no cervical lad  Labs: Lab Results  Component Value Date   WBC 7.2 09/20/2014   HGB 14.3 09/20/2014   HCT 42.1 09/20/2014   MCV 90.9 09/20/2014   PLT 253.0 09/20/2014      Lab Results  Component Value Date   CREATININE 0.91 09/20/2014   BUN 14 09/20/2014   NA 136 09/20/2014   K 4.4 09/20/2014   CL 102 09/20/2014   CO2 29 09/20/2014    Lab Results  Component Value Date   ALT 35 09/20/2014   AST 27 09/20/2014   ALKPHOS 92 09/20/2014   BILITOT 0.7 09/20/2014     Assessment: cellulitis, resolved.  I discussed with the patient the natural history of tick related infections including Lyme disease and that there is no Lyme documented in [place] and occurs sporadically in [place].  I discussed symptoms including erythema migrans rash, facial palsy, arthritis typically in knee.  I also discussed RMSF and association with fever and headache typically with spots and other tick illnesses such as ehrlichiosis.  History and rash appears most consistent with secondary cellulitis vs allergic reaction.  I also explained that testing with blood tests and titers is only indicated with the appropriate clinical syndrome and support a diagnosis and are not helpful, positive or negative, without a high pretest probability and should only be done in very limited settings with a typical clinical syndrome due to the high false positive rate.    Plan: 1) consider permetherin on clothes when in tick area  Thanks for referral

## 2015-11-26 ENCOUNTER — Other Ambulatory Visit: Payer: Self-pay | Admitting: Cardiology

## 2015-11-26 NOTE — Telephone Encounter (Signed)
Rx(s) sent to pharmacy electronically.  

## 2015-12-09 ENCOUNTER — Other Ambulatory Visit: Payer: Self-pay | Admitting: Family Medicine

## 2015-12-16 ENCOUNTER — Ambulatory Visit (INDEPENDENT_AMBULATORY_CARE_PROVIDER_SITE_OTHER): Payer: Managed Care, Other (non HMO) | Admitting: Family Medicine

## 2015-12-16 ENCOUNTER — Encounter: Payer: Self-pay | Admitting: Family Medicine

## 2015-12-16 ENCOUNTER — Encounter: Payer: Self-pay | Admitting: Gastroenterology

## 2015-12-16 DIAGNOSIS — E663 Overweight: Secondary | ICD-10-CM | POA: Diagnosis not present

## 2015-12-16 DIAGNOSIS — E782 Mixed hyperlipidemia: Secondary | ICD-10-CM

## 2015-12-16 DIAGNOSIS — I10 Essential (primary) hypertension: Secondary | ICD-10-CM | POA: Diagnosis not present

## 2015-12-16 DIAGNOSIS — Z720 Tobacco use: Secondary | ICD-10-CM | POA: Diagnosis not present

## 2015-12-16 DIAGNOSIS — Z23 Encounter for immunization: Secondary | ICD-10-CM | POA: Diagnosis not present

## 2015-12-16 DIAGNOSIS — R1310 Dysphagia, unspecified: Secondary | ICD-10-CM

## 2015-12-16 DIAGNOSIS — R739 Hyperglycemia, unspecified: Secondary | ICD-10-CM

## 2015-12-16 HISTORY — DX: Overweight: E66.3

## 2015-12-16 HISTORY — DX: Dysphagia, unspecified: R13.10

## 2015-12-16 MED ORDER — ZOLPIDEM TARTRATE 10 MG PO TABS: 10.0000 mg | ORAL_TABLET | Freq: Every evening | ORAL | 5 refills | Status: DC | PRN

## 2015-12-16 NOTE — Patient Instructions (Addendum)
Hep C antibody screen per the CDC guidelines for preventative purposes.   Cholesterol Cholesterol is a white, waxy, fat-like substance needed by your body in small amounts. The liver makes all the cholesterol you need. Cholesterol is carried from the liver by the blood through the blood vessels. Deposits of cholesterol (plaque) may build up on blood vessel walls. These make the arteries narrower and stiffer. Cholesterol plaques increase the risk for heart attack and stroke.  You cannot feel your cholesterol level even if it is very high. The only way to know it is high is with a blood test. Once you know your cholesterol levels, you should keep a record of the test results. Work with your health care provider to keep your levels in the desired range.  WHAT DO THE RESULTS MEAN?  Total cholesterol is a rough measure of all the cholesterol in your blood.   LDL is the so-called bad cholesterol. This is the type that deposits cholesterol in the walls of the arteries. You want this level to be low.   HDL is the good cholesterol because it cleans the arteries and carries the LDL away. You want this level to be high.  Triglycerides are fat that the body can either burn for energy or store. High levels are closely linked to heart disease.  WHAT ARE THE DESIRED LEVELS OF CHOLESTEROL?  Total cholesterol below 200.   LDL below 100 for people at risk, below 70 for those at very high risk.   HDL above 50 is good, above 60 is best.   Triglycerides below 150.  HOW CAN I LOWER MY CHOLESTEROL?  Diet. Follow your diet programs as directed by your health care provider.   Choose fish or white meat chicken and [place], roasted or baked. Limit fatty cuts of red meat, fried foods, and processed meats, such as sausage and lunch meats.   Eat lots of fresh fruits and vegetables.  Choose whole grains, beans, pasta, potatoes, and cereals.   Use only small amounts of olive, corn, or canola oils.    Avoid butter, mayonnaise, shortening, or palm kernel oils.  Avoid foods with trans fats.   Drink skim or nonfat milk and eat low-fat or nonfat yogurt and cheeses. Avoid whole milk, cream, ice cream, egg yolks, and full-fat cheeses.   Healthy desserts include angel food cake, ginger snaps, animal crackers, hard candy, popsicles, and low-fat or nonfat frozen yogurt. Avoid pastries, cakes, pies, and cookies.   Exercise. Follow your exercise programs as directed by your health care provider.   A regular program helps decrease LDL and raise HDL.   A regular program helps with weight control.   Do things that increase your activity level like gardening, walking, or taking the stairs. Ask your health care provider about how you can be more active in your daily life.   Medicine. Take medicine only as directed by your health care provider.   Medicine may be prescribed by your health care provider to help lower cholesterol and decrease the risk for heart disease.   If you have several risk factors, you may need medicine even if your levels are normal.   This information is not intended to replace advice given to you by your health care provider. Make sure you discuss any questions you have with your health care provider.   Document Released: 10/14/2000 Document Revised: 02/09/2014 Document Reviewed: 11/02/2012 Elsevier Interactive Patient Education [employer].

## 2015-12-16 NOTE — Assessment & Plan Note (Signed)
No cigarettes since January 2017, encouraged to continue

## 2015-12-16 NOTE — Assessment & Plan Note (Signed)
Struggles with choking on meats happens roughly every 3 months or so. No trouble with liquids. Will refer to gastroenterology for further evaluation

## 2015-12-16 NOTE — Progress Notes (Signed)
Pre visit review using our clinic review tool, if applicable. No additional management support is needed unless otherwise documented below in the visit note. 

## 2015-12-16 NOTE — Progress Notes (Signed)
Patient ID: Dakota [name], male   DOB: 1960-10-12, 55 y.o.   MRN: [id number]   Subjective:    Patient ID: Dakota [name], male    DOB: April 24, 1960, 55 y.o.   MRN: [id number]  Chief Complaint  Patient presents with  . Follow-up    HPI Patient is in today for follow up. He feels well today. No recent illness or hospitalization. He has not had a cigarette since January 2017. He continues to have some SOB with exertion but it is greatly improved with quitting smoking. Denies CP/palp/HA/congestion/fevers/GI or GU c/o. Taking meds as prescribed  Past Medical History:  Diagnosis Date  . Allergy    cats  . Anxiety   . Asthma    childhood  . CAD (coronary artery disease)   . Chicken pox as a child  . Depression   . Depression 12/25/2012  . Dysphagia 12/16/2015  . Hyperlipidemia   . Hypertension   . Insomnia 06/11/2013  . 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 Kidney stone on left side 09/19/2013  . Left knee pain 12/25/2012  . Mumps as a child  . Onychomycosis 09/30/2014  . Overweight 12/16/2015  . Preventative health care 09/24/2013    Past Surgical History:  Procedure Laterality Date  . CORONARY STENT PLACEMENT  01-14-05  . LITHOTRIPSY  2/16    Family History  Problem Relation Age of Onset  . Multiple sclerosis Mother   . Cancer Father     esophagus/ brain  . Alcohol abuse Father     smoker  . Seizures Sister   . COPD Sister   . Alcohol abuse Sister     addiction, recovered  . Heart disease Maternal Grandfather     MI    Social History   Social History  . Marital status: Legally Separated    Spouse name: N/A  . Number of children: N/A  . Years of education: N/A   Occupational History  . Not on file.   Social History Main Topics  . Smoking status: Former Smoker    Packs/day: 0.50    Years: 20.00    Types: Cigarettes    Quit date: 02/20/2015  . Smokeless tobacco: Never Used  . Alcohol use Yes     Comment: 10 drinks per week  . Drug use: No  .  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 Not on file   Social History Narrative  . No narrative on file    Outpatient Medications Prior to Visit  Medication Sig Dispense Refill  . amLODipine (NORVASC) 5 MG tablet TAKE 1 TABLET DAILY 90 tablet 2  . aspirin 81 MG tablet Take 81 mg by mouth daily. Reported on 06/25/2015    . atorvastatin (LIPITOR) 80 MG tablet TAKE 1 TABLET DAILY 90 tablet 2  . buPROPion (WELLBUTRIN XL) 300 MG 24 hr tablet TAKE 1 TABLET DAILY 90 tablet 1  . ciclopirox (PENLAC) 8 % solution Apply topically at bedtime. Apply over nail and surrounding skin. Apply daily over previous coat. After seven (7) days, may remove with alcohol and continue cycle. 6.6 mL 3  . KRILL OIL PO Take 1 capsule by mouth daily.    Marland Kitchen PROBIOTIC PRODUCT PO Take 1 capsule by mouth daily. *Digestive Probiotic*    . zolpidem (AMBIEN) 10 MG tablet Take 1 tablet (10 mg total) by mouth at  bedtime as needed. for sleep 30 tablet 5   No facility-administered medications prior to visit.     Allergies  Allergen Reactions  . Neosporin [Neomycin-Bacitracin Zn-Polymyx] Hives and Itching  . Polysporin [Bacitracin-Polymyxin B] Hives and Itching    Review of Systems  Constitutional: Negative for chills, fever and malaise/fatigue.  HENT: Negative for congestion and hearing loss.   Eyes: Negative for discharge.  Respiratory: Positive for shortness of breath. Negative for cough and sputum production.   Cardiovascular: Negative for chest pain, palpitations and leg swelling.  Gastrointestinal: Positive for heartburn. Negative for abdominal pain, blood in stool, constipation, diarrhea, nausea and vomiting.  Genitourinary: Negative for dysuria, frequency, hematuria and urgency.  Musculoskeletal: Negative for back pain, falls and myalgias.  Skin: Negative for rash.  Neurological: Negative for dizziness, sensory  change, loss of consciousness, weakness and headaches.  Endo/Heme/Allergies: Negative for environmental allergies. Does not bruise/bleed easily.  Psychiatric/Behavioral: Negative for depression and suicidal ideas. The patient is not nervous/anxious and does not have insomnia.        Objective:    Physical Exam  Constitutional: He is oriented to person, place, and time. He appears well-developed and well-nourished. No distress.  HENT:  Head: Normocephalic and atraumatic.  Eyes: Conjunctivae are normal.  Neck: Neck supple. No thyromegaly present.  Cardiovascular: Normal rate, regular rhythm and normal heart sounds.   No murmur heard. Pulmonary/Chest: Effort normal and breath sounds normal. No respiratory distress. He has no wheezes.  Abdominal: Soft. Bowel sounds are normal. He exhibits no mass. There is no tenderness.  Musculoskeletal: He exhibits no edema.  Lymphadenopathy:    He has no cervical adenopathy.  Neurological: He is alert and oriented to person, place, and time.  Skin: Skin is warm and dry.  Psychiatric: He has a normal mood and affect. His behavior is normal.    BP 135/83 (BP Location: Left Arm, Patient Position: Sitting, Cuff Size: Normal)   Pulse 82   Temp 97.8 F (36.6 C) (Oral)   Ht 6' (1.829 m)   Wt 208 lb 4 oz (94.5 kg)   SpO2 97%   BMI 28.24 kg/m  Wt Readings from Last 3 Encounters:  12/23/15 208 lb (94.3 kg)  12/16/15 208 lb 4 oz (94.5 kg)  10/15/15 211 lb (95.7 kg)     Lab Results  Component Value Date   WBC 7.2 09/20/2014   HGB 14.3 09/20/2014   HCT 42.1 09/20/2014   PLT 253.0 09/20/2014   GLUCOSE 93 09/20/2014   CHOL 138 09/20/2014   TRIG 147.0 09/20/2014   HDL 35.80 (L) 09/20/2014   LDLDIRECT 149.4 09/13/2007   LDLCALC 73 09/20/2014   ALT 35 09/20/2014   AST 27 09/20/2014   NA 136 09/20/2014   K 4.4 09/20/2014   CL 102 09/20/2014   CREATININE 0.91 09/20/2014   BUN 14 09/20/2014   CO2 29 09/20/2014   TSH 2.38 09/20/2014   PSA 0.63  09/14/2013   HGBA1C 5.8 09/20/2014    Lab Results  Component Value Date   TSH 2.38 09/20/2014   Lab Results  Component Value Date   WBC 7.2 09/20/2014   HGB 14.3 09/20/2014   HCT 42.1 09/20/2014   MCV 90.9 09/20/2014   PLT 253.0 09/20/2014   Lab Results  Component Value Date   NA 136 09/20/2014   K 4.4 09/20/2014   CO2 29 09/20/2014   GLUCOSE 93 09/20/2014   BUN 14 09/20/2014   CREATININE 0.91 09/20/2014   BILITOT 0.7 09/20/2014  ALKPHOS 92 09/20/2014   AST 27 09/20/2014   ALT 35 09/20/2014   PROT 7.9 09/20/2014   ALBUMIN 4.7 09/20/2014   CALCIUM 9.8 09/20/2014   ANIONGAP 8 03/11/2014   GFR 92.15 09/20/2014   Lab Results  Component Value Date   CHOL 138 09/20/2014   Lab Results  Component Value Date   HDL 35.80 (L) 09/20/2014   Lab Results  Component Value Date   LDLCALC 73 09/20/2014   Lab Results  Component Value Date   TRIG 147.0 09/20/2014   Lab Results  Component Value Date   CHOLHDL 4 09/20/2014   Lab Results  Component Value Date   HGBA1C 5.8 09/20/2014       Assessment & Plan:   Problem List Items Addressed This Visit    Hyperlipidemia, mixed    Encouraged heart healthy diet, increase exercise, avoid trans fats, consider a krill oil cap daily      Essential hypertension    Well controlled, no changes to meds. Encouraged heart healthy diet such as the DASH diet and exercise as tolerated.       Tobacco abuse    No cigarettes since January 2017, encouraged to continue      Hyperglycemia    minimize simple carbs. Increase exercise as tolerated.       Overweight    Encouraged DASH diet, decrease po intake and increase exercise as tolerated. Needs 7-8 hours of sleep nightly. Avoid trans fats, eat small, frequent meals every 4-5 hours with lean proteins, complex carbs and healthy fats. Minimize simple carbs      Dysphagia    Struggles with choking on meats happens roughly every 3 months or so. No trouble with liquids. Will refer to  gastroenterology for further evaluation      Relevant Orders   Ambulatory referral to Gastroenterology    Other Visit Diagnoses    Encounter for immunization       Relevant Orders   Flu Vaccine QUAD 36+ mos IM (Completed)      I am having Mr. Nelan maintain his aspirin, KRILL OIL PO, PROBIOTIC PRODUCT PO, atorvastatin, ciclopirox, amLODipine, buPROPion, and zolpidem.  Meds ordered this encounter  Medications  . zolpidem (AMBIEN) 10 MG tablet    Sig: Take 1 tablet (10 mg total) by mouth at bedtime as needed. for sleep    Dispense:  30 tablet    Refill:  5    Patient needs appointment for further refills     Penni Homans, MD

## 2015-12-16 NOTE — Assessment & Plan Note (Signed)
Encouraged DASH diet, decrease po intake and increase exercise as tolerated. Needs 7-8 hours of sleep nightly. Avoid trans fats, eat small, frequent meals every 4-5 hours with lean proteins, complex carbs and healthy fats. Minimize simple carbs 

## 2015-12-16 NOTE — Assessment & Plan Note (Signed)
Well controlled, no changes to meds. Encouraged heart healthy diet such as the DASH diet and exercise as tolerated.  °

## 2015-12-23 ENCOUNTER — Encounter (HOSPITAL_BASED_OUTPATIENT_CLINIC_OR_DEPARTMENT_OTHER): Payer: Self-pay | Admitting: *Deleted

## 2015-12-23 ENCOUNTER — Emergency Department (HOSPITAL_BASED_OUTPATIENT_CLINIC_OR_DEPARTMENT_OTHER)
Admission: EM | Admit: 2015-12-23 | Discharge: 2015-12-23 | Discharge status: Against Medical Advice | Payer: Managed Care, Other (non HMO) | Attending: Emergency Medicine | Admitting: Emergency Medicine

## 2015-12-23 DIAGNOSIS — Z87891 Personal history of nicotine dependence: Secondary | ICD-10-CM | POA: Insufficient documentation

## 2015-12-23 DIAGNOSIS — Z79899 Other long term (current) drug therapy: Secondary | ICD-10-CM | POA: Insufficient documentation

## 2015-12-23 DIAGNOSIS — Y929 Unspecified place or not applicable: Secondary | ICD-10-CM | POA: Insufficient documentation

## 2015-12-23 DIAGNOSIS — I10 Essential (primary) hypertension: Secondary | ICD-10-CM | POA: Insufficient documentation

## 2015-12-23 DIAGNOSIS — I2510 Atherosclerotic heart disease of native coronary artery without angina pectoris: Secondary | ICD-10-CM | POA: Insufficient documentation

## 2015-12-23 DIAGNOSIS — Z7982 Long term (current) use of aspirin: Secondary | ICD-10-CM | POA: Diagnosis not present

## 2015-12-23 DIAGNOSIS — Y999 Unspecified external cause status: Secondary | ICD-10-CM | POA: Diagnosis not present

## 2015-12-23 DIAGNOSIS — W57XXXA Bitten or stung by nonvenomous insect and other nonvenomous arthropods, initial encounter: Secondary | ICD-10-CM | POA: Insufficient documentation

## 2015-12-23 DIAGNOSIS — Y939 Activity, unspecified: Secondary | ICD-10-CM | POA: Insufficient documentation

## 2015-12-23 DIAGNOSIS — S70262A Insect bite (nonvenomous), left hip, initial encounter: Secondary | ICD-10-CM | POA: Insufficient documentation

## 2015-12-23 DIAGNOSIS — J45909 Unspecified asthma, uncomplicated: Secondary | ICD-10-CM | POA: Diagnosis not present

## 2015-12-23 NOTE — ED Notes (Signed)
Per Delrae Rend, [place], pt walked out during exam and stated that his wife would cut the rest of the tick out.

## 2015-12-23 NOTE — ED Provider Notes (Signed)
[facility] Provider Note   CSN: [id number] Arrival date & time: 12/23/15  2023  By signing my name below, I, Ephriam Jenkins, attest that this documentation has been prepared under the direction and in the presence of [employer].  Electronically Signed: Ephriam Jenkins, ED Scribe. 12/23/15. 9:38 PM.   History   Chief Complaint Chief Complaint  Patient presents with  . Tick Removal    HPI HPI Comments: Dakota [name] is a 55 y.o. male who presents to the Emergency Department complaining of a tick bite to his left hip that was noticed two days ago. Pt states that he tried to remove the tick but thinks part of it is still embedded in his skin. He is unsure of how long the tick has been there. Pt has has vaccinations due to previous tick bites. He denies any symptoms. He is requesting we remove the rest of the tick.  The history is provided by the patient. No language interpreter was used.    Past Medical History:  Diagnosis Date  . Allergy    cats  . Anxiety   . Asthma    childhood  . CAD (coronary artery disease)   . Chicken pox as a child  . Depression   . Depression 12/25/2012  . Dysphagia 12/16/2015  . Hyperlipidemia   . Hypertension   . Insomnia 06/11/2013  . 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 Kidney stone on left side 09/19/2013  . Left knee pain 12/25/2012  . Mumps as a child  . Onychomycosis 09/30/2014  . Overweight 12/16/2015  . Preventative health care 09/24/2013    Patient Active Problem List   Diagnosis Date Noted  . Overweight 12/16/2015  . Dysphagia 12/16/2015  . Cellulitis 10/15/2015  . Hyperglycemia 09/30/2014  . Onychomycosis 09/30/2014  . Cerebrovascular disease 01/15/2014  . Preventative health care 09/24/2013  . Kidney stone on left side 09/19/2013  . Insomnia 06/11/2013  . Sprain of left knee 12/26/2012  . Depression 12/25/2012  . Left knee pain 12/25/2012  . Asthma   . Allergy   . Tobacco abuse 02/11/2011  .  Hyperlipidemia, mixed 08/27/2009  . Essential hypertension 08/27/2009  . CAD, NATIVE VESSEL 08/27/2009    Past Surgical History:  Procedure Laterality Date  . CORONARY STENT PLACEMENT  01-14-05  . LITHOTRIPSY  2/16      Home Medications    Prior to Admission medications   Medication Sig Start Date End Date Taking? Authorizing Provider  amLODipine (NORVASC) 5 MG tablet TAKE 1 TABLET DAILY 11/26/15  Yes Lelon Perla, MD  aspirin 81 MG tablet Take 81 mg by mouth daily. Reported on 06/25/2015   Yes Historical Provider, MD  atorvastatin (LIPITOR) 80 MG tablet TAKE 1 TABLET DAILY 04/01/15  Yes Lelon Perla, MD  buPROPion (WELLBUTRIN XL) 300 MG 24 hr tablet TAKE 1 TABLET DAILY 12/09/15  Yes [name], MD  ciclopirox (PENLAC) 8 % solution Apply topically at bedtime. Apply over nail and surrounding skin. Apply daily over previous coat. After seven (7) days, may remove with alcohol and continue cycle. 06/25/15  Yes [name], MD  KRILL OIL PO Take 1 capsule by mouth daily.   Yes Historical Provider, MD  PROBIOTIC PRODUCT PO Take 1 capsule by mouth daily. *Digestive Probiotic*   Yes Historical Provider, MD  zolpidem (AMBIEN) 10 MG tablet Take 1 tablet (10 mg total) by mouth at bedtime as needed. for sleep 12/16/15  Yes Erline Levine A  Charlett Blake, MD    Family History Family History  Problem Relation Age of Onset  . Multiple sclerosis Mother   . Cancer Father     esophagus/ brain  . Alcohol abuse Father     smoker  . Seizures Sister   . COPD Sister   . Alcohol abuse Sister     addiction, recovered  . Heart disease Maternal Grandfather     MI    Social History Social History  Substance Use Topics  . Smoking status: Former Smoker    Packs/day: 0.50    Years: 20.00    Types: Cigarettes    Quit date: 02/20/2015  . Smokeless tobacco: Never Used  . Alcohol use Yes     Comment: 10 drinks per week     Allergies   Neosporin [neomycin-bacitracin zn-polymyx] and Polysporin  [bacitracin-polymyxin b]   Review of Systems Review of Systems 10 Systems reviewed and all are negative for acute change except as noted in the HPI.   Physical Exam Updated Vital Signs BP 139/95   Pulse 81   Temp 97.7 F (36.5 C) (Oral)   Resp 16   Ht 6' (1.829 m)   Wt 208 lb (94.3 kg)   SpO2 98%   BMI 28.21 kg/m   Physical Exam  Constitutional: He is oriented to person, place, and time. He appears well-developed and well-nourished. No distress.  HENT:  Head: Normocephalic and atraumatic.  Neck: Normal range of motion.  Pulmonary/Chest: Effort normal.  Neurological: He is alert and oriented to person, place, and time.  Skin: Skin is warm and dry. He is not diaphoretic.  Left hip with lesion where pt picked at his skin. No insect parts visible or palpable. No bleeding or discharge. No erythema .  Psychiatric: He has a normal mood and affect. Judgment normal.  Nursing note and vitals reviewed.    ED Treatments / Results  DIAGNOSTIC STUDIES: Oxygen Saturation is 98% on RA, normal by my interpretation.   Labs (all labs ordered are listed, but only abnormal results are displayed) Labs Reviewed - No data to display  EKG  EKG Interpretation None       Radiology No results found.  Procedures Procedures (including critical care time)  Medications Ordered in ED Medications - No data to display   Initial Impression / Assessment and Plan / ED Course  I have reviewed the triage vital signs and the nursing notes.  Pertinent labs & imaging results that were available during my care of the patient were reviewed by me and considered in my medical decision making (see chart for details).  Clinical Course    I discussed with pt that since he removed most of the tick no further tx indicated. I explained his body will reject any remaining insect parts on its own and his PCP will monitor and decide on tickborne illness workup in the future. Pt left prior to receiving  discharge instructions.   Final Clinical Impressions(s) / ED Diagnoses   Final diagnoses:  Tick bite, initial encounter    New Prescriptions Discharge Medication List as of 12/23/2015 10:00 PM     I personally performed the services described in this documentation, which was scribed in my presence. The recorded information has been reviewed and is accurate.    Anne Ng, PA-C 12/24/15 0031    Charlesetta Shanks, MD 12/26/15 2352

## 2015-12-23 NOTE — ED Triage Notes (Signed)
Tick on his left hip. He tried to remove it but he thinks he left part of it in his skin.

## 2015-12-27 ENCOUNTER — Other Ambulatory Visit: Payer: Self-pay | Admitting: Cardiology

## 2015-12-30 NOTE — Telephone Encounter (Signed)
Rx(s) sent to pharmacy electronically.  

## 2015-12-30 NOTE — Assessment & Plan Note (Signed)
minimize simple carbs. Increase exercise as tolerated.  

## 2015-12-30 NOTE — Assessment & Plan Note (Signed)
Encouraged heart healthy diet, increase exercise, avoid trans fats, consider a krill oil cap daily 

## 2016-01-06 NOTE — Progress Notes (Deleted)
HPI: FU coronary artery disease. 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 Cath revealed normal LM. Left anterior descending artery had 30% lesion in the proximal and mid-vessel. Lcx with 60 % lesion. There were left-to-right collaterals to the RCA. Right coronary artery was dominant and was 100% midvessel occlusion after the takeoff of a medium-sized RV branch; inferobasal wall akinesis. EF was 50%. He underwent PCI of the right coronary artery with bare-metal stent. EF was 50%. Stress echocardiogram in February 2013 was normal. 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 Since last seen,   Current Outpatient Prescriptions  Medication Sig Dispense Refill  . amLODipine (NORVASC) 5 MG tablet TAKE 1 TABLET DAILY 90 tablet 2  . aspirin 81 MG tablet Take 81 mg by mouth daily. Reported on 06/25/2015    . atorvastatin (LIPITOR) 80 MG tablet TAKE 1 TABLET DAILY 90 tablet 0  . buPROPion (WELLBUTRIN XL) 300 MG 24 hr tablet TAKE 1 TABLET DAILY 90 tablet 1  . ciclopirox (PENLAC) 8 % solution Apply topically at bedtime. Apply over nail and surrounding skin. Apply daily over previous coat. After seven (7) days, may remove with alcohol and continue cycle. 6.6 mL 3  . KRILL OIL PO Take 1 capsule by mouth daily.    Marland Kitchen PROBIOTIC PRODUCT PO Take 1 capsule by mouth daily. *Digestive Probiotic*    . zolpidem (AMBIEN) 10 MG tablet Take 1 tablet (10 mg total) by mouth at bedtime as needed. for sleep 30 tablet 5   No current facility-administered medications for this visit.      Past Medical History:  Diagnosis Date  . Allergy    cats  . Anxiety   . Asthma    childhood  . CAD (coronary artery disease)   . Chicken pox as a child  . Depression   . Depression 12/25/2012  . Dysphagia 12/16/2015  . Hyperlipidemia   . Hypertension   . Insomnia 06/11/2013  . 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 Kidney stone on left side 09/19/2013  . Left knee  pain 12/25/2012  . Mumps as a child  . Onychomycosis 09/30/2014  . Overweight 12/16/2015  . Preventative health care 09/24/2013    Past Surgical History:  Procedure Laterality Date  . CORONARY STENT PLACEMENT  01-14-05  . LITHOTRIPSY  2/16    Social History   Social History  . Marital status: Legally Separated    Spouse name: N/A  . Number of children: N/A  . Years of education: N/A   Occupational History  . Not on file.   Social History Main Topics  . Smoking status: Former Smoker    Packs/day: 0.50    Years: 20.00    Types: Cigarettes    Quit date: 02/20/2015  . Smokeless tobacco: Never Used  . Alcohol use Yes     Comment: 10 drinks per week  . Drug use: No  . 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 Not on file   Social History Narrative  . No narrative on file    Family History  Problem Relation Age of Onset  . Multiple sclerosis Mother   . Cancer Father     esophagus/ brain  . Alcohol abuse Father     smoker  . Seizures Sister   . COPD Sister   .  Alcohol abuse Sister     addiction, recovered  . Heart disease Maternal Grandfather     MI    ROS: no fevers or chills, productive cough, hemoptysis, dysphasia, odynophagia, melena, hematochezia, dysuria, hematuria, rash, seizure activity, orthopnea, PND, pedal edema, claudication. Remaining systems are negative.  Physical Exam: Well-developed well-nourished in no acute distress.  Skin is warm and dry.  HEENT is normal.  Neck is supple.  Chest is clear to auscultation with normal expansion.  Cardiovascular exam is regular rate and rhythm.  Abdominal exam nontender or distended. No masses palpated. Extremities show no edema. neuro grossly intact  ECG

## 2016-01-09 ENCOUNTER — Ambulatory Visit: Payer: Managed Care, Other (non HMO) | Admitting: Cardiology

## 2016-01-15 ENCOUNTER — Ambulatory Visit (INDEPENDENT_AMBULATORY_CARE_PROVIDER_SITE_OTHER): Payer: Managed Care, Other (non HMO) | Admitting: Physician Assistant

## 2016-01-15 ENCOUNTER — Encounter: Payer: Self-pay | Admitting: Physician Assistant

## 2016-01-15 VITALS — BP 128/90 | HR 89 | Ht 72.0 in | Wt 208.8 lb

## 2016-01-15 DIAGNOSIS — I6523 Occlusion and stenosis of bilateral carotid arteries: Secondary | ICD-10-CM

## 2016-01-15 DIAGNOSIS — I10 Essential (primary) hypertension: Secondary | ICD-10-CM | POA: Diagnosis not present

## 2016-01-15 DIAGNOSIS — I2510 Atherosclerotic heart disease of native coronary artery without angina pectoris: Secondary | ICD-10-CM | POA: Diagnosis not present

## 2016-01-15 DIAGNOSIS — I771 Stricture of artery: Secondary | ICD-10-CM

## 2016-01-15 DIAGNOSIS — E785 Hyperlipidemia, unspecified: Secondary | ICD-10-CM

## 2016-01-15 NOTE — Progress Notes (Signed)
Cardiology Office Note    Date:  01/15/2016   ID:  Dakota [name], DOB Jun 10, 1960, MRN [id number]  PCP:  Dakota Homans, MD  Cardiologist:  Dr. Stanford [name]   Chief Complaint  Patient presents with  . Follow-up  . Shortness of Breath    some    History of Present Illness:  Dakota [name] is a 55 y.o. male with PMH of HTN, HLD, insomnia and CAD. 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 Cardiac cath showed normal left main, 30% proximal and mid LAD, 60% left circumflex, there were left-to-right collaterals to the RCA, RCA was dominant with 100% mid vessel occlusion after takeoff of a medium sized RV branch. There was also a inferobasal wall akinesis, EF 50%. He underwent PCI of RCA with bare-metal stent. Stress echocardiogram in February 2013 was normal. Carotid Doppler in January 2016 showed greater than 50% left subclavian and mild bilateral stenosis. He was last seen a year ago on 01/01/2015 by Dr. Stanford [name], he did have some dyspnea on extreme exertion, however no obvious dyspnea with regular activities. No further workup was recommended.  He has been doing quite well since the last year, quit smoking in January and has not picked up since. He has not had any chest discomfort recently. He has baseline shortness breath with exertion which is essentially unchanged compared to the last few years. He denies any lower extremity edema, orthopnea or paroxysmal nocturnal dyspnea. Overall, he has been doing quite well from cardiology standpoint.    Past Medical History:  Diagnosis Date  . Allergy    cats  . Anxiety   . Asthma    childhood  . CAD (coronary artery disease)   . Chicken pox as a child  . Depression   . Depression 12/25/2012  . Dysphagia 12/16/2015  . Hyperlipidemia   . Hypertension   . Insomnia 06/11/2013  . 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 Kidney stone on left side 09/19/2013  . Left knee pain 12/25/2012  . Mumps as a child  .  Onychomycosis 09/30/2014  . Overweight 12/16/2015  . Preventative health care 09/24/2013    Past Surgical History:  Procedure Laterality Date  . CORONARY STENT PLACEMENT  01-14-05  . LITHOTRIPSY  2/16    Current Medications: Outpatient Medications Prior to Visit  Medication Sig Dispense Refill  . amLODipine (NORVASC) 5 MG tablet TAKE 1 TABLET DAILY 90 tablet 2  . atorvastatin (LIPITOR) 80 MG tablet TAKE 1 TABLET DAILY 90 tablet 0  . buPROPion (WELLBUTRIN XL) 300 MG 24 hr tablet TAKE 1 TABLET DAILY 90 tablet 1  . KRILL OIL PO Take 1 capsule by mouth daily.    Marland Kitchen PROBIOTIC PRODUCT PO Take 1 capsule by mouth daily. *Digestive Probiotic*    . zolpidem (AMBIEN) 10 MG tablet Take 1 tablet (10 mg total) by mouth at bedtime as needed. for sleep 30 tablet 5  . aspirin 81 MG tablet Take 81 mg by mouth daily. Reported on 06/25/2015    . ciclopirox (PENLAC) 8 % solution Apply topically at bedtime. Apply over nail and surrounding skin. Apply daily over previous coat. After seven (7) days, may remove with alcohol and continue cycle. 6.6 mL 3   No facility-administered medications prior to visit.      Allergies:   Neosporin [neomycin-bacitracin zn-polymyx] and Polysporin [bacitracin-polymyxin b]   Social History   Social History  . Marital status: Legally Separated    Spouse name: N/A  .  Number of children: N/A  . Years of education: N/A   Social History Main Topics  . Smoking status: Former Smoker    Packs/day: 0.50    Years: 20.00    Types: Cigarettes    Quit date: 02/20/2015  . Smokeless tobacco: Never Used  . Alcohol use Yes     Comment: 10 drinks per week  . Drug use: No  . 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 None   Social History Narrative  . None     Family History:  The patient's family history includes Alcohol abuse in his father and sister; COPD in his sister; Cancer  in his father; Heart disease in his maternal grandfather; Multiple sclerosis in his mother; Seizures in his sister.   ROS:   Please see the history of present illness.    ROS All other systems reviewed and are negative.   PHYSICAL EXAM:   VS:  BP 128/90   Pulse 89   Ht 6' (1.829 m)   Wt 208 lb 12.8 oz (94.7 kg)   BMI 28.32 kg/m    GEN: Well nourished, well developed, in no acute distress  HEENT: normal  Neck: no JVD, carotid bruits, or masses Cardiac: RRR; no murmurs, rubs, or gallops,no edema  Respiratory:  clear to auscultation bilaterally, normal work of breathing GI: soft, nontender, nondistended, + BS MS: no deformity or atrophy  Skin: warm and dry, no rash Neuro:  Alert and Oriented x 3, Strength and sensation are intact Psych: euthymic mood, full affect  Wt Readings from Last 3 Encounters:  01/15/16 208 lb 12.8 oz (94.7 kg)  12/23/15 208 lb (94.3 kg)  12/16/15 208 lb 4 oz (94.5 kg)      Studies/Labs Reviewed:   EKG:  EKG is ordered today.  The ekg ordered today demonstrates Normal sinus rhythm, chronic T-wave inversion in lead 3 and aVF unchanged compared to the previous EKG.  Recent Labs: No results found for requested labs within last 8760 hours.   Lipid Panel    Component Value Date/Time   CHOL 138 09/20/2014 1004   TRIG 147.0 09/20/2014 1004   HDL 35.80 (L) 09/20/2014 1004   CHOLHDL 4 09/20/2014 1004   VLDL 29.4 09/20/2014 1004   LDLCALC 73 09/20/2014 1004   LDLDIRECT 149.4 09/13/2007 0936    Additional studies/ records that were reviewed today include:   Stress echo 03/09/2011 - Stress ECG conclusions: There were no stress arrhythmias or conduction abnormalities. The stress ECG was normal. - Staged echo: There was no echocardiographic evidence for stress-induced ischemia. Impressions:  - Technically difficult study; no chest pain and no ST changes; no stress-induced wall motion abnormalities; normal stress echocardiogram.    Carotid  US 02/05/2014     ASSESSMENT:    1. Coronary artery disease involving native coronary artery of native heart without angina pectoris   2. Essential hypertension   3. Hyperlipidemia, unspecified hyperlipidemia type   4. Atherosclerosis of both carotid arteries   5. Stenosis of left subclavian artery (HCC)      PLAN:  In order of problems listed above:  1. CAD: He denies any obvious angina. He has chronic dyspnea on exertion, the degree has not changed in the last several years. He attributed to his smoking history. Fortunately, he has quit smoking since January of this year and has not picked up smoking  again.  2. HTN: Well-controlled on current medication. For some reason it does not appear he was ever placed on beta blocker.  3. HLD: On Lipitor 80 mg daily. He says he has upcoming lab run by his company, I have advised him to obtain a fasting lipid panel and LFT and have results forwarded to [place].  4. Mild carotid stenosis with >50% L subclavian stenosis: will arrange carotid doppler in Jan 2018    Medication Adjustments/Labs and Tests Ordered: Current medicines are reviewed at length with the patient today.  Concerns regarding medicines are outlined above.  Medication changes, Labs and Tests ordered today are listed in the Patient Instructions below. Patient Instructions  Medication Instructions:  Your physician recommends that you continue on your current medications as directed. Please refer to the Current Medication list given to you today.  Labwork: Our recommendations for labs are LFT and Lipids for upcoming lab work with his place of employment; Fax a copy of labs to our office [phone]  Testing/Procedures: None   Follow-Up: Your physician wants you to follow-up in: [place]. You will receive a reminder letter in the mail two months in advance. If you don't receive a letter, please call our office to schedule the follow-up appointment.  Any Other  Special Instructions Will Be Listed Below (If Applicable).     If you need a refill on your cardiac medications before your next appointment, please call your pharmacy.     Hilbert Corrigan, [place]  01/15/2016 8:14 PM    [facility] [address], [place], [place]  [address] Phone: [phone]; Fax: [phone]

## 2016-01-15 NOTE — Patient Instructions (Signed)
Medication Instructions:  Your physician recommends that you continue on your current medications as directed. Please refer to the Current Medication list given to you today.  Labwork: Our recommendations for labs are LFT and Lipids for upcoming lab work with his place of employment; Fax a copy of labs to our office [phone]  Testing/Procedures: None   Follow-Up: Your physician wants you to follow-up in: [place]. You will receive a reminder letter in the mail two months in advance. If you don't receive a letter, please call our office to schedule the follow-up appointment.  Any Other Special Instructions Will Be Listed Below (If Applicable).     If you need a refill on your cardiac medications before your next appointment, please call your pharmacy.

## 2016-01-17 ENCOUNTER — Other Ambulatory Visit: Payer: Self-pay | Admitting: *Deleted

## 2016-01-17 DIAGNOSIS — I679 Cerebrovascular disease, unspecified: Secondary | ICD-10-CM

## 2016-01-17 DIAGNOSIS — I6529 Occlusion and stenosis of unspecified carotid artery: Secondary | ICD-10-CM

## 2016-02-06 ENCOUNTER — Encounter: Payer: Self-pay | Admitting: Gastroenterology

## 2016-02-06 ENCOUNTER — Ambulatory Visit (INDEPENDENT_AMBULATORY_CARE_PROVIDER_SITE_OTHER): Payer: Managed Care, Other (non HMO) | Admitting: Gastroenterology

## 2016-02-06 VITALS — BP 128/84 | HR 80 | Ht 71.0 in | Wt 208.0 lb

## 2016-02-06 DIAGNOSIS — R1310 Dysphagia, unspecified: Secondary | ICD-10-CM | POA: Diagnosis not present

## 2016-02-06 NOTE — Patient Instructions (Signed)
If you are age 55 or older, your body mass index should be between 23-30. Your Body mass index is 29.01 kg/m. If this is out of the aforementioned range listed, please consider follow up with your Primary Care Provider.  If you are age 34 or younger, your body mass index should be between 19-25. Your Body mass index is 29.01 kg/m. If this is out of the aformentioned range listed, please consider follow up with your Primary Care Provider.   You have been scheduled for an endoscopy. Please follow written instructions given to you at your visit today. If you use inhalers (even only as needed), please bring them with you on the day of your procedure. Your physician has requested that you go to www.startemmi.com and enter the access code given to you at your visit today. This web site gives a general overview about your procedure. However, you should still follow specific instructions given to you by our office regarding your preparation for the procedure.  Thank you for choosing [facility]  Dr Wilfrid Lund III

## 2016-02-06 NOTE — Progress Notes (Signed)
[facility] Consult Note:  History: Dakota [name] 02/06/2016  Referring physician: Penni Homans, MD  Reason for consult/chief complaint: Dysphagia (food getting stuck and having to force it back up)   Subjective  HPI:  This 56 year old man referred by primary care for about 2 years of intermittent solid food dysphagia. It typically occurs with meat a couple of times a month. It might fill completely stopped for a while not even liquids will pass. On occasion he has had to "force it back up". It is nonprogressive and he denies weight loss. He was particularly concerned because his father died from esophageal cancer.   Dakota [name] had a screening colonoscopy in [facility] in March 2013.  ROS:  Review of Systems  Constitutional: Negative for appetite change and unexpected weight change.  HENT: Negative for mouth sores and voice change.   Eyes: Negative for pain and redness.  Respiratory: Negative for cough and shortness of breath.   Cardiovascular: Negative for chest pain and palpitations.  Genitourinary: Negative for dysuria and hematuria.  Musculoskeletal: Negative for arthralgias and myalgias.  Skin: Negative for pallor and rash.  Neurological: Negative for weakness and headaches.  Hematological: Negative for adenopathy.     Past Medical History: Past Medical History:  Diagnosis Date  . Allergy    cats  . Anxiety   . Asthma    childhood  . CAD (coronary artery disease)   . Chicken pox as a child  . Depression 12/25/2012  . Dysphagia 12/16/2015  . Hyperlipidemia   . Hypertension   . Insomnia 06/11/2013  . 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 Kidney stone on left side 09/19/2013  . Left knee pain 12/25/2012  . Mumps as a child  . Onychomycosis 09/30/2014  . Overweight 12/16/2015  . Preventative health care 09/24/2013     Past Surgical History: Past Surgical History:  Procedure Laterality Date  . CORONARY STENT PLACEMENT  01-14-05  . LITHOTRIPSY   2/16     Family History: Family History  Problem Relation Age of Onset  . Multiple sclerosis Mother   . Cancer Father     esophagus/ brain  . Alcohol abuse Father     smoker  . Seizures Sister     AVM  . COPD Sister   . Alcohol abuse Sister     addiction, recovered  . Heart disease Maternal Grandfather     MI    Social History: Social History   Social History  . Marital status: Legally Separated    Spouse name: N/A  . Number of children: 1  . Years of education: N/A   Occupational History  . real Investment banker, corporate    Social History Main Topics  . Smoking status: Former Smoker    Packs/day: 0.50    Years: 20.00    Types: Cigarettes    Quit date: 02/20/2015  . Smokeless tobacco: Never Used  . Alcohol use Yes     Comment: 10 drinks per week  . Drug use: No  . 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 None   Social History Narrative   1 biological son   1 adopted child   3 step children    Allergies: Allergies  Allergen Reactions  . Neosporin [Neomycin-Bacitracin Zn-Polymyx] Hives and Itching  . Polysporin [Bacitracin-Polymyxin B] Hives and Itching    Outpatient Meds: Current Outpatient  Prescriptions  Medication Sig Dispense Refill  . amLODipine (NORVASC) 5 MG tablet TAKE 1 TABLET DAILY 90 tablet 2  . atorvastatin (LIPITOR) 80 MG tablet TAKE 1 TABLET DAILY 90 tablet 0  . buPROPion (WELLBUTRIN XL) 300 MG 24 hr tablet TAKE 1 TABLET DAILY 90 tablet 1  . KRILL OIL PO Take 1 capsule by mouth daily.    Marland Kitchen PROBIOTIC PRODUCT PO Take 1 capsule by mouth daily. *Digestive Probiotic*    . zolpidem (AMBIEN) 10 MG tablet Take 1 tablet (10 mg total) by mouth at bedtime as needed. for sleep 30 tablet 5   No current facility-administered medications for this visit.       ___________________________________________________________________ Objective   Exam:  BP  128/84 (BP Location: Left Arm, Patient Position: Sitting, Cuff Size: Normal)   Pulse 80   Ht 5\' 11"  (1.803 m) Comment: height measured without shoes  Wt 208 lb (94.3 kg)   BMI 29.01 kg/m    General: this is a(n) Well-appearing middle-aged man, good muscle mass, normal vocal quality   Eyes: sclera anicteric, no redness  ENT: oral mucosa moist without lesions, no cervical or supraclavicular lymphadenopathy, good dentition  CV: RRR without murmur, S1/S2, no JVD, no peripheral edema  Resp: clear to auscultation bilaterally, normal RR and effort noted  GI: soft, no  tenderness, with active bowel sounds. No guarding or palpable organomegaly noted.  Skin; warm and dry, no rash or jaundice noted  Neuro: awake, alert and oriented x 3. Normal gross motor function and fluent speech   Assessment: Encounter Diagnosis  Name Primary?  Marland Kitchen Dysphagia, unspecified type Yes    Probable esophageal ring or stricture, less likely motility, even less likely malignancy given its time course.  Plan:  Upper endoscopy with probable dilation   The benefits and risks of the planned procedure were described in detail with the patient or (when appropriate) their health care proxy.  Risks were outlined as including, but not limited to, bleeding, infection, perforation, adverse medication reaction leading to cardiac or pulmonary decompensation, or pancreatitis (if ERCP).  The limitation of incomplete mucosal visualization was also discussed.  No guarantees or warranties were given.   Thank you for the courtesy of this consult.  Please call me with any questions or concerns.  Dakota [name] III  CC: Dakota Homans, MD

## 2016-02-19 ENCOUNTER — Inpatient Hospital Stay (HOSPITAL_COMMUNITY): Admission: RE | Admit: 2016-02-19 | Payer: Managed Care, Other (non HMO) | Source: Ambulatory Visit

## 2016-02-28 ENCOUNTER — Encounter: Payer: Self-pay | Admitting: Gastroenterology

## 2016-03-11 ENCOUNTER — Telehealth: Payer: Self-pay | Admitting: Family Medicine

## 2016-03-11 NOTE — Telephone Encounter (Signed)
Patient states he has a bad knee injury and wife is requesting patient to be worked in today.  States has appt with GI Friday and wife is afraid he will not be able to have it.
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
## 2016-03-11 NOTE — Telephone Encounter (Signed)
Pt is scheduled for tomorrow 

## 2016-03-12 ENCOUNTER — Ambulatory Visit: Payer: Self-pay

## 2016-03-12 ENCOUNTER — Encounter: Payer: Self-pay | Admitting: Family Medicine

## 2016-03-12 ENCOUNTER — Ambulatory Visit (INDEPENDENT_AMBULATORY_CARE_PROVIDER_SITE_OTHER): Payer: Managed Care, Other (non HMO) | Admitting: Family Medicine

## 2016-03-12 VITALS — BP 114/82 | HR 93 | Ht 72.0 in | Wt 203.0 lb

## 2016-03-12 DIAGNOSIS — M7652 Patellar tendinitis, left knee: Secondary | ICD-10-CM

## 2016-03-12 DIAGNOSIS — M25562 Pain in left knee: Secondary | ICD-10-CM

## 2016-03-12 DIAGNOSIS — I824Z9 Acute embolism and thrombosis of unspecified deep veins of unspecified distal lower extremity: Secondary | ICD-10-CM | POA: Insufficient documentation

## 2016-03-12 MED ORDER — MELOXICAM 15 MG PO TABS: 15.0000 mg | ORAL_TABLET | Freq: Every day | ORAL | 1 refills | Status: DC

## 2016-03-12 NOTE — Patient Instructions (Signed)
Good to see you  Ice 20 minutes 2 times daily. Usually after activity and before bed. pennsaid pinkie amount topically 2 times daily as needed.  Patella strap from [facility] with activity  See me again in 2-3 weeks if not better

## 2016-03-12 NOTE — Assessment & Plan Note (Signed)
Pain is more of a patellar tendinitis. We discussed possible bracing, topical anti-inflammatories and oral anti-inflammatory's given. Encourage him to continue home exercises. We discussed icing regimen. Follow-up again in 3 weeks. If not improved consider injection.

## 2016-03-13 ENCOUNTER — Ambulatory Visit (AMBULATORY_SURGERY_CENTER): Payer: Managed Care, Other (non HMO) | Admitting: Gastroenterology

## 2016-03-13 ENCOUNTER — Encounter: Payer: Self-pay | Admitting: Gastroenterology

## 2016-03-13 ENCOUNTER — Ambulatory Visit: Payer: Managed Care, Other (non HMO) | Admitting: Family Medicine

## 2016-03-13 VITALS — BP 125/80 | HR 75 | Temp 97.8°F | Resp 17 | Ht 71.0 in | Wt 208.0 lb

## 2016-03-13 DIAGNOSIS — K228 Other specified diseases of esophagus: Secondary | ICD-10-CM | POA: Diagnosis not present

## 2016-03-13 DIAGNOSIS — R1310 Dysphagia, unspecified: Secondary | ICD-10-CM | POA: Diagnosis not present

## 2016-03-13 DIAGNOSIS — R1319 Other dysphagia: Secondary | ICD-10-CM

## 2016-03-13 DIAGNOSIS — K222 Esophageal obstruction: Secondary | ICD-10-CM

## 2016-03-13 DIAGNOSIS — K2960 Other gastritis without bleeding: Secondary | ICD-10-CM | POA: Diagnosis not present

## 2016-03-13 MED ORDER — SODIUM CHLORIDE 0.9 % IV SOLN: 500.0000 mL | INTRAVENOUS | Status: DC

## 2016-03-13 NOTE — Progress Notes (Signed)
To PACU, vss patent aw report to rn 

## 2016-03-13 NOTE — Op Note (Signed)
Dakota [name]: Dakota [name] Procedure Date: 03/13/2016 9:40 AM MRN: [id number] Endoscopist: Dakota [name] , MD Age: 56 Referring MD:  Date of Birth: [birth year] Gender: Male Account #: [id number] Procedure:                Upper GI endoscopy Indications:              Dysphagia Medicines:                Monitored Anesthesia Care Procedure:                Pre-Anesthesia Assessment:                           - Prior to the procedure, a History and Physical                            was performed, and patient medications and                            allergies were reviewed. The patient's tolerance of                            previous anesthesia was also reviewed. The risks                            and benefits of the procedure and the sedation                            options and risks were discussed with the patient.                            All questions were answered, and informed consent                            was obtained. Prior Anticoagulants: The patient has                            taken no previous anticoagulant or antiplatelet                            agents. ASA Grade Assessment: II - A patient with                            mild systemic disease. After reviewing the risks                            and benefits, the patient was deemed in                            satisfactory condition to undergo the procedure.                           After obtaining informed consent, the endoscope was  passed under direct vision. Throughout the                            procedure, the patient's blood pressure, pulse, and                            oxygen saturations were monitored continuously. The                            Model GIF-HQ190 [id number]) scope was introduced                            through the mouth, and advanced to the second part                            of duodenum. The upper GI endoscopy was                      accomplished without difficulty. The patient                            tolerated the procedure well. Scope In: Scope Out: Findings:                 Diffuse moderate mucosal changes characterized by                            longitudinal furrowing were found in the lower half                            of the esophagus. Biopsies were obtained from the                            mid and distal esophagus with cold forceps for                            histology of suspected eosinophilic esophagitis.                           One moderate benign-appearing, intrinsic stenosis                            was found. This measured 8 mm (inner diameter) x                            less than one cm (in length) and was traversed.The                            scope passed with mild resistance. The scope was                            withdrawn after complete exam and biopsies taken.                            Dilation was performed with  a Maloney dilator with                            moderate resistance at 48 Fr.                           Diffuse moderate inflammation characterized by                            adherent blood and erosions was found in the                            gastric antrum. Several biopsies were obtained in                            the gastric body and in the gastric antrum with                            cold forceps for histology.                           The cardia and gastric fundus were normal on                            retroflexion.                           The examined duodenum was normal. Complications:            No immediate complications. Estimated Blood Loss:     Estimated blood loss: none. Impression:               - Longitudinally marked mucosa in the esophagus.                            Biopsied.                           - Benign-appearing esophageal stenosis. Dilated.                           - Chronic gastritis.                            - Normal examined duodenum.                           - Several biopsies were obtained in the gastric                            body and in the gastric antrum. Recommendation:           - Patient has a contact number available for                            emergencies. The signs and symptoms of potential  delayed complications were discussed with the                            patient. Return to normal activities tomorrow.                            Written discharge instructions were provided to the                            patient.                           - Resume previous diet.                           - Continue present medications.                           - Await pathology results. [name], MD 03/13/2016 10:16:44 AM This report has been signed electronically.

## 2016-03-13 NOTE — Progress Notes (Signed)
Called to room to assist during endoscopic procedure.  Patient ID and intended procedure confirmed with present staff. Received instructions for my participation in the procedure from the performing physician.  

## 2016-03-13 NOTE — Patient Instructions (Signed)
YOU HAD AN ENDOSCOPIC PROCEDURE TODAY AT [facility]:   Refer to the procedure report that was given to you for any specific questions about what was found during the examination.  If the procedure report does not answer your questions, please call your gastroenterologist to clarify.  If you requested that your care partner not be given the details of your procedure findings, then the procedure report has been included in a sealed envelope for you to review at your convenience later.  YOU SHOULD EXPECT: Some feelings of bloating in the abdomen. Passage of more gas than usual.  Walking can help get rid of the air that was put into your GI tract during the procedure and reduce the bloating. If you had a lower endoscopy (such as a colonoscopy or flexible sigmoidoscopy) you may notice spotting of blood in your stool or on the toilet paper. If you underwent a bowel prep for your procedure, you may not have a normal bowel movement for a few days.  Please Note:  You might notice some irritation and congestion in your nose or some drainage.  This is from the oxygen used during your procedure.  There is no need for concern and it should clear up in a day or so.  SYMPTOMS TO REPORT IMMEDIATELY:    Following upper endoscopy (EGD)  Vomiting of blood or coffee ground material  New chest pain or pain under the shoulder blades  Painful or persistently difficult swallowing  New shortness of breath  Fever of 100F or higher  Black, tarry-looking stools  For urgent or emergent issues, a gastroenterologist can be reached at any hour by calling [phone].   DIET:  We do recommend a small meal at first, but then you may proceed to your regular diet.  Drink plenty of fluids but you should avoid alcoholic beverages for 24 hours.  ACTIVITY:  You should plan to take it easy for the rest of today and you should NOT DRIVE or use heavy machinery until tomorrow (because of the sedation medicines used  during the test).    FOLLOW UP: Our staff will call the number listed on your records the next business day following your procedure to check on you and address any questions or concerns that you may have regarding the information given to you following your procedure. If we do not reach you, we will leave a message.  However, if you are feeling well and you are not experiencing any problems, there is no need to return our call.  We will assume that you have returned to your regular daily activities without incident.  If any biopsies were taken you will be contacted by phone or by letter within the next 1-3 weeks.  Please call us at [phone] if you have not heard about the biopsies in 3 weeks.    SIGNATURES/CONFIDENTIALITY: You and/or your care partner have signed paperwork which will be entered into your electronic medical record.  These signatures attest to the fact that that the information above on your After Visit Summary has been reviewed and is understood.  Full responsibility of the confidentiality of this discharge information lies with you and/or your care-partner.  After dilation diet given with instructions.  Stricture and gastritis information given.

## 2016-03-19 ENCOUNTER — Encounter: Payer: Self-pay | Admitting: Gastroenterology

## 2016-04-01 ENCOUNTER — Ambulatory Visit: Payer: Managed Care, Other (non HMO) | Admitting: Family Medicine

## 2016-04-17 ENCOUNTER — Other Ambulatory Visit: Payer: Self-pay

## 2016-04-17 MED ORDER — ATORVASTATIN CALCIUM 80 MG PO TABS: 80.0000 mg | ORAL_TABLET | Freq: Every day | ORAL | 2 refills | Status: DC

## 2016-05-08 ENCOUNTER — Encounter: Payer: Self-pay | Admitting: Gastroenterology

## 2016-06-12 ENCOUNTER — Encounter: Payer: Self-pay | Admitting: Gastroenterology

## 2016-06-12 ENCOUNTER — Ambulatory Visit (INDEPENDENT_AMBULATORY_CARE_PROVIDER_SITE_OTHER): Payer: Managed Care, Other (non HMO) | Admitting: Gastroenterology

## 2016-06-12 VITALS — BP 108/80 | HR 84 | Ht 71.0 in | Wt 202.4 lb

## 2016-06-12 DIAGNOSIS — K2100 Gastro-esophageal reflux disease with esophagitis, without bleeding: Secondary | ICD-10-CM

## 2016-06-12 DIAGNOSIS — R1310 Dysphagia, unspecified: Secondary | ICD-10-CM | POA: Diagnosis not present

## 2016-06-12 DIAGNOSIS — R1319 Other dysphagia: Secondary | ICD-10-CM

## 2016-06-12 DIAGNOSIS — K222 Esophageal obstruction: Secondary | ICD-10-CM

## 2016-06-12 NOTE — Patient Instructions (Signed)
If you are age 56 or older, your body mass index should be between 23-30. Your Body mass index is 28.23 kg/m. If this is out of the aforementioned range listed, please consider follow up with your Primary Care Provider.  If you are age 58 or younger, your body mass index should be between 19-25. Your Body mass index is 28.23 kg/m. If this is out of the aformentioned range listed, please consider follow up with your Primary Care Provider.   Thank you for choosing [facility]  Dr Wilfrid Lund III

## 2016-06-12 NOTE — Progress Notes (Signed)
     [facility] Progress Note  Chief Complaint: Dysphagia  Subjective  History:  This is a 56 year old man I saw her in January for dysphagia. EGD in February showed mucosal changes of possible eosinophilic esophagitis as well as a distal esophageal stricture. I performed a 33 French Maloney dilation and he has had relief of dysphagia. Biopsies showed mild eosinophilic infiltration. I recommended PPI, which she did for a few weeks and then stopped. He was not having any significant heartburn prior to that. He reports feeling well right now, with no dysphagia, odynophagia or vomiting.   ROS: Cardiovascular:  no chest pain Respiratory: no dyspnea  The patient's Past Medical, Family and Social History were reviewed and are on file in the EMR.  Objective:  Med list reviewed  Vital signs in last 24 hrs: Vitals:   06/12/16 0943  BP: 108/80  Pulse: 84    Physical Exam    HEENT: sclera anicteric, oral mucosa moist without lesions  Neck: supple, no thyromegaly, JVD or lymphadenopathy  Cardiac: RRR without murmurs, S1S2 heard, no peripheral edema  Pulm: clear to auscultation bilaterally, normal RR and effort noted  Abdomen: soft, No tenderness, with active bowel sounds. No guarding or palpable hepatosplenomegaly.  Skin; warm and dry, no jaundice or rash  Recent Labs: Esophageal biopsy reports as noted above Gastric biopsies negative for H. pylori  @ASSESSMENTPLANBEGIN @ Assessment: Encounter Diagnoses  Name Primary?  . Gastroesophageal reflux disease with esophagitis Yes  . Esophageal dysphagia   . Benign esophageal stricture     It is unclear if he truly has eosinophilic esophagitis, or if these changes were due to reflux. He does not have any ongoing symptoms of heartburn or regurgitation. He also seems to gotten good relief from 1 episode of dilation.  Plan: I do not think he needs a PPI at this point. If he develops recurrent dysphagia, he should resume  once daily OTC PPI for 2 or 3 weeks. If symptoms resolve, no further testing or medicines needed. If his dysphagia persists beyond that, he knows to contact me as he may require a repeat EGD with dilation.   Total time 15 minutes, over half spent in counseling and coordination of care.   Dakota [name]

## 2016-06-15 ENCOUNTER — Encounter: Payer: Self-pay | Admitting: Family Medicine

## 2016-06-15 ENCOUNTER — Ambulatory Visit (INDEPENDENT_AMBULATORY_CARE_PROVIDER_SITE_OTHER): Payer: 59 | Admitting: Family Medicine

## 2016-06-15 VITALS — BP 118/76 | HR 73 | Temp 97.9°F | Resp 18 | Wt 200.4 lb

## 2016-06-15 DIAGNOSIS — Z87891 Personal history of nicotine dependence: Secondary | ICD-10-CM

## 2016-06-15 DIAGNOSIS — E663 Overweight: Secondary | ICD-10-CM | POA: Diagnosis not present

## 2016-06-15 DIAGNOSIS — G4700 Insomnia, unspecified: Secondary | ICD-10-CM

## 2016-06-15 DIAGNOSIS — R739 Hyperglycemia, unspecified: Secondary | ICD-10-CM | POA: Diagnosis not present

## 2016-06-15 DIAGNOSIS — E782 Mixed hyperlipidemia: Secondary | ICD-10-CM

## 2016-06-15 DIAGNOSIS — Z0000 Encounter for general adult medical examination without abnormal findings: Secondary | ICD-10-CM | POA: Diagnosis not present

## 2016-06-15 DIAGNOSIS — I10 Essential (primary) hypertension: Secondary | ICD-10-CM | POA: Diagnosis not present

## 2016-06-15 MED ORDER — BUPROPION HCL ER (XL) 300 MG PO TB24: 300.0000 mg | ORAL_TABLET | Freq: Every day | ORAL | 1 refills | Status: DC

## 2016-06-15 MED ORDER — ZOLPIDEM TARTRATE 10 MG PO TABS: 5.0000 mg | ORAL_TABLET | Freq: Every evening | ORAL | 5 refills | Status: DC | PRN

## 2016-06-15 NOTE — Assessment & Plan Note (Signed)
Good weight loss with dite and exercise changes.

## 2016-06-15 NOTE — Assessment & Plan Note (Signed)
Encouraged heart healthy diet, increase exercise, avoid trans fats, consider a krill oil cap daily. Uses Atorvastatin daily

## 2016-06-15 NOTE — Assessment & Plan Note (Signed)
Well controlled, no changes to meds. Encouraged heart healthy diet such as the DASH diet and exercise as tolerated.  °

## 2016-06-15 NOTE — Progress Notes (Signed)
Subjective:  I acted as a Education administrator for Dr. Charlett Blake. [place], [place]  Patient ID: Dakota [name], male    DOB: 1960-12-02, 56 y.o.   MRN: [id number]  Chief Complaint  Patient presents with  . Follow-up  . Hypertension  . Hyperlipidemia    HPI  Patient is in today for a 6 month follow up. Following up on HTN, hyperlipidemia and other medical concerns. Patient has no acute concerns. He feels well. No recent febrile illness or hospitalizations. He has been eating less and has decreased carbohydrates and is exercising at least 3 times weekly. Is happy with this recent weight loss. His dysphagia is greatly improved status post a stretching with gastroenterology. He has had only one episode since February. He was restarted Prevacid and has had no further episodes. Denies CP/palp/SOB/HA/congestion/fevers or GU c/o. Taking meds as prescribed  Patient Care Team: [name], MD as PCP - General (Family Medicine) Stanford Breed Denice Bors, MD as Consulting Physician (Cardiology)   Past Medical History:  Diagnosis Date  . Allergy    cats  . Anxiety   . Asthma    childhood  . CAD (coronary artery disease)   . Chicken pox as a child  . Depression 12/25/2012  . Dysphagia 12/16/2015  . H/O tobacco use, presenting hazards to health 02/11/2011   1/2 ppd encouraged Has tried nicotine replacements and Chantix in past   . Hyperlipidemia   . Hypertension   . Insomnia 06/11/2013  . 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 Kidney stone on left side 09/19/2013  . Left knee pain 12/25/2012  . Mumps as a child  . Onychomycosis 09/30/2014  . Overweight 12/16/2015  . Preventative health care 09/24/2013    Past Surgical History:  Procedure Laterality Date  . COLONOSCOPY    . CORONARY STENT PLACEMENT  01-14-05  . LITHOTRIPSY  2/16    Family History  Problem Relation Age of Onset  . Multiple sclerosis Mother   . Cancer Father        esophagus/ brain  . Alcohol abuse Father        smoker  . Esophageal  cancer Father   . Seizures Sister        AVM  . COPD Sister   . Alcohol abuse Sister        addiction, recovered  . Heart disease Maternal Grandfather        MI  . Colon cancer Neg Hx   . Colon polyps Neg Hx   . Rectal cancer Neg Hx   . Stomach cancer Neg Hx     Social History   Social History  . Marital status: Legally Separated    Spouse name: N/A  . Number of children: 1  . Years of education: N/A   Occupational History  . real Investment banker, corporate    Social History Main Topics  . Smoking status: Former Smoker    Packs/day: 0.50    Years: 20.00    Types: Cigarettes    Quit date: 02/20/2015  . Smokeless tobacco: Never Used  . Alcohol use Yes     Comment: 10 drinks per week  . Drug use: No  . 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 Not on file   Social History Narrative   1 biological son   1 adopted child   3 step children  Outpatient Medications Prior to Visit  Medication Sig Dispense Refill  . amLODipine (NORVASC) 5 MG tablet TAKE 1 TABLET DAILY 90 tablet 2  . atorvastatin (LIPITOR) 80 MG tablet Take 1 tablet (80 mg total) by mouth daily. 90 tablet 2  . KRILL OIL PO Take 1 capsule by mouth daily.    . meloxicam (MOBIC) 15 MG tablet Take 1 tablet (15 mg total) by mouth daily. 90 tablet 1  . PROBIOTIC PRODUCT PO Take 1 capsule by mouth daily. *Digestive Probiotic*    . buPROPion (WELLBUTRIN XL) 300 MG 24 hr tablet TAKE 1 TABLET DAILY 90 tablet 1  . zolpidem (AMBIEN) 10 MG tablet Take 1 tablet (10 mg total) by mouth at bedtime as needed. for sleep 30 tablet 5  . 0.9 %  sodium chloride infusion      No facility-administered medications prior to visit.     Allergies  Allergen Reactions  . Neosporin [Neomycin-Bacitracin Zn-Polymyx] Hives and Itching  . Polysporin [Bacitracin-Polymyxin B] Hives and Itching    Review of Systems  Constitutional: Negative for  fever and malaise/fatigue.  HENT: Negative for congestion.   Eyes: Negative for blurred vision.  Respiratory: Negative for cough and shortness of breath.   Cardiovascular: Negative for chest pain, palpitations and leg swelling.  Gastrointestinal: Negative for vomiting.  Musculoskeletal: Negative for back pain.  Skin: Negative for rash.  Neurological: Negative for loss of consciousness and headaches.       Objective:    Physical Exam  Constitutional: He is oriented to person, place, and time. He appears well-developed and well-nourished. No distress.  HENT:  Head: Normocephalic and atraumatic.  Eyes: Conjunctivae are normal.  Neck: Normal range of motion. No thyromegaly present.  Cardiovascular: Normal rate and regular rhythm.   Pulmonary/Chest: Effort normal and breath sounds normal. He has no wheezes.  Abdominal: Soft. Bowel sounds are normal. There is no tenderness.  Musculoskeletal: Normal range of motion. He exhibits no edema or deformity.  Neurological: He is alert and oriented to person, place, and time.  Skin: Skin is warm and dry. He is not diaphoretic.  Psychiatric: He has a normal mood and affect.    BP 118/76 (BP Location: Left Arm, Patient Position: Sitting, Cuff Size: Normal)   Pulse 73   Temp 97.9 F (36.6 C) (Oral)   Resp 18   Wt 200 lb 6.4 oz (90.9 kg)   SpO2 96%   BMI 27.95 kg/m  Wt Readings from Last 3 Encounters:  06/15/16 200 lb 6.4 oz (90.9 kg)  06/12/16 202 lb 6 oz (91.8 kg)  03/13/16 208 lb (94.3 kg)   BP Readings from Last 3 Encounters:  06/15/16 118/76  06/12/16 108/80  03/13/16 125/80     Immunization History  Administered Date(s) Administered  . Influenza Whole 11/21/2012  . Influenza,inj,Quad PF,36+ Mos 02/20/2014, 01/15/2015, 12/16/2015    Health Maintenance  Topic Date Due  . Hepatitis C Screening  08/18/1960  . HIV Screening  05/02/1975  . TETANUS/TDAP  05/02/1979  . INFLUENZA VACCINE  09/02/2016  . COLONOSCOPY  04/05/2021     Lab Results  Component Value Date   WBC 7.2 09/20/2014   HGB 14.3 09/20/2014   HCT 42.1 09/20/2014   PLT 253.0 09/20/2014   GLUCOSE 93 09/20/2014   CHOL 138 09/20/2014   TRIG 147.0 09/20/2014   HDL 35.80 (L) 09/20/2014   LDLDIRECT 149.4 09/13/2007   LDLCALC 73 09/20/2014   ALT 35 09/20/2014   AST 27 09/20/2014   NA  136 09/20/2014   K 4.4 09/20/2014   CL 102 09/20/2014   CREATININE 0.91 09/20/2014   BUN 14 09/20/2014   CO2 29 09/20/2014   TSH 2.38 09/20/2014   PSA 0.63 09/14/2013   HGBA1C 5.8 09/20/2014    Lab Results  Component Value Date   TSH 2.38 09/20/2014   Lab Results  Component Value Date   WBC 7.2 09/20/2014   HGB 14.3 09/20/2014   HCT 42.1 09/20/2014   MCV 90.9 09/20/2014   PLT 253.0 09/20/2014   Lab Results  Component Value Date   NA 136 09/20/2014   K 4.4 09/20/2014   CO2 29 09/20/2014   GLUCOSE 93 09/20/2014   BUN 14 09/20/2014   CREATININE 0.91 09/20/2014   BILITOT 0.7 09/20/2014   ALKPHOS 92 09/20/2014   AST 27 09/20/2014   ALT 35 09/20/2014   PROT 7.9 09/20/2014   ALBUMIN 4.7 09/20/2014   CALCIUM 9.8 09/20/2014   ANIONGAP 8 03/11/2014   GFR 92.15 09/20/2014   Lab Results  Component Value Date   CHOL 138 09/20/2014   Lab Results  Component Value Date   HDL 35.80 (L) 09/20/2014   Lab Results  Component Value Date   LDLCALC 73 09/20/2014   Lab Results  Component Value Date   TRIG 147.0 09/20/2014   Lab Results  Component Value Date   CHOLHDL 4 09/20/2014   Lab Results  Component Value Date   HGBA1C 5.8 09/20/2014         Assessment & Plan:   Problem List Items Addressed This Visit    Hyperlipidemia, mixed - Primary    Encouraged heart healthy diet, increase exercise, avoid trans fats, consider a krill oil cap daily. Uses Atorvastatin daily      Relevant Orders   Lipid panel   Essential hypertension    Well controlled, no changes to meds. Encouraged heart healthy diet such as the DASH diet and exercise as  tolerated.       Relevant Orders   CBC   Comprehensive metabolic panel   TSH   H/O tobacco use, presenting hazards to health    Doing well off cigarettes for past year      Insomnia    Encouraged good sleep hygiene such as dark, quiet room. No blue/green glowing lights such as computer screens in bedroom. No alcohol or stimulants in evening. Cut down on caffeine as able. Regular exercise is helpful but not just prior to bed time. Uses Ambien prn infrequently      Preventative health care   Relevant Orders   PSA   Hyperglycemia    hgba1c acceptable, minimize simple carbs. Increase exercise as tolerated.       Overweight    Good weight loss with dite and exercise changes.          I have changed Dakota [name]'s zolpidem and buPROPion. I am also having him maintain his KRILL OIL PO, PROBIOTIC PRODUCT PO, amLODipine, meloxicam, and atorvastatin. We will stop administering sodium chloride.  Meds ordered this encounter  Medications  . zolpidem (AMBIEN) 10 MG tablet    Sig: Take 0.5-1 tablets (5-10 mg total) by mouth at bedtime as needed. for sleep    Dispense:  30 tablet    Refill:  5    Patient needs appointment for further refills  . buPROPion (WELLBUTRIN XL) 300 MG 24 hr tablet    Sig: Take 1 tablet (300 mg total) by mouth daily.    Dispense:  90  tablet    Refill:  1    CMA served as scribe during this visit. History, Physical and Plan performed by medical provider. Documentation and orders reviewed and attested to.  Penni Homans, MD

## 2016-06-15 NOTE — Assessment & Plan Note (Signed)
Encouraged good sleep hygiene such as dark, quiet room. No blue/green glowing lights such as computer screens in bedroom. No alcohol or stimulants in evening. Cut down on caffeine as able. Regular exercise is helpful but not just prior to bed time. Uses Ambien prn infrequently

## 2016-06-15 NOTE — Assessment & Plan Note (Signed)
Doing well off cigarettes for past year

## 2016-06-15 NOTE — Patient Instructions (Addendum)
Try Lidocaine gel by Aspercreme, Icy Hot, Salon pas numbing med Pennsaid is an NSAID like advil, salon pas makes an NSAID topical also  Call insurance and confirm they will pay for PSA for preventative screening purposes Cholesterol Cholesterol is a white, waxy, fat-like substance that is needed by the human body in small amounts. The liver makes all the cholesterol we need. Cholesterol is carried from the liver by the blood through the blood vessels. Deposits of cholesterol (plaques) may build up on blood vessel (artery) walls. Plaques make the arteries narrower and stiffer. Cholesterol plaques increase the risk for heart attack and stroke. You cannot feel your cholesterol level even if it is very high. The only way to know that it is high is to have a blood test. Once you know your cholesterol levels, you should keep a record of the test results. Work with your health care provider to keep your levels in the desired range. What do the results mean?  Total cholesterol is a rough measure of all the cholesterol in your blood.  LDL (low-density lipoprotein) is the "bad" cholesterol. This is the type that causes plaque to build up on the artery walls. You want this level to be low.  HDL (high-density lipoprotein) is the "good" cholesterol because it cleans the arteries and carries the LDL away. You want this level to be high.  Triglycerides are fat that the body can either burn for energy or store. High levels are closely linked to heart disease. What are the desired levels of cholesterol?  Total cholesterol below 200.  LDL below 100 for people who are at risk, below 70 for people at very high risk.  HDL above 40 is good. A level of 60 or higher is considered to be protective against heart disease.  Triglycerides below 150. How can I lower my cholesterol? Diet  Follow your diet program as told by your health care provider.  Choose fish or white meat chicken and [place], roasted or baked.  Limit fatty cuts of red meat, fried foods, and processed meats, such as sausage and lunch meats.  Eat lots of fresh fruits and vegetables.  Choose whole grains, beans, pasta, potatoes, and cereals.  Choose olive oil, corn oil, or canola oil, and use only small amounts.  Avoid butter, mayonnaise, shortening, or palm kernel oils.  Avoid foods with trans fats.  Drink skim or nonfat milk and eat low-fat or nonfat yogurt and cheeses. Avoid whole milk, cream, ice cream, egg yolks, and full-fat cheeses.  Healthier desserts include angel food cake, ginger snaps, animal crackers, hard candy, popsicles, and low-fat or nonfat frozen yogurt. Avoid pastries, cakes, pies, and cookies. Exercise  Follow your exercise program as told by your health care provider. A regular program:  Helps to decrease LDL and raise HDL.  Helps with weight control.  Do things that increase your activity level, such as gardening, walking, and taking the stairs.  Ask your health care provider about ways that you can be more active in your daily life. Medicine  Take over-the-counter and prescription medicines only as told by your health care provider.  Medicine may be prescribed by your health care provider to help lower cholesterol and decrease the risk for heart disease. This is usually done if diet and exercise have failed to bring down cholesterol levels.  If you have several risk factors, you may need medicine even if your levels are normal. This information is not intended to replace advice given to you by  your health care provider. Make sure you discuss any questions you have with your health care provider. Document Released: 10/14/2000 Document Revised: 08/17/2015 Document Reviewed: 07/20/2015 Elsevier Interactive Patient Education  2017 [employer].

## 2016-06-15 NOTE — Assessment & Plan Note (Signed)
hgba1c acceptable, minimize simple carbs. Increase exercise as tolerated.  

## 2016-07-06 ENCOUNTER — Ambulatory Visit (HOSPITAL_COMMUNITY)
Admission: RE | Admit: 2016-07-06 | Discharge: 2016-07-06 | Disposition: A | Discharge status: Home / Self Care | Payer: 59 | Source: Ambulatory Visit | Attending: Cardiovascular Disease | Admitting: Cardiovascular Disease

## 2016-07-06 DIAGNOSIS — I679 Cerebrovascular disease, unspecified: Secondary | ICD-10-CM

## 2016-07-06 DIAGNOSIS — I2510 Atherosclerotic heart disease of native coronary artery without angina pectoris: Secondary | ICD-10-CM | POA: Diagnosis not present

## 2016-07-06 DIAGNOSIS — I6523 Occlusion and stenosis of bilateral carotid arteries: Secondary | ICD-10-CM | POA: Diagnosis not present

## 2016-07-06 DIAGNOSIS — E785 Hyperlipidemia, unspecified: Secondary | ICD-10-CM | POA: Insufficient documentation

## 2016-07-06 DIAGNOSIS — Z87891 Personal history of nicotine dependence: Secondary | ICD-10-CM | POA: Insufficient documentation

## 2016-07-06 DIAGNOSIS — I6529 Occlusion and stenosis of unspecified carotid artery: Secondary | ICD-10-CM | POA: Diagnosis present

## 2016-07-06 DIAGNOSIS — I10 Essential (primary) hypertension: Secondary | ICD-10-CM | POA: Insufficient documentation

## 2016-08-22 ENCOUNTER — Other Ambulatory Visit: Payer: Self-pay | Admitting: Cardiology

## 2016-08-24 NOTE — Telephone Encounter (Signed)
REFILL 

## 2016-11-19 ENCOUNTER — Encounter: Payer: Self-pay | Admitting: Family Medicine

## 2016-11-19 ENCOUNTER — Other Ambulatory Visit (INDEPENDENT_AMBULATORY_CARE_PROVIDER_SITE_OTHER): Payer: 59

## 2016-11-19 ENCOUNTER — Ambulatory Visit (INDEPENDENT_AMBULATORY_CARE_PROVIDER_SITE_OTHER): Payer: 59 | Admitting: Family Medicine

## 2016-11-19 VITALS — BP 110/68 | HR 74 | Temp 98.5°F | Resp 18 | Ht 71.0 in | Wt 198.6 lb

## 2016-11-19 DIAGNOSIS — Z23 Encounter for immunization: Secondary | ICD-10-CM

## 2016-11-19 DIAGNOSIS — Z125 Encounter for screening for malignant neoplasm of prostate: Secondary | ICD-10-CM | POA: Diagnosis not present

## 2016-11-19 DIAGNOSIS — Z79899 Other long term (current) drug therapy: Secondary | ICD-10-CM | POA: Diagnosis not present

## 2016-11-19 DIAGNOSIS — I10 Essential (primary) hypertension: Secondary | ICD-10-CM

## 2016-11-19 DIAGNOSIS — R739 Hyperglycemia, unspecified: Secondary | ICD-10-CM | POA: Diagnosis not present

## 2016-11-19 DIAGNOSIS — E782 Mixed hyperlipidemia: Secondary | ICD-10-CM

## 2016-11-19 DIAGNOSIS — Z0000 Encounter for general adult medical examination without abnormal findings: Secondary | ICD-10-CM | POA: Diagnosis not present

## 2016-11-19 LAB — LIPID PANEL
CHOL/HDL RATIO: 4
Cholesterol: 156 mg/dL (ref 0–200)
HDL: 42 mg/dL (ref >39.00)
LDL Cholesterol: 85 mg/dL (ref 0–99)
NONHDL: 113.66
Triglycerides: 144 mg/dL (ref 0.0–149.0)
VLDL: 28.8 mg/dL (ref 0.0–40.0)

## 2016-11-19 LAB — COMPREHENSIVE METABOLIC PANEL
ALT: 41 U/L (ref 0–53)
AST: 27 U/L (ref 0–37)
Albumin: 4.9 g/dL (ref 3.5–5.2)
Alkaline Phosphatase: 88 U/L (ref 39–117)
BUN: 19 mg/dL (ref 6–23)
CO2: 27 meq/L (ref 19–32)
Calcium: 10.4 mg/dL (ref 8.4–10.5)
Chloride: 101 mEq/L (ref 96–112)
Creatinine, Ser: 0.89 mg/dL (ref 0.40–1.50)
GFR: 93.79 mL/min (ref >60.00)
GLUCOSE: 97 mg/dL (ref 70–99)
POTASSIUM: 4.6 meq/L (ref 3.5–5.1)
Sodium: 137 mEq/L (ref 135–145)
Total Bilirubin: 0.6 mg/dL (ref 0.2–1.2)
Total Protein: 8 g/dL (ref 6.0–8.3)

## 2016-11-19 LAB — TSH: TSH: 2.36 u[IU]/mL (ref 0.35–4.50)

## 2016-11-19 LAB — PSA: PSA: 0.84 ng/mL (ref 0.10–4.00)

## 2016-11-19 MED ORDER — ZOLPIDEM TARTRATE 10 MG PO TABS: 5.0000 mg | ORAL_TABLET | Freq: Every evening | ORAL | 5 refills | Status: DC | PRN

## 2016-11-19 MED ORDER — BUPROPION HCL ER (XL) 300 MG PO TB24: 300.0000 mg | ORAL_TABLET | Freq: Every day | ORAL | 1 refills | Status: DC

## 2016-11-19 MED ORDER — MELOXICAM 15 MG PO TABS: 15.0000 mg | ORAL_TABLET | Freq: Every day | ORAL | 1 refills | Status: DC

## 2016-11-19 NOTE — Patient Instructions (Addendum)
Call insurance and confirm they will cover the Hep C antibody test for preventative purposes per the CDC guidelines Consider the new shingles shot called Shingrix 2 shots over 6 months. Call insurance to confirm insurance  Preventive Care 40-64 Years, Male Preventive care refers to lifestyle choices and visits with your health care provider that can promote health and wellness. What does preventive care include?  A yearly physical exam. This is also called an annual well check.  Dental exams once or twice a year.  Routine eye exams. Ask your health care provider how often you should have your eyes checked.  Personal lifestyle choices, including: ? Daily care of your teeth and gums. ? Regular physical activity. ? Eating a healthy diet. ? Avoiding tobacco and drug use. ? Limiting alcohol use. ? Practicing safe sex. ? Taking low-dose aspirin every day starting at age 64. What happens during an annual well check? The services and screenings done by your health care provider during your annual well check will depend on your age, overall health, lifestyle risk factors, and family history of disease. Counseling Your health care provider may ask you questions about your:  Alcohol use.  Tobacco use.  Drug use.  Emotional well-being.  Home and relationship well-being.  Sexual activity.  Eating habits.  Work and work Statistician.  Screening You may have the following tests or measurements:  Height, weight, and BMI.  Blood pressure.  Lipid and cholesterol levels. These may be checked every 5 years, or more frequently if you are over 46 years old.  Skin check.  Lung cancer screening. You may have this screening every year starting at age 22 if you have a 30-pack-year history of smoking and currently smoke or have quit within the past 15 years.  Fecal occult blood test (FOBT) of the stool. You may have this test every year starting at age 73.  Flexible sigmoidoscopy or  colonoscopy. You may have a sigmoidoscopy every 5 years or a colonoscopy every 10 years starting at age 21.  Prostate cancer screening. Recommendations will vary depending on your family history and other risks.  Hepatitis C blood test.  Hepatitis B blood test.  Sexually transmitted disease (STD) testing.  Diabetes screening. This is done by checking your blood sugar (glucose) after you have not eaten for a while (fasting). You may have this done every 1-3 years.  Discuss your test results, treatment options, and if necessary, the need for more tests with your health care provider. Vaccines Your health care provider may recommend certain vaccines, such as:  Influenza vaccine. This is recommended every year.  Tetanus, diphtheria, and acellular pertussis (Tdap, Td) vaccine. You may need a Td booster every 10 years.  Varicella vaccine. You may need this if you have not been vaccinated.  Zoster vaccine. You may need this after age 50.  Measles, mumps, and rubella (MMR) vaccine. You may need at least one dose of MMR if you were born in 1957 or later. You may also need a second dose.  Pneumococcal 13-valent conjugate (PCV13) vaccine. You may need this if you have certain conditions and have not been vaccinated.  Pneumococcal polysaccharide (PPSV23) vaccine. You may need one or two doses if you smoke cigarettes or if you have certain conditions.  Meningococcal vaccine. You may need this if you have certain conditions.  Hepatitis A vaccine. You may need this if you have certain conditions or if you travel or work in places where you may be exposed to hepatitis  A.  Hepatitis B vaccine. You may need this if you have certain conditions or if you travel or work in places where you may be exposed to hepatitis B.  Haemophilus influenzae type b (Hib) vaccine. You may need this if you have certain risk factors.  Talk to your health care provider about which screenings and vaccines you need and  how often you need them. This information is not intended to replace advice given to you by your health care provider. Make sure you discuss any questions you have with your health care provider. Document Released: 02/15/2015 Document Revised: 10/09/2015 Document Reviewed: 11/20/2014 Elsevier Interactive Patient Education  2017 [employer].

## 2016-11-19 NOTE — Progress Notes (Signed)
Subjective:  I acted as a Education administrator for Dr. Charlett Blake. [place], [place]  Patient ID: Dakota [name], male    DOB: Nov 28, 1960, 56 y.o.   MRN: [id number]  No chief complaint on file.   HPI  Patient is in today for an annual exam And follow-up on chronic medical problems including hypertension, hyperlipidemia, hyperglycemia and insomnia. Overall he feels he is doing well. He does continue to use the Ambien from time to time although not nightly. He gets good results but tries not to take it routinely.No recent febrile illness or acute hospitalizations. Denies CP/palp/SOB/HA/congestion/fevers/GI or GU c/o. Taking meds as prescribed  Patient Care Team: [name], MD as PCP - General (Family Medicine) Stanford Breed Denice Bors, MD as Consulting Physician (Cardiology)   Past Medical History:  Diagnosis Date  . Allergy    cats  . Anxiety   . Asthma    childhood  . CAD (coronary artery disease)   . Chicken pox as a child  . Depression 12/25/2012  . Dysphagia 12/16/2015  . H/O tobacco use, presenting hazards to health 02/11/2011   1/2 ppd encouraged Has tried nicotine replacements and Chantix in past   . Hyperlipidemia   . Hypertension   . Insomnia 06/11/2013  . 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 Kidney stone on left side 09/19/2013  . Left knee pain 12/25/2012  . Mumps as a child  . Onychomycosis 09/30/2014  . Overweight 12/16/2015  . Preventative health care 09/24/2013    Past Surgical History:  Procedure Laterality Date  . COLONOSCOPY    . CORONARY STENT PLACEMENT  01-14-05  . LITHOTRIPSY  2/16    Family History  Problem Relation Age of Onset  . Multiple sclerosis Mother   . Cancer Father        esophagus/ brain  . Alcohol abuse Father        smoker  . Esophageal cancer Father   . Seizures Sister        AVM  . COPD Sister   . Alcohol abuse Sister        addiction, recovered  . Heart disease Maternal Grandfather        MI  . Colon cancer Neg Hx   . Colon polyps Neg Hx    . Rectal cancer Neg Hx   . Stomach cancer Neg Hx     Social History   Social History  . Marital status: Legally Separated    Spouse name: N/A  . Number of children: 1  . Years of education: N/A   Occupational History  . real Investment banker, corporate    Social History Main Topics  . Smoking status: Former Smoker    Packs/day: 0.50    Years: 20.00    Types: Cigarettes    Quit date: 02/20/2015  . Smokeless tobacco: Never Used  . Alcohol use Yes     Comment: 10 drinks per week  . Drug use: No  . 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 Not on file   Social History Narrative   1 biological son   1 adopted child   3 step children    Outpatient Medications Prior to Visit  Medication Sig Dispense Refill  . amLODipine (NORVASC) 5 MG tablet TAKE 1 TABLET DAILY 90 tablet 1  . atorvastatin (LIPITOR) 80 MG tablet Take 1 tablet (80 mg  total) by mouth daily. 90 tablet 2  . KRILL OIL PO Take 1 capsule by mouth daily.    Marland Kitchen PROBIOTIC PRODUCT PO Take 1 capsule by mouth daily. *Digestive Probiotic*    . buPROPion (WELLBUTRIN XL) 300 MG 24 hr tablet Take 1 tablet (300 mg total) by mouth daily. 90 tablet 1  . meloxicam (MOBIC) 15 MG tablet Take 1 tablet (15 mg total) by mouth daily. 90 tablet 1  . zolpidem (AMBIEN) 10 MG tablet Take 0.5-1 tablets (5-10 mg total) by mouth at bedtime as needed. for sleep 30 tablet 5   No facility-administered medications prior to visit.     Allergies  Allergen Reactions  . Neosporin [Neomycin-Bacitracin Zn-Polymyx] Hives and Itching  . Polysporin [Bacitracin-Polymyxin B] Hives and Itching    Review of Systems  Constitutional: Negative for fever and malaise/fatigue.  HENT: Negative for congestion.   Eyes: Negative for blurred vision.  Respiratory: Negative for cough and shortness of breath.   Cardiovascular: Negative for chest pain, palpitations and leg  swelling.  Gastrointestinal: Negative for vomiting.  Musculoskeletal: Negative for back pain.  Skin: Negative for rash.  Neurological: Negative for loss of consciousness and headaches.       Objective:    Physical Exam  Constitutional: He is oriented to person, place, and time. He appears well-developed and well-nourished. No distress.  HENT:  Head: Normocephalic and atraumatic.  Eyes: Conjunctivae are normal.  Neck: Normal range of motion. Neck supple. No thyromegaly present.  Cardiovascular: Normal rate, regular rhythm and normal heart sounds.   No murmur heard. Pulmonary/Chest: Effort normal and breath sounds normal. No respiratory distress. He has no wheezes.  Abdominal: Soft. Bowel sounds are normal. He exhibits no mass. There is no tenderness.  Musculoskeletal: Normal range of motion. He exhibits no edema or deformity.  Lymphadenopathy:    He has no cervical adenopathy.  Neurological: He is alert and oriented to person, place, and time.  Skin: Skin is warm and dry. He is not diaphoretic.  Psychiatric: He has a normal mood and affect. His behavior is normal.    BP 110/68 (BP Location: Left Arm, Patient Position: Sitting, Cuff Size: Normal)   Pulse 74   Temp 98.5 F (36.9 C) (Oral)   Resp 18   Ht 5\' 11"  (1.803 m)   Wt 198 lb 9.6 oz (90.1 kg)   SpO2 96%   BMI 27.70 kg/m  Wt Readings from Last 3 Encounters:  11/19/16 198 lb 9.6 oz (90.1 kg)  06/15/16 200 lb 6.4 oz (90.9 kg)  06/12/16 202 lb 6 oz (91.8 kg)   BP Readings from Last 3 Encounters:  11/19/16 110/68  06/15/16 118/76  06/12/16 108/80     Immunization History  Administered Date(s) Administered  . Influenza Whole 11/21/2012  . Influenza,inj,Quad PF,6+ Mos 02/20/2014, 01/15/2015, 12/16/2015, 11/19/2016    Health Maintenance  Topic Date Due  . Hepatitis C Screening  1961-01-25  . HIV Screening  05/02/1975  . TETANUS/TDAP  05/02/1979  . INFLUENZA VACCINE  09/02/2016  . COLONOSCOPY  04/05/2021      Lab Results  Component Value Date   WBC 7.2 09/20/2014   HGB 14.3 09/20/2014   HCT 42.1 09/20/2014   PLT 253.0 09/20/2014   GLUCOSE 93 09/20/2014   CHOL 138 09/20/2014   TRIG 147.0 09/20/2014   HDL 35.80 (L) 09/20/2014   LDLDIRECT 149.4 09/13/2007   LDLCALC 73 09/20/2014   ALT 35 09/20/2014   AST 27 09/20/2014   NA 136 09/20/2014  K 4.4 09/20/2014   CL 102 09/20/2014   CREATININE 0.91 09/20/2014   BUN 14 09/20/2014   CO2 29 09/20/2014   TSH 2.38 09/20/2014   PSA 0.63 09/14/2013   HGBA1C 5.8 09/20/2014    Lab Results  Component Value Date   TSH 2.38 09/20/2014   Lab Results  Component Value Date   WBC 7.2 09/20/2014   HGB 14.3 09/20/2014   HCT 42.1 09/20/2014   MCV 90.9 09/20/2014   PLT 253.0 09/20/2014   Lab Results  Component Value Date   NA 136 09/20/2014   K 4.4 09/20/2014   CO2 29 09/20/2014   GLUCOSE 93 09/20/2014   BUN 14 09/20/2014   CREATININE 0.91 09/20/2014   BILITOT 0.7 09/20/2014   ALKPHOS 92 09/20/2014   AST 27 09/20/2014   ALT 35 09/20/2014   PROT 7.9 09/20/2014   ALBUMIN 4.7 09/20/2014   CALCIUM 9.8 09/20/2014   ANIONGAP 8 03/11/2014   GFR 92.15 09/20/2014   Lab Results  Component Value Date   CHOL 138 09/20/2014   Lab Results  Component Value Date   HDL 35.80 (L) 09/20/2014   Lab Results  Component Value Date   LDLCALC 73 09/20/2014   Lab Results  Component Value Date   TRIG 147.0 09/20/2014   Lab Results  Component Value Date   CHOLHDL 4 09/20/2014   Lab Results  Component Value Date   HGBA1C 5.8 09/20/2014         Assessment & Plan:   Problem List Items Addressed This Visit    Hyperlipidemia, mixed    Tolerating statin, encouraged heart healthy diet, avoid trans fats, minimize simple carbs and saturated fats. Increase exercise as tolerated      Essential hypertension    Well controlled, no changes to meds. Encouraged heart healthy diet such as the DASH diet and exercise as tolerated.       Preventative  health care - Primary    Patient encouraged to maintain heart healthy diet, regular exercise, adequate sleep. Consider daily probiotics. Take medications as prescribed. Given and reviewed copy of ACP documents from [employer] and encouraged to complete and return.      Relevant Orders   PSA   Hyperglycemia    hgba1c acceptable, minimize simple carbs. Increase exercise as tolerated.        Other Visit Diagnoses    Needs flu shot       Relevant Orders   Flu Vaccine QUAD 6+ mos PF IM (Fluarix Quad PF) (Completed)   High risk medication use       Relevant Orders   Pain Mgmt, Profile 8 w/Conf, U      I am having Mr. Ace maintain his KRILL OIL PO, PROBIOTIC PRODUCT PO, atorvastatin, amLODipine, zolpidem, buPROPion, and meloxicam.  Meds ordered this encounter  Medications  . zolpidem (AMBIEN) 10 MG tablet    Sig: Take 0.5-1 tablets (5-10 mg total) by mouth at bedtime as needed. for sleep    Dispense:  30 tablet    Refill:  5  . buPROPion (WELLBUTRIN XL) 300 MG 24 hr tablet    Sig: Take 1 tablet (300 mg total) by mouth daily.    Dispense:  90 tablet    Refill:  1  . meloxicam (MOBIC) 15 MG tablet    Sig: Take 1 tablet (15 mg total) by mouth daily.    Dispense:  90 tablet    Refill:  1    CMA served  as scribe during this visit. History, Physical and Plan performed by medical provider. Documentation and orders reviewed and attested to.  Penni Homans, MD

## 2016-11-19 NOTE — Assessment & Plan Note (Signed)
Tolerating statin, encouraged heart healthy diet, avoid trans fats, minimize simple carbs and saturated fats. Increase exercise as tolerated 

## 2016-11-19 NOTE — Assessment & Plan Note (Signed)
Patient encouraged to maintain heart healthy diet, regular exercise, adequate sleep. Consider daily probiotics. Take medications as prescribed. Given and reviewed copy of ACP documents from [place] Secretary of State and encouraged to complete and return 

## 2016-11-19 NOTE — Assessment & Plan Note (Signed)
hgba1c acceptable, minimize simple carbs. Increase exercise as tolerated.  

## 2016-11-19 NOTE — Assessment & Plan Note (Signed)
Well controlled, no changes to meds. Encouraged heart healthy diet such as the DASH diet and exercise as tolerated.  °

## 2016-11-22 LAB — PAIN MGMT, PROFILE 8 W/CONF, U
6 ACETYLMORPHINE: NEGATIVE ng/mL (ref <10)
ALCOHOL METABOLITES: POSITIVE ng/mL — AB (ref <500)
AMPHETAMINES: NEGATIVE ng/mL (ref <500)
BENZODIAZEPINES: NEGATIVE ng/mL (ref <100)
Buprenorphine, Urine: NEGATIVE ng/mL (ref <5)
COCAINE METABOLITE: NEGATIVE ng/mL (ref <150)
CREATININE: 72.5 mg/dL
ETHYL GLUCURONIDE (ETG): 20309 ng/mL — AB (ref <500)
Ethyl Sulfate (ETS): 2443 ng/mL — ABNORMAL HIGH (ref <100)
MARIJUANA METABOLITE: NEGATIVE ng/mL (ref <20)
MDMA: NEGATIVE ng/mL (ref <500)
OPIATES: NEGATIVE ng/mL (ref <100)
Oxidant: NEGATIVE ug/mL (ref <200)
Oxycodone: NEGATIVE ng/mL (ref <100)
PH: 5.65 (ref 4.5–9.0)

## 2016-11-25 ENCOUNTER — Encounter: Payer: Self-pay | Admitting: Family Medicine

## 2016-11-30 NOTE — Telephone Encounter (Signed)
Pt dropped off document for provider to fill out (Physician Results Form 1 page) Pt would like document to be faxed to [phone] and to call him to let him know when document was faxed. Pt tel [phone]. Document put at front office tray.

## 2016-12-01 ENCOUNTER — Telehealth: Payer: Self-pay | Admitting: *Deleted

## 2016-12-01 NOTE — Telephone Encounter (Signed)
Received Physician Results Form, completed as much as possible [to be faxed to [phone]; forwarded to provider/SLS 10/30

## 2016-12-10 NOTE — Telephone Encounter (Signed)
See telephone 12/01/2016.

## 2016-12-27 ENCOUNTER — Other Ambulatory Visit: Payer: Self-pay | Admitting: Family Medicine

## 2016-12-28 NOTE — Telephone Encounter (Signed)
So he must not be aware he has refills? He should be due til April

## 2016-12-28 NOTE — Telephone Encounter (Signed)
Requesting:Ambien Contract:yes [id number] risk due to alcohol in system next screen 05/20/16 Last OV:11/19/16 Next OV:05/14/16 Last Refill:11/19/16  [id number]   Please advise

## 2016-12-30 NOTE — Telephone Encounter (Signed)
Spoke with pharmacy. They have rx on file from 11/19/16. They will delete request and states he picked up refill 2 days. ago.

## 2017-01-12 ENCOUNTER — Other Ambulatory Visit: Payer: Self-pay | Admitting: Cardiology

## 2017-02-20 ENCOUNTER — Other Ambulatory Visit: Payer: Self-pay | Admitting: Cardiology

## 2017-03-29 ENCOUNTER — Other Ambulatory Visit: Payer: Self-pay | Admitting: Family Medicine

## 2017-05-10 ENCOUNTER — Telehealth: Payer: Self-pay | Admitting: Cardiology

## 2017-05-10 NOTE — Telephone Encounter (Signed)
Spoke with pt, aware no lab work needed at this time.

## 2017-05-10 NOTE — Telephone Encounter (Signed)
New Message:     Pt have not seen Dr Stanford Breed since 2017.He has an appt with Eulas Post on 05-27-17.His question is will,he need lab work before his office visit?

## 2017-05-14 ENCOUNTER — Ambulatory Visit (INDEPENDENT_AMBULATORY_CARE_PROVIDER_SITE_OTHER): Payer: 59 | Admitting: Family Medicine

## 2017-05-14 ENCOUNTER — Encounter: Payer: Self-pay | Admitting: Family Medicine

## 2017-05-14 VITALS — BP 121/81 | HR 83 | Temp 98.2°F | Resp 16 | Ht 70.87 in | Wt 206.8 lb

## 2017-05-14 DIAGNOSIS — N529 Male erectile dysfunction, unspecified: Secondary | ICD-10-CM

## 2017-05-14 DIAGNOSIS — R739 Hyperglycemia, unspecified: Secondary | ICD-10-CM

## 2017-05-14 DIAGNOSIS — Z79899 Other long term (current) drug therapy: Secondary | ICD-10-CM

## 2017-05-14 DIAGNOSIS — G4700 Insomnia, unspecified: Secondary | ICD-10-CM

## 2017-05-14 DIAGNOSIS — E782 Mixed hyperlipidemia: Secondary | ICD-10-CM | POA: Diagnosis not present

## 2017-05-14 DIAGNOSIS — I10 Essential (primary) hypertension: Secondary | ICD-10-CM | POA: Diagnosis not present

## 2017-05-14 DIAGNOSIS — Z0000 Encounter for general adult medical examination without abnormal findings: Secondary | ICD-10-CM | POA: Diagnosis not present

## 2017-05-14 MED ORDER — SILDENAFIL CITRATE 20 MG PO TABS: 20.0000 mg | ORAL_TABLET | Freq: Every day | ORAL | 1 refills | Status: DC | PRN

## 2017-05-14 MED ORDER — ZOLPIDEM TARTRATE 10 MG PO TABS: 5.0000 mg | ORAL_TABLET | Freq: Every evening | ORAL | 5 refills | Status: DC | PRN

## 2017-05-14 MED ORDER — BUPROPION HCL ER (XL) 300 MG PO TB24: 300.0000 mg | ORAL_TABLET | Freq: Every day | ORAL | 1 refills | Status: DC

## 2017-05-14 NOTE — Assessment & Plan Note (Signed)
Tolerating statin, encouraged heart healthy diet, avoid trans fats, minimize simple carbs and saturated fats. Increase exercise as tolerated 

## 2017-05-14 NOTE — Assessment & Plan Note (Signed)
hgba1c acceptable, minimize simple carbs. Increase exercise as tolerated. Continue current meds 

## 2017-05-14 NOTE — Progress Notes (Signed)
Subjective:  I acted as a Education administrator for [employer]. [place], [place]    Patient ID: Dakota [name], male    DOB: 1960/10/23, 57 y.o.   MRN: [id number]  Chief Complaint  Patient presents with  . Follow-up    HPI  Patient is in today for follow up visit. He feels well today. No recent febrile illness or hospitalizations. He continues to struggle with insomnia but responds well to Zolpidem when he needs it. No complaints of polyuria or polydipsia. Denies CP/palp/SOB/HA/congestion/fevers/GI or GU c/o. Taking meds as prescribed  Patient Care Team: [name], MD as PCP - General (Family Medicine) Stanford Breed Denice Bors, MD as Consulting Physician (Cardiology)   Past Medical History:  Diagnosis Date  . Allergy    cats  . Anxiety   . Asthma    childhood  . CAD (coronary artery disease)   . Chicken pox as a child  . Depression 12/25/2012  . Dysphagia 12/16/2015  . H/O tobacco use, presenting hazards to health 02/11/2011   1/2 ppd encouraged Has tried nicotine replacements and Chantix in past   . Hyperlipidemia   . Hypertension   . Insomnia 06/11/2013  . 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 Kidney stone on left side 09/19/2013  . Left knee pain 12/25/2012  . Mumps as a child  . Onychomycosis 09/30/2014  . Overweight 12/16/2015  . Preventative health care 09/24/2013    Past Surgical History:  Procedure Laterality Date  . COLONOSCOPY    . CORONARY STENT PLACEMENT  01-14-05  . LITHOTRIPSY  2/16    Family History  Problem Relation Age of Onset  . Multiple sclerosis Mother   . Cancer Father        esophagus/ brain  . Alcohol abuse Father        smoker  . Esophageal cancer Father   . Seizures Sister        AVM  . COPD Sister   . Alcohol abuse Sister        addiction, recovered  . Heart disease Maternal Grandfather        MI  . Colon cancer Neg Hx   . Colon polyps Neg Hx   . Rectal cancer Neg Hx   . Stomach cancer Neg Hx     Social History   Socioeconomic History    . Marital status: Legally Separated    Spouse name: Not on file  . Number of children: 1  . Years of education: Not on file  . Highest education level: Not on file  Occupational History  . Occupation: Clinical cytogeneticist  Social Needs  . Financial resource strain: Not on file  . Food insecurity:    Worry: Not on file    Inability: Not on file  . Transportation needs:    Medical: Not on file    Non-medical: Not on file  Tobacco Use  . Smoking status: Former Smoker    Packs/day: 0.50    Years: 20.00    Pack years: 10.00    Types: Cigarettes    Last attempt to quit: 02/20/2015    Years since quitting: 2.2  . Smokeless tobacco: Never Used  Substance and Sexual Activity  . Alcohol use: Yes    Comment: 10 drinks per week  . Drug use: No  . Sexual activity: Yes    Partners: Female    Comment: wife, real Investment banker, corporate, no dairy, minimal fried foods  Lifestyle  . Physical activity:  Days per week: Not on file    Minutes per session: Not on file  . Stress: Not on file  Relationships  . Social connections:    Talks on phone: Not on file    Gets together: Not on file    Attends religious service: Not on file    Active member of club or organization: Not on file    Attends meetings of clubs or organizations: Not on file    Relationship status: Not on file  . Intimate partner violence:    Fear of current or ex partner: Not on file    Emotionally abused: Not on file    Physically abused: Not on file    Forced sexual activity: Not on file  Other Topics Concern  . Not on file  Social History Narrative   1 biological son   1 adopted child   3 step children    Outpatient Medications Prior to Visit  Medication Sig Dispense Refill  . amLODipine (NORVASC) 5 MG tablet TAKE 1 TABLET DAILY 90 tablet 1  . atorvastatin (LIPITOR) 80 MG tablet TAKE 1 TABLET DAILY 30 tablet 0  . KRILL OIL PO Take 1 capsule by mouth daily.    . meloxicam (MOBIC) 15 MG tablet TAKE 1 TABLET DAILY  90 tablet 1  . PROBIOTIC PRODUCT PO Take 1 capsule by mouth daily. *Digestive Probiotic*    . buPROPion (WELLBUTRIN XL) 300 MG 24 hr tablet Take 1 tablet (300 mg total) by mouth daily. 90 tablet 1  . zolpidem (AMBIEN) 10 MG tablet Take 0.5-1 tablets (5-10 mg total) by mouth at bedtime as needed. for sleep 30 tablet 5   No facility-administered medications prior to visit.     Allergies  Allergen Reactions  . Neosporin [Neomycin-Bacitracin Zn-Polymyx] Hives and Itching  . Polysporin [Bacitracin-Polymyxin B] Hives and Itching    Review of Systems  Constitutional: Negative for fever and malaise/fatigue.  HENT: Negative for congestion.   Eyes: Negative for blurred vision.  Respiratory: Negative for shortness of breath.   Cardiovascular: Negative for chest pain, palpitations and leg swelling.  Gastrointestinal: Negative for abdominal pain, blood in stool and nausea.  Genitourinary: Negative for dysuria and frequency.  Musculoskeletal: Negative for falls.  Skin: Negative for rash.  Neurological: Negative for dizziness, loss of consciousness and headaches.  Endo/Heme/Allergies: Negative for environmental allergies.  Psychiatric/Behavioral: Negative for depression. The patient has insomnia. The patient is not nervous/anxious.        Objective:    Physical Exam  Constitutional: He is oriented to person, place, and time. He appears well-developed and well-nourished. No distress.  HENT:  Head: Normocephalic and atraumatic.  Nose: Nose normal.  Eyes: Right eye exhibits no discharge. Left eye exhibits no discharge.  Neck: Normal range of motion. Neck supple.  Cardiovascular: Normal rate and regular rhythm.  No murmur heard. Pulmonary/Chest: Effort normal and breath sounds normal.  Abdominal: Soft. Bowel sounds are normal. There is no tenderness.  Musculoskeletal: He exhibits no edema.  Neurological: He is alert and oriented to person, place, and time.  Skin: Skin is warm and dry.    Psychiatric: He has a normal mood and affect.  Nursing note and vitals reviewed.   BP 121/81 (BP Location: Left Arm, Patient Position: Sitting, Cuff Size: Large)   Pulse 83   Temp 98.2 F (36.8 C) (Oral)   Resp 16   Ht 5' 10.87" (1.8 m)   Wt 206 lb 12.8 oz (93.8 kg)   SpO2 98%  BMI 28.95 kg/m  Wt Readings from Last 3 Encounters:  05/14/17 206 lb 12.8 oz (93.8 kg)  11/19/16 198 lb 9.6 oz (90.1 kg)  06/15/16 200 lb 6.4 oz (90.9 kg)   BP Readings from Last 3 Encounters:  05/14/17 121/81  11/19/16 110/68  06/15/16 118/76     Immunization History  Administered Date(s) Administered  . Influenza Whole 11/21/2012  . Influenza,inj,Quad PF,6+ Mos 02/20/2014, 01/15/2015, 12/16/2015, 11/19/2016    Health Maintenance  Topic Date Due  . Hepatitis C Screening  1960/08/12  . HIV Screening  05/02/1975  . TETANUS/TDAP  05/02/1979  . INFLUENZA VACCINE  09/02/2017  . COLONOSCOPY  04/05/2021    Lab Results  Component Value Date   WBC 7.2 09/20/2014   HGB 14.3 09/20/2014   HCT 42.1 09/20/2014   PLT 253.0 09/20/2014   GLUCOSE 97 11/19/2016   CHOL 156 11/19/2016   TRIG 144.0 11/19/2016   HDL 42.00 11/19/2016   LDLDIRECT 149.4 09/13/2007   LDLCALC 85 11/19/2016   ALT 41 11/19/2016   AST 27 11/19/2016   NA 137 11/19/2016   K 4.6 11/19/2016   CL 101 11/19/2016   CREATININE 0.89 11/19/2016   BUN 19 11/19/2016   CO2 27 11/19/2016   TSH 2.36 11/19/2016   PSA 0.84 11/19/2016   HGBA1C 5.8 09/20/2014    Lab Results  Component Value Date   TSH 2.36 11/19/2016   Lab Results  Component Value Date   WBC 7.2 09/20/2014   HGB 14.3 09/20/2014   HCT 42.1 09/20/2014   MCV 90.9 09/20/2014   PLT 253.0 09/20/2014   Lab Results  Component Value Date   NA 137 11/19/2016   K 4.6 11/19/2016   CO2 27 11/19/2016   GLUCOSE 97 11/19/2016   BUN 19 11/19/2016   CREATININE 0.89 11/19/2016   BILITOT 0.6 11/19/2016   ALKPHOS 88 11/19/2016   AST 27 11/19/2016   ALT 41 11/19/2016    PROT 8.0 11/19/2016   ALBUMIN 4.9 11/19/2016   CALCIUM 10.4 11/19/2016   ANIONGAP 8 03/11/2014   GFR 93.79 11/19/2016   Lab Results  Component Value Date   CHOL 156 11/19/2016   Lab Results  Component Value Date   HDL 42.00 11/19/2016   Lab Results  Component Value Date   LDLCALC 85 11/19/2016   Lab Results  Component Value Date   TRIG 144.0 11/19/2016   Lab Results  Component Value Date   CHOLHDL 4 11/19/2016   Lab Results  Component Value Date   HGBA1C 5.8 09/20/2014         Assessment & Plan:   Problem List Items Addressed This Visit    Hyperlipidemia, mixed    Tolerating statin, encouraged heart healthy diet, avoid trans fats, minimize simple carbs and saturated fats. Increase exercise as tolerated      Relevant Medications   sildenafil (REVATIO) 20 MG tablet   Other Relevant Orders   Lipid panel   Essential hypertension    Well controlled, no changes to meds. Encouraged heart healthy diet such as the DASH diet and exercise as tolerated.       Relevant Medications   sildenafil (REVATIO) 20 MG tablet   Other Relevant Orders   CBC   Comprehensive metabolic panel   TSH   Insomnia    Encouraged good sleep hygiene such as dark, quiet room. No blue/green glowing lights such as computer screens in bedroom. No alcohol or stimulants in evening. Cut down on caffeine as able. Regular  exercise is helpful but not just prior to bed time.       Preventative health care   Relevant Orders   PSA   Hyperglycemia    hgba1c acceptable, minimize simple carbs. Increase exercise as tolerated. Continue current meds      Relevant Orders   Hemoglobin A1c   Erectile dysfunction    Will try sildenafil 20 mg prn      Relevant Orders   PSA    Other Visit Diagnoses    High risk medication use    -  Primary   Relevant Orders   Pain Mgmt, Profile 8 w/Conf, U      I am having Dakota [name] start on sildenafil. I am also having him maintain his KRILL OIL PO,  PROBIOTIC PRODUCT PO, atorvastatin, amLODipine, meloxicam, buPROPion, and zolpidem.  Meds ordered this encounter  Medications  . buPROPion (WELLBUTRIN XL) 300 MG 24 hr tablet    Sig: Take 1 tablet (300 mg total) by mouth daily.    Dispense:  90 tablet    Refill:  1  . zolpidem (AMBIEN) 10 MG tablet    Sig: Take 0.5-1 tablets (5-10 mg total) by mouth at bedtime as needed. for sleep    Dispense:  30 tablet    Refill:  5  . sildenafil (REVATIO) 20 MG tablet    Sig: Take 1-5 tablets (20-100 mg total) by mouth daily as needed.    Dispense:  35 tablet    Refill:  1    CMA served as scribe during this visit. History, Physical and Plan performed by medical provider. Documentation and orders reviewed and attested to.  Penni Homans, MD

## 2017-05-14 NOTE — Patient Instructions (Signed)
Shingrix is the shingles shot, 2 shots over 2-6 months   Insomnia Insomnia is a sleep disorder that makes it difficult to fall asleep or to stay asleep. Insomnia can cause tiredness (fatigue), low energy, difficulty concentrating, mood swings, and poor performance at work or school. There are three different ways to classify insomnia:  Difficulty falling asleep.  Difficulty staying asleep.  Waking up too early in the morning.  Any type of insomnia can be long-term (chronic) or short-term (acute). Both are common. Short-term insomnia usually lasts for three months or less. Chronic insomnia occurs at least three times a week for longer than three months. What are the causes? Insomnia may be caused by another condition, situation, or substance, such as:  Anxiety.  Certain medicines.  Gastroesophageal reflux disease (GERD) or other gastrointestinal conditions.  Asthma or other breathing conditions.  Restless legs syndrome, sleep apnea, or other sleep disorders.  Chronic pain.  Menopause. This may include hot flashes.  Stroke.  Abuse of alcohol, tobacco, or illegal drugs.  Depression.  Caffeine.  Neurological disorders, such as Alzheimer disease.  An overactive thyroid (hyperthyroidism).  The cause of insomnia may not be known. What increases the risk? Risk factors for insomnia include:  Gender. Women are more commonly affected than men.  Age. Insomnia is more common as you get older.  Stress. This may involve your professional or personal life.  Income. Insomnia is more common in people with lower income.  Lack of exercise.  Irregular work schedule or night shifts.  Traveling between different time zones.  What are the signs or symptoms? If you have insomnia, trouble falling asleep or trouble staying asleep is the main symptom. This may lead to other symptoms, such as:  Feeling fatigued.  Feeling nervous about going to sleep.  Not feeling rested in the  morning.  Having trouble concentrating.  Feeling irritable, anxious, or depressed.  How is this treated? Treatment for insomnia depends on the cause. If your insomnia is caused by an underlying condition, treatment will focus on addressing the condition. Treatment may also include:  Medicines to help you sleep.  Counseling or therapy.  Lifestyle adjustments.  Follow these instructions at home:  Take medicines only as directed by your health care provider.  Keep regular sleeping and waking hours. Avoid naps.  Keep a sleep diary to help you and your health care provider figure out what could be causing your insomnia. Include: ? When you sleep. ? When you wake up during the night. ? How well you sleep. ? How rested you feel the next day. ? Any side effects of medicines you are taking. ? What you eat and drink.  Make your bedroom a comfortable place where it is easy to fall asleep: ? Put up shades or special blackout curtains to block light from outside. ? Use a white noise machine to block noise. ? Keep the temperature cool.  Exercise regularly as directed by your health care provider. Avoid exercising right before bedtime.  Use relaxation techniques to manage stress. Ask your health care provider to suggest some techniques that may work well for you. These may include: ? Breathing exercises. ? Routines to release muscle tension. ? Visualizing peaceful scenes.  Cut back on alcohol, caffeinated beverages, and cigarettes, especially close to bedtime. These can disrupt your sleep.  Do not overeat or eat spicy foods right before bedtime. This can lead to digestive discomfort that can make it hard for you to sleep.  Limit screen use before  bedtime. This includes: ? Watching TV. ? Using your smartphone, tablet, and computer.  Stick to a routine. This can help you fall asleep faster. Try to do a quiet activity, brush your teeth, and go to bed at the same time each night.  Get  out of bed if you are still awake after 15 minutes of trying to sleep. Keep the lights down, but try reading or doing a quiet activity. When you feel sleepy, go back to bed.  Make sure that you drive carefully. Avoid driving if you feel very sleepy.  Keep all follow-up appointments as directed by your health care provider. This is important. Contact a health care provider if:  You are tired throughout the day or have trouble in your daily routine due to sleepiness.  You continue to have sleep problems or your sleep problems get worse. Get help right away if:  You have serious thoughts about hurting yourself or someone else. This information is not intended to replace advice given to you by your health care provider. Make sure you discuss any questions you have with your health care provider. Document Released: 01/17/2000 Document Revised: 06/21/2015 Document Reviewed: 10/20/2013 Elsevier Interactive Patient Education  [employer].

## 2017-05-14 NOTE — Assessment & Plan Note (Signed)
Will try sildenafil 20 mg prn

## 2017-05-14 NOTE — Assessment & Plan Note (Signed)
Encouraged good sleep hygiene such as dark, quiet room. No blue/green glowing lights such as computer screens in bedroom. No alcohol or stimulants in evening. Cut down on caffeine as able. Regular exercise is helpful but not just prior to bed time.  

## 2017-05-14 NOTE — Assessment & Plan Note (Signed)
Well controlled, no changes to meds. Encouraged heart healthy diet such as the DASH diet and exercise as tolerated.  °

## 2017-05-17 LAB — PAIN MGMT, PROFILE 8 W/CONF, U
6 ACETYLMORPHINE: NEGATIVE ng/mL (ref <10)
ALCOHOL METABOLITES: POSITIVE ng/mL — AB (ref <500)
ALPHAHYDROXYALPRAZOLAM: NEGATIVE ng/mL (ref <25)
Alphahydroxymidazolam: NEGATIVE ng/mL (ref <50)
Alphahydroxytriazolam: NEGATIVE ng/mL (ref <50)
Aminoclonazepam: NEGATIVE ng/mL (ref <25)
Amphetamines: NEGATIVE ng/mL (ref <500)
BENZODIAZEPINES: NEGATIVE ng/mL (ref <100)
BUPRENORPHINE, URINE: NEGATIVE ng/mL (ref <5)
COCAINE METABOLITE: NEGATIVE ng/mL (ref <150)
CREATININE: 58.7 mg/dL
Ethyl Glucuronide (ETG): 9540 ng/mL — ABNORMAL HIGH (ref <500)
Ethyl Sulfate (ETS): 1954 ng/mL — ABNORMAL HIGH (ref <100)
HYDROXYETHYLFLURAZEPAM: NEGATIVE ng/mL (ref <50)
LORAZEPAM: NEGATIVE ng/mL (ref <50)
MARIJUANA METABOLITE: NEGATIVE ng/mL (ref <20)
MDMA: NEGATIVE ng/mL (ref <500)
NORDIAZEPAM: NEGATIVE ng/mL (ref <50)
OPIATES: NEGATIVE ng/mL (ref <100)
Oxazepam: NEGATIVE ng/mL (ref <50)
Oxidant: NEGATIVE ug/mL (ref <200)
Oxycodone: NEGATIVE ng/mL (ref <100)
PH: 6.55 (ref 4.5–9.0)
TEMAZEPAM: NEGATIVE ng/mL (ref <50)

## 2017-05-18 ENCOUNTER — Other Ambulatory Visit: Payer: Self-pay | Admitting: Family Medicine

## 2017-05-27 ENCOUNTER — Encounter: Payer: Self-pay | Admitting: Physician Assistant

## 2017-05-27 ENCOUNTER — Ambulatory Visit (INDEPENDENT_AMBULATORY_CARE_PROVIDER_SITE_OTHER): Payer: 59 | Admitting: Physician Assistant

## 2017-05-27 VITALS — BP 134/82 | HR 82 | Ht 70.0 in | Wt 208.6 lb

## 2017-05-27 DIAGNOSIS — E785 Hyperlipidemia, unspecified: Secondary | ICD-10-CM | POA: Diagnosis not present

## 2017-05-27 DIAGNOSIS — I10 Essential (primary) hypertension: Secondary | ICD-10-CM

## 2017-05-27 DIAGNOSIS — I2510 Atherosclerotic heart disease of native coronary artery without angina pectoris: Secondary | ICD-10-CM | POA: Diagnosis not present

## 2017-05-27 DIAGNOSIS — M79604 Pain in right leg: Secondary | ICD-10-CM

## 2017-05-27 DIAGNOSIS — M79605 Pain in left leg: Secondary | ICD-10-CM

## 2017-05-27 MED ORDER — EZETIMIBE 10 MG PO TABS: 10.0000 mg | ORAL_TABLET | Freq: Every day | ORAL | 3 refills | Status: DC

## 2017-05-27 NOTE — Progress Notes (Signed)
Cardiology Office Note    Date:  05/27/2017   ID:  Dakota [name], DOB [birth year], MRN [id number]  PCP:  [name], MD  Cardiologist:  Dr. Stanford Breed   Chief Complaint  Patient presents with  . Follow-up    seen for Dr. Stanford Breed. pt denies chest pains, swelling, SOB    History of Present Illness:  Dakota [name] is a 57 y.o. male with PMH of HTN, HLD, insomnia and CAD. 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 Cardiac cath showed normal left main, 30% proximal and mid LAD, 60% left circumflex, there were left-to-right collaterals to the RCA, RCA was dominant with 100% mid vessel occlusion after takeoff of a medium sized RV branch. There was also inferobasal wall akinesis, EF 50%. He underwent PCI of RCA with bare-metal stent. Stress echocardiogram in February 2013 was normal. 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  Carotid ultrasound obtained in June 2018 showed 1-39% disease bilaterally, recommend repeat study in 2 years.  I last saw the patient on 01/15/2016, he was doing well at the time.  He presents today for delayed cardiology office visit.  He has gained roughly 10 pounds since October 2018.  For some reason, he did not receive a letter from [place] to arrange cardiology office visit with Dr. Stanford Breed and he was told Dr. Jacalyn Lefevre appointment is booked up.  He also self discontinuing his aspirin.  I will restart aspirin 81 mg daily.  It is unclear to me why he is not on a beta-blocker.  His last lipid panel 6 months ago showed very well controlled with total cholesterol, triglyceride and HDL, his LDL is still not at goal.  I will add a low-dose Zetia to his medication list.  He will obtain fasting lipid panel and LFT at his PCPs office.  Otherwise, he denies any recent chest pain, shortness of breath, lower extremity edema, orthopnea or PND.  He is still has some occasional acid reflux symptoms however this is not  related to exertion and more related to food.  He does have occasional leg pain, however he is leg pain is not progressive with exertion.  It typically occurs at rest especially in the morning before he start moving.  He has great pulses in bilateral lower extremity, suspicion for PID is very low, I would not recommend any lower extremity ABI at this time.   Past Medical History:  Diagnosis Date  . Allergy    cats  . Anxiety   . Asthma    childhood  . CAD (coronary artery disease)   . Chicken pox as a child  . Depression 12/25/2012  . Dysphagia 12/16/2015  . H/O tobacco use, presenting hazards to health 02/11/2011   1/2 ppd encouraged Has tried nicotine replacements and Chantix in past   . Hyperlipidemia   . Hypertension   . Insomnia 06/11/2013  . 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 Kidney stone on left side 09/19/2013  . Left knee pain 12/25/2012  . Mumps as a child  . Onychomycosis 09/30/2014  . Overweight 12/16/2015  . Preventative health care 09/24/2013    Past Surgical History:  Procedure Laterality Date  . COLONOSCOPY    . CORONARY STENT PLACEMENT  01-14-05  . LITHOTRIPSY  2/16    Current Medications: Outpatient Medications Prior to Visit  Medication Sig Dispense Refill  . amLODipine (NORVASC) 5 MG tablet TAKE 1 TABLET DAILY 90 tablet  1  . aspirin EC 81 MG tablet Take 81 mg by mouth daily.    Marland Kitchen atorvastatin (LIPITOR) 80 MG tablet TAKE 1 TABLET DAILY 30 tablet 0  . buPROPion (WELLBUTRIN XL) 300 MG 24 hr tablet Take 1 tablet (300 mg total) by mouth daily. 90 tablet 1  . KRILL OIL PO Take 1 capsule by mouth daily.    . meloxicam (MOBIC) 15 MG tablet TAKE 1 TABLET DAILY 90 tablet 1  . PROBIOTIC PRODUCT PO Take 1 capsule by mouth daily. *Digestive Probiotic*    . sildenafil (REVATIO) 20 MG tablet Take 1-5 tablets (20-100 mg total) by mouth daily as needed. 35 tablet 1  . zolpidem (AMBIEN) 10 MG tablet Take 0.5-1 tablets (5-10 mg total) by mouth at bedtime as needed.  for sleep 30 tablet 5  . buPROPion (WELLBUTRIN XL) 300 MG 24 hr tablet TAKE 1 TABLET DAILY 90 tablet 1   No facility-administered medications prior to visit.      Allergies:   Neosporin [neomycin-bacitracin zn-polymyx] and Polysporin [bacitracin-polymyxin b]   Social History   Socioeconomic History  . Marital status: Legally Separated    Spouse name: Not on file  . Number of children: 1  . Years of education: Not on file  . Highest education level: Not on file  Occupational History  . Occupation: Clinical cytogeneticist  Social Needs  . Financial resource strain: Not on file  . Food insecurity:    Worry: Not on file    Inability: Not on file  . Transportation needs:    Medical: Not on file    Non-medical: Not on file  Tobacco Use  . Smoking status: Former Smoker    Packs/day: 0.50    Years: 20.00    Pack years: 10.00    Types: Cigarettes    Last attempt to quit: 02/20/2015    Years since quitting: 2.2  . Smokeless tobacco: Never Used  Substance and Sexual Activity  . Alcohol use: Yes    Comment: 10 drinks per week  . Drug use: No  . Sexual activity: Yes    Partners: Female    Comment: wife, real Investment banker, corporate, no dairy, minimal fried foods  Lifestyle  . Physical activity:    Days per week: Not on file    Minutes per session: Not on file  . Stress: Not on file  Relationships  . Social connections:    Talks on phone: Not on file    Gets together: Not on file    Attends religious service: Not on file    Active member of club or organization: Not on file    Attends meetings of clubs or organizations: Not on file    Relationship status: Not on file  Other Topics Concern  . Not on file  Social History Narrative   1 biological son   1 adopted child   3 step children     Family History:  The patient's family history includes Alcohol abuse in his father and sister; COPD in his sister; Cancer in his father; Esophageal cancer in his father; Heart disease in his  maternal grandfather; Multiple sclerosis in his mother; Seizures in his sister.   ROS:   Please see the history of present illness.    ROS All other systems reviewed and are negative.   PHYSICAL EXAM:   VS:  BP 134/82 (BP Location: Left Arm)   Pulse 82   Ht 5\' 10"  (1.778 m)   Wt 208 lb  9.6 oz (94.6 kg)   BMI 29.93 kg/m    GEN: Well nourished, well developed, in no acute distress  HEENT: normal  Neck: no JVD, carotid bruits, or masses Cardiac: RRR; no murmurs, rubs, or gallops,no edema  Respiratory:  clear to auscultation bilaterally, normal work of breathing GI: soft, nontender, nondistended, + BS MS: no deformity or atrophy  Skin: warm and dry, no rash Neuro:  Alert and Oriented x 3, Strength and sensation are intact Psych: euthymic mood, full affect  Wt Readings from Last 3 Encounters:  05/27/17 208 lb 9.6 oz (94.6 kg)  05/14/17 206 lb 12.8 oz (93.8 kg)  11/19/16 198 lb 9.6 oz (90.1 kg)      Studies/Labs Reviewed:   EKG:  EKG is ordered today.  The ekg ordered today demonstrates normal sinus rhythm with no significant ST-T wave changes.  Recent Labs: 11/19/2016: ALT 41; BUN 19; Creatinine, Ser 0.89; Potassium 4.6; Sodium 137; TSH 2.36   Lipid Panel    Component Value Date/Time   CHOL 156 11/19/2016 1612   TRIG 144.0 11/19/2016 1612   HDL 42.00 11/19/2016 1612   CHOLHDL 4 11/19/2016 1612   VLDL 28.8 11/19/2016 1612   LDLCALC 85 11/19/2016 1612   LDLDIRECT 149.4 09/13/2007 0936    Additional studies/ records that were reviewed today include:   Stress echo 03/09/2011 - Stress ECG conclusions: There were no stress arrhythmias or conduction abnormalities. The stress ECG was normal. - Staged echo: There was no echocardiographic evidence for stress-induced ischemia. Impressions:  - Technically difficult study; no chest pain and no ST changes; no stress-induced wall motion abnormalities; normal stress echocardiogram.    Carotid US  02/05/2014     ASSESSMENT:    1. Coronary artery disease involving native coronary artery of native heart without angina pectoris   2. Essential hypertension   3. Hyperlipidemia, unspecified hyperlipidemia type   4. Pain in both lower extremities      PLAN:  In order of problems listed above:  1. CAD: Patient has self discontinued aspirin, will restart.  He quit smoking 2 years ago, I congratulated him on this.  He is not on a beta-blocker for some reason.  We reviewed the previous note shows that he has not been beta-blocker since at least 2014.  He is on high-dose statin, however LDL still not at goal.  2. Hypertension: Blood pressure stable on 5 mg daily of amlodipine, although today's blood pressures mildly high, however recent blood pressure reading has been ranging in the 110s to 120s.  3. Hyperlipidemia: Continue Lipitor 80 mg daily, add a low-dose Zetia, obtain fasting lipid panel in a few month at PCPs office.  4. Leg pain: Patient has occasional bilateral leg pain, this occurs at rest and typically before he start moving in the morning.  He has great pulses in bilateral dorsalis pedis and posterior tibial.    Medication Adjustments/Labs and Tests Ordered: Current medicines are reviewed at length with the patient today.  Concerns regarding medicines are outlined above.  Medication changes, Labs and Tests ordered today are listed in the Patient Instructions below. Patient Instructions  Medication Instructions:  START: Zetia 10 mg daily RESTART: Aspirin 81 mg daily  If you need a refill on your cardiac medications before your next appointment, please call your pharmacy.  Labwork: None ordered  Testing/Procedures: None ordered   Follow-Up: Your physician wants you to follow-up in: 6-9 months with Dr.Crenshaw. You should receive a reminder letter in the mail two months  in advance. If you do not receive a letter, please call our office [phone].   Thank you for  choosing [facility] at [employer], [place]  05/27/2017 9:35 AM    [address], [place], [place]  [address] Phone: [phone]; Fax: [phone]

## 2017-05-27 NOTE — Patient Instructions (Signed)
Medication Instructions:  START: Zetia 10 mg daily RESTART: Aspirin 81 mg daily  If you need a refill on your cardiac medications before your next appointment, please call your pharmacy.  Labwork: None ordered  Testing/Procedures: None ordered   Follow-Up: Your physician wants you to follow-up in: 6-9 months with Dr.Crenshaw. You should receive a reminder letter in the mail two months in advance. If you do not receive a letter, please call our office [phone].   Thank you for choosing [facility] at [facility]!!

## 2017-06-13 ENCOUNTER — Other Ambulatory Visit: Payer: Self-pay | Admitting: Cardiology

## 2017-06-14 NOTE — Telephone Encounter (Signed)
Rx request sent to pharmacy.  

## 2017-07-02 ENCOUNTER — Other Ambulatory Visit: Payer: Self-pay | Admitting: *Deleted

## 2017-07-02 MED ORDER — ATORVASTATIN CALCIUM 80 MG PO TABS: 80.0000 mg | ORAL_TABLET | Freq: Every day | ORAL | 2 refills | Status: DC

## 2017-08-21 ENCOUNTER — Other Ambulatory Visit: Payer: Self-pay | Admitting: Cardiology

## 2017-11-03 ENCOUNTER — Other Ambulatory Visit: Payer: Self-pay | Admitting: Family Medicine

## 2017-11-09 ENCOUNTER — Other Ambulatory Visit: Payer: Self-pay | Admitting: Family Medicine

## 2017-11-11 NOTE — Telephone Encounter (Signed)
Pt is requesting refill on Ambien.   Last OV; 05/14/2017 Last Fill: 05/14/2017 #30 and 5rf UDS: 05/14/2017 Low risk

## 2017-11-22 ENCOUNTER — Other Ambulatory Visit (INDEPENDENT_AMBULATORY_CARE_PROVIDER_SITE_OTHER): Payer: Managed Care, Other (non HMO)

## 2017-11-22 DIAGNOSIS — R739 Hyperglycemia, unspecified: Secondary | ICD-10-CM | POA: Diagnosis not present

## 2017-11-22 DIAGNOSIS — Z0000 Encounter for general adult medical examination without abnormal findings: Secondary | ICD-10-CM

## 2017-11-22 DIAGNOSIS — E782 Mixed hyperlipidemia: Secondary | ICD-10-CM | POA: Diagnosis not present

## 2017-11-22 DIAGNOSIS — I10 Essential (primary) hypertension: Secondary | ICD-10-CM

## 2017-11-22 DIAGNOSIS — N529 Male erectile dysfunction, unspecified: Secondary | ICD-10-CM | POA: Diagnosis not present

## 2017-11-22 LAB — LIPID PANEL
CHOL/HDL RATIO: 3
CHOLESTEROL: 89 mg/dL (ref 0–200)
HDL: 31.5 mg/dL — AB (ref >39.00)
LDL CALC: 43 mg/dL (ref 0–99)
NonHDL: 57.61
TRIGLYCERIDES: 74 mg/dL (ref 0.0–149.0)
VLDL: 14.8 mg/dL (ref 0.0–40.0)

## 2017-11-22 LAB — COMPREHENSIVE METABOLIC PANEL
ALBUMIN: 4.2 g/dL (ref 3.5–5.2)
ALT: 28 U/L (ref 0–53)
AST: 23 U/L (ref 0–37)
Alkaline Phosphatase: 79 U/L (ref 39–117)
BILIRUBIN TOTAL: 0.5 mg/dL (ref 0.2–1.2)
BUN: 17 mg/dL (ref 6–23)
CALCIUM: 9.4 mg/dL (ref 8.4–10.5)
CHLORIDE: 105 meq/L (ref 96–112)
CO2: 27 meq/L (ref 19–32)
Creatinine, Ser: 0.94 mg/dL (ref 0.40–1.50)
GFR: 87.74 mL/min (ref >60.00)
Glucose, Bld: 98 mg/dL (ref 70–99)
Potassium: 4.5 mEq/L (ref 3.5–5.1)
Sodium: 140 mEq/L (ref 135–145)
Total Protein: 6.9 g/dL (ref 6.0–8.3)

## 2017-11-22 LAB — TSH: TSH: 2.86 u[IU]/mL (ref 0.35–4.50)

## 2017-11-22 LAB — CBC
HCT: 37.5 % — ABNORMAL LOW (ref 39.0–52.0)
Hemoglobin: 12.5 g/dL — ABNORMAL LOW (ref 13.0–17.0)
MCHC: 33.3 g/dL (ref 30.0–36.0)
MCV: 90.1 fl (ref 78.0–100.0)
Platelets: 253 10*3/uL (ref 150.0–400.0)
RBC: 4.16 Mil/uL — AB (ref 4.22–5.81)
RDW: 14.2 % (ref 11.5–15.5)
WBC: 6 10*3/uL (ref 4.0–10.5)

## 2017-11-22 LAB — HEMOGLOBIN A1C: HEMOGLOBIN A1C: 6 % (ref 4.6–6.5)

## 2017-11-22 LAB — PSA: PSA: 0.65 ng/mL (ref 0.10–4.00)

## 2017-11-26 ENCOUNTER — Encounter: Payer: Self-pay | Admitting: Family Medicine

## 2017-11-26 ENCOUNTER — Ambulatory Visit (INDEPENDENT_AMBULATORY_CARE_PROVIDER_SITE_OTHER): Payer: Managed Care, Other (non HMO) | Admitting: Family Medicine

## 2017-11-26 VITALS — BP 100/68 | HR 71 | Temp 97.6°F | Resp 18 | Ht 72.0 in | Wt 208.2 lb

## 2017-11-26 DIAGNOSIS — I10 Essential (primary) hypertension: Secondary | ICD-10-CM

## 2017-11-26 DIAGNOSIS — D649 Anemia, unspecified: Secondary | ICD-10-CM

## 2017-11-26 DIAGNOSIS — Z0000 Encounter for general adult medical examination without abnormal findings: Secondary | ICD-10-CM | POA: Diagnosis not present

## 2017-11-26 DIAGNOSIS — Z79899 Other long term (current) drug therapy: Secondary | ICD-10-CM | POA: Diagnosis not present

## 2017-11-26 DIAGNOSIS — R739 Hyperglycemia, unspecified: Secondary | ICD-10-CM | POA: Diagnosis not present

## 2017-11-26 DIAGNOSIS — E782 Mixed hyperlipidemia: Secondary | ICD-10-CM

## 2017-11-26 DIAGNOSIS — G250 Essential tremor: Secondary | ICD-10-CM | POA: Insufficient documentation

## 2017-11-26 DIAGNOSIS — Z23 Encounter for immunization: Secondary | ICD-10-CM | POA: Diagnosis not present

## 2017-11-26 MED ORDER — ATORVASTATIN CALCIUM 80 MG PO TABS: 80.0000 mg | ORAL_TABLET | Freq: Every day | ORAL | 2 refills | Status: DC

## 2017-11-26 MED ORDER — SILDENAFIL CITRATE 20 MG PO TABS: 20.0000 mg | ORAL_TABLET | Freq: Every day | ORAL | 1 refills | Status: DC | PRN

## 2017-11-26 MED ORDER — BUPROPION HCL ER (XL) 300 MG PO TB24: 300.0000 mg | ORAL_TABLET | Freq: Every day | ORAL | 1 refills | Status: DC

## 2017-11-26 MED ORDER — AMLODIPINE BESYLATE 5 MG PO TABS: 5.0000 mg | ORAL_TABLET | Freq: Every day | ORAL | 2 refills | Status: DC

## 2017-11-26 MED ORDER — METOPROLOL SUCCINATE ER 50 MG PO TB24: 50.0000 mg | ORAL_TABLET | Freq: Every day | ORAL | 3 refills | Status: DC

## 2017-11-26 MED ORDER — ZOLPIDEM TARTRATE 10 MG PO TABS: ORAL_TABLET | ORAL | 0 refills | Status: DC

## 2017-11-26 NOTE — Assessment & Plan Note (Signed)
Encouraged heart healthy diet, increase exercise, avoid trans fats, consider a krill oil cap daily 

## 2017-11-26 NOTE — Patient Instructions (Addendum)
Check your multivitamin and make sure it has iron in it if not then take an over the counter iron supplement. For a week after donating blood take one.   Shingrix is the new shingles 2 shots over 2-6 months call insurance and confirm cverage document and then call for nurse appt for the shot or you can get at pharmacy. Preventive Care 40-64 Years, Male Preventive care refers to lifestyle choices and visits with your health care provider that can promote health and wellness. What does preventive care include?  A yearly physical exam. This is also called an annual well check.  Dental exams once or twice a year.  Routine eye exams. Ask your health care provider how often you should have your eyes checked.  Personal lifestyle choices, including: ? Daily care of your teeth and gums. ? Regular physical activity. ? Eating a healthy diet. ? Avoiding tobacco and drug use. ? Limiting alcohol use. ? Practicing safe sex. ? Taking low-dose aspirin every day starting at age 50. What happens during an annual well check? The services and screenings done by your health care provider during your annual well check will depend on your age, overall health, lifestyle risk factors, and family history of disease. Counseling Your health care provider may ask you questions about your:  Alcohol use.  Tobacco use.  Drug use.  Emotional well-being.  Home and relationship well-being.  Sexual activity.  Eating habits.  Work and work environment.  Screening You may have the following tests or measurements:  Height, weight, and BMI.  Blood pressure.  Lipid and cholesterol levels. These may be checked every 5 years, or more frequently if you are over 50 years old.  Skin check.  Lung cancer screening. You may have this screening every year starting at age 55 if you have a 30-pack-year history of smoking and currently smoke or have quit within the past 15 years.  Fecal occult blood test (FOBT) of  the stool. You may have this test every year starting at age 50.  Flexible sigmoidoscopy or colonoscopy. You may have a sigmoidoscopy every 5 years or a colonoscopy every 10 years starting at age 50.  Prostate cancer screening. Recommendations will vary depending on your family history and other risks.  Hepatitis C blood test.  Hepatitis B blood test.  Sexually transmitted disease (STD) testing.  Diabetes screening. This is done by checking your blood sugar (glucose) after you have not eaten for a while (fasting). You may have this done every 1-3 years.  Discuss your test results, treatment options, and if necessary, the need for more tests with your health care provider. Vaccines Your health care provider may recommend certain vaccines, such as:  Influenza vaccine. This is recommended every year.  Tetanus, diphtheria, and acellular pertussis (Tdap, Td) vaccine. You may need a Td booster every 10 years.  Varicella vaccine. You may need this if you have not been vaccinated.  Zoster vaccine. You may need this after age 60.  Measles, mumps, and rubella (MMR) vaccine. You may need at least one dose of MMR if you were born in 1957 or later. You may also need a second dose.  Pneumococcal 13-valent conjugate (PCV13) vaccine. You may need this if you have certain conditions and have not been vaccinated.  Pneumococcal polysaccharide (PPSV23) vaccine. You may need one or two doses if you smoke cigarettes or if you have certain conditions.  Meningococcal vaccine. You may need this if you have certain conditions.  Hepatitis A   vaccine. You may need this if you have certain conditions or if you travel or work in places where you may be exposed to hepatitis A.  Hepatitis B vaccine. You may need this if you have certain conditions or if you travel or work in places where you may be exposed to hepatitis B.  Haemophilus influenzae type b (Hib) vaccine. You may need this if you have certain risk  factors.  Talk to your health care provider about which screenings and vaccines you need and how often you need them. This information is not intended to replace advice given to you by your health care provider. Make sure you discuss any questions you have with your health care provider. Document Released: 02/15/2015 Document Revised: 10/09/2015 Document Reviewed: 11/20/2014 Elsevier Interactive Patient Education  [employer].

## 2017-11-26 NOTE — Assessment & Plan Note (Addendum)
Patient encouraged to maintain heart healthy diet, regular exercise, adequate sleep. Consider daily probiotics. Take medications as prescribed. Given and reviewed copy of ACP documents from [employer] and encouraged to complete and return. Offered Shingrix will call insurance regarding coverage

## 2017-11-26 NOTE — Assessment & Plan Note (Addendum)
Has had it for years but he notes it gets worse when he is anxious. His son and father also experience the same. Will switch him from Amlodipine to Metoprolol Succinate 50 mg daily and recheck vitals and symptoms in one month.

## 2017-11-26 NOTE — Assessment & Plan Note (Signed)
hgba1c acceptable, minimize simple carbs. Increase exercise as tolerated.  

## 2017-11-26 NOTE — Progress Notes (Signed)
Subjective:    Patient ID: Dakota [name], male    DOB: 18-May-1960, 57 y.o.   MRN: [id number]  No chief complaint on file.   HPI Patient is in today for annual preventative exam and follow up on chronic medical concerns including hypertension, hyperlipidemia and insomnia. The Ambien works well for his insomnia and he denies any acute concerns of side effects with the medication. He has an ongoing head tremor for years as does his son and father but notes it is getting worse when he is anxious. No other neurologic complaints. He exercises regularly and eats a heart healthy diet. Manages his activities of daily living well. No polyuria or polydipsia. Denies CP/palp/SOB/HA/congestion/fevers/GI or GU c/o. Taking meds as prescribed  Past Medical History:  Diagnosis Date  . Allergy    cats  . Anxiety   . Asthma    childhood  . CAD (coronary artery disease)   . Chicken pox as a child  . Depression 12/25/2012  . Dysphagia 12/16/2015  . H/O tobacco use, presenting hazards to health 02/11/2011   1/2 ppd encouraged Has tried nicotine replacements and Chantix in past   . Hyperlipidemia   . Hypertension   . Insomnia 06/11/2013  . 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 Kidney stone on left side 09/19/2013  . Left knee pain 12/25/2012  . Mumps as a child  . Onychomycosis 09/30/2014  . Overweight 12/16/2015  . Preventative health care 09/24/2013    Past Surgical History:  Procedure Laterality Date  . COLONOSCOPY    . CORONARY STENT PLACEMENT  01-14-05  . LITHOTRIPSY  2/16    Family History  Problem Relation Age of Onset  . Multiple sclerosis Mother   . Cancer Father        esophagus/ brain  . Alcohol abuse Father        smoker  . Esophageal cancer Father   . Seizures Sister        AVM  . COPD Sister   . Alcohol abuse Sister        addiction, recovered  . Heart disease Maternal Grandfather        MI  . Cancer Paternal Aunt        leukemia  . Colon cancer Neg Hx   . Colon  polyps Neg Hx   . Rectal cancer Neg Hx   . Stomach cancer Neg Hx     Social History   Socioeconomic History  . Marital status: Legally Separated    Spouse name: Not on file  . Number of children: 1  . Years of education: Not on file  . Highest education level: Not on file  Occupational History  . Occupation: Clinical cytogeneticist  Social Needs  . Financial resource strain: Not on file  . Food insecurity:    Worry: Not on file    Inability: Not on file  . Transportation needs:    Medical: Not on file    Non-medical: Not on file  Tobacco Use  . Smoking status: Former Smoker    Packs/day: 0.50    Years: 20.00    Pack years: 10.00    Types: Cigarettes    Last attempt to quit: 02/20/2015    Years since quitting: 2.7  . Smokeless tobacco: Never Used  Substance and Sexual Activity  . Alcohol use: Yes    Comment: 10 drinks per week  . Drug use: No  . Sexual activity: Yes    Partners: Female  Comment: wife, real Investment banker, corporate, no dairy, minimal fried foods  Lifestyle  . Physical activity:    Days per week: Not on file    Minutes per session: Not on file  . Stress: Not on file  Relationships  . Social connections:    Talks on phone: Not on file    Gets together: Not on file    Attends religious service: Not on file    Active member of club or organization: Not on file    Attends meetings of clubs or organizations: Not on file    Relationship status: Not on file  . Intimate partner violence:    Fear of current or ex partner: Not on file    Emotionally abused: Not on file    Physically abused: Not on file    Forced sexual activity: Not on file  Other Topics Concern  . Not on file  Social History Narrative   1 biological son   1 adopted child   3 step children    Outpatient Medications Prior to Visit  Medication Sig Dispense Refill  . aspirin EC 81 MG tablet Take 81 mg by mouth daily.    Marland Kitchen KRILL OIL PO Take 1 capsule by mouth daily.    . meloxicam (MOBIC)  15 MG tablet TAKE 1 TABLET DAILY 90 tablet 4  . PROBIOTIC PRODUCT PO Take 1 capsule by mouth daily. *Digestive Probiotic*    . amLODipine (NORVASC) 5 MG tablet TAKE 1 TABLET DAILY 90 tablet 2  . atorvastatin (LIPITOR) 80 MG tablet Take 1 tablet (80 mg total) by mouth daily at 6 PM. 90 tablet 2  . buPROPion (WELLBUTRIN XL) 300 MG 24 hr tablet Take 1 tablet (300 mg total) by mouth daily. 90 tablet 1  . sildenafil (REVATIO) 20 MG tablet Take 1-5 tablets (20-100 mg total) by mouth daily as needed. 35 tablet 1  . zolpidem (AMBIEN) 10 MG tablet TAKE ONE-HALF TO ONE TABLET BY MOUTH EVERY NIGHT AT BEDTIME FOR SLEEP 30 tablet 0  . ezetimibe (ZETIA) 10 MG tablet Take 1 tablet (10 mg total) by mouth daily. 90 tablet 3   No facility-administered medications prior to visit.     Allergies  Allergen Reactions  . Neosporin [Neomycin-Bacitracin Zn-Polymyx] Hives and Itching  . Polysporin [Bacitracin-Polymyxin B] Hives and Itching    Review of Systems  Constitutional: Negative for chills, fever and malaise/fatigue.  HENT: Negative for congestion and hearing loss.   Eyes: Negative for discharge.  Respiratory: Negative for cough, sputum production and shortness of breath.   Cardiovascular: Negative for chest pain, palpitations and leg swelling.  Gastrointestinal: Negative for abdominal pain, blood in stool, constipation, diarrhea, heartburn, nausea and vomiting.  Genitourinary: Negative for dysuria, frequency, hematuria and urgency.  Musculoskeletal: Negative for back pain, falls and myalgias.  Skin: Negative for rash.  Neurological: Negative for dizziness, sensory change, loss of consciousness, weakness and headaches.  Endo/Heme/Allergies: Negative for environmental allergies. Does not bruise/bleed easily.  Psychiatric/Behavioral: Negative for depression and suicidal ideas. The patient is not nervous/anxious and does not have insomnia.        Objective:    Physical Exam  Constitutional: He is  oriented to person, place, and time. He appears well-developed and well-nourished. No distress.  HENT:  Head: Normocephalic and atraumatic.  Nose: Nose normal.  Eyes: Right eye exhibits no discharge. Left eye exhibits no discharge.  Neck: Normal range of motion. Neck supple.  Cardiovascular: Normal rate and regular rhythm.  No murmur heard.  Pulmonary/Chest: Effort normal and breath sounds normal.  Abdominal: Soft. Bowel sounds are normal. There is no tenderness.  Musculoskeletal: He exhibits no edema.  Neurological: He is alert and oriented to person, place, and time.  Skin: Skin is warm and dry.  Psychiatric: He has a normal mood and affect.  Nursing note and vitals reviewed.   BP 100/68 (BP Location: Left Arm, Patient Position: Sitting, Cuff Size: Normal)   Pulse 71   Temp 97.6 F (36.4 C) (Oral)   Resp 18   Ht 6' (1.829 m)   Wt 208 lb 3.2 oz (94.4 kg)   SpO2 97%   BMI 28.24 kg/m  Wt Readings from Last 3 Encounters:  11/26/17 208 lb 3.2 oz (94.4 kg)  05/27/17 208 lb 9.6 oz (94.6 kg)  05/14/17 206 lb 12.8 oz (93.8 kg)     Lab Results  Component Value Date   WBC 6.0 11/22/2017   HGB 12.5 (L) 11/22/2017   HCT 37.5 (L) 11/22/2017   PLT 253.0 11/22/2017   GLUCOSE 98 11/22/2017   CHOL 89 11/22/2017   TRIG 74.0 11/22/2017   HDL 31.50 (L) 11/22/2017   LDLDIRECT 149.4 09/13/2007   LDLCALC 43 11/22/2017   ALT 28 11/22/2017   AST 23 11/22/2017   NA 140 11/22/2017   K 4.5 11/22/2017   CL 105 11/22/2017   CREATININE 0.94 11/22/2017   BUN 17 11/22/2017   CO2 27 11/22/2017   TSH 2.86 11/22/2017   PSA 0.65 11/22/2017   HGBA1C 6.0 11/22/2017    Lab Results  Component Value Date   TSH 2.86 11/22/2017   Lab Results  Component Value Date   WBC 6.0 11/22/2017   HGB 12.5 (L) 11/22/2017   HCT 37.5 (L) 11/22/2017   MCV 90.1 11/22/2017   PLT 253.0 11/22/2017   Lab Results  Component Value Date   NA 140 11/22/2017   K 4.5 11/22/2017   CO2 27 11/22/2017   GLUCOSE 98  11/22/2017   BUN 17 11/22/2017   CREATININE 0.94 11/22/2017   BILITOT 0.5 11/22/2017   ALKPHOS 79 11/22/2017   AST 23 11/22/2017   ALT 28 11/22/2017   PROT 6.9 11/22/2017   ALBUMIN 4.2 11/22/2017   CALCIUM 9.4 11/22/2017   ANIONGAP 8 03/11/2014   GFR 87.74 11/22/2017   Lab Results  Component Value Date   CHOL 89 11/22/2017   Lab Results  Component Value Date   HDL 31.50 (L) 11/22/2017   Lab Results  Component Value Date   LDLCALC 43 11/22/2017   Lab Results  Component Value Date   TRIG 74.0 11/22/2017   Lab Results  Component Value Date   CHOLHDL 3 11/22/2017   Lab Results  Component Value Date   HGBA1C 6.0 11/22/2017       Assessment & Plan:   Problem List Items Addressed This Visit    Hyperlipidemia, mixed    Encouraged heart healthy diet, increase exercise, avoid trans fats, consider a krill oil cap daily      Relevant Medications   atorvastatin (LIPITOR) 80 MG tablet   sildenafil (REVATIO) 20 MG tablet   metoprolol succinate (TOPROL-XL) 50 MG 24 hr tablet   Other Relevant Orders   Lipid panel   TSH   Essential hypertension    Well controlled, no changes to meds. Encouraged heart healthy diet such as the DASH diet and exercise as tolerated.       Relevant Medications   atorvastatin (LIPITOR) 80 MG tablet   sildenafil (  REVATIO) 20 MG tablet   metoprolol succinate (TOPROL-XL) 50 MG 24 hr tablet   Other Relevant Orders   TSH   Preventative health care    Patient encouraged to maintain heart healthy diet, regular exercise, adequate sleep. Consider daily probiotics. Take medications as prescribed. Given and reviewed copy of ACP documents from [employer] and encouraged to complete and return. Offered [employer] will call insurance regarding coverage      Hyperglycemia    hgba1c acceptable, minimize simple carbs. Increase exercise as tolerated.       Relevant Orders   Comprehensive metabolic panel   TSH   Hemoglobin A1c   Benign head  tremor    Has had it for years but he notes it gets worse when he is anxious. His son and father also experience the same. Will switch him from Amlodipine to Metoprolol Succinate 50 mg daily and recheck vitals and symptoms in one month.       Other Visit Diagnoses    High risk medication use    -  Primary   Relevant Orders   Pain Mgmt, Profile 8 w/Conf, U   Pain Mgmt, Profile 8 w/Conf, U   Anemia, unspecified type       Relevant Orders   CBC   TSH      I have discontinued Yashar A. Kershaw's amLODipine and amLODipine. I am also having him start on metoprolol succinate. Additionally, I am having him maintain his KRILL OIL PO, PROBIOTIC PRODUCT PO, aspirin EC, ezetimibe, meloxicam, buPROPion, atorvastatin, zolpidem, and sildenafil.  Meds ordered this encounter  Medications  . buPROPion (WELLBUTRIN XL) 300 MG 24 hr tablet    Sig: Take 1 tablet (300 mg total) by mouth daily.    Dispense:  90 tablet    Refill:  1  . DISCONTD: amLODipine (NORVASC) 5 MG tablet    Sig: Take 1 tablet (5 mg total) by mouth daily.    Dispense:  90 tablet    Refill:  2  . atorvastatin (LIPITOR) 80 MG tablet    Sig: Take 1 tablet (80 mg total) by mouth daily at 6 PM.    Dispense:  90 tablet    Refill:  2  . zolpidem (AMBIEN) 10 MG tablet    Sig: TAKE ONE-HALF TO ONE TABLET BY MOUTH EVERY NIGHT AT BEDTIME FOR SLEEP    Dispense:  30 tablet    Refill:  0  . sildenafil (REVATIO) 20 MG tablet    Sig: Take 1-5 tablets (20-100 mg total) by mouth daily as needed.    Dispense:  35 tablet    Refill:  1  . metoprolol succinate (TOPROL-XL) 50 MG 24 hr tablet    Sig: Take 1 tablet (50 mg total) by mouth daily. Take with or immediately following a meal.    Dispense:  30 tablet    Refill:  3     Penni Homans, MD

## 2017-11-26 NOTE — Assessment & Plan Note (Signed)
Well controlled, no changes to meds. Encouraged heart healthy diet such as the DASH diet and exercise as tolerated.  °

## 2017-11-29 ENCOUNTER — Other Ambulatory Visit: Payer: Self-pay | Admitting: Family Medicine

## 2017-11-29 LAB — PAIN MGMT, PROFILE 8 W/CONF, U
6 Acetylmorphine: NEGATIVE ng/mL (ref <10)
ALPHAHYDROXYMIDAZOLAM: NEGATIVE ng/mL (ref <50)
ALPHAHYDROXYTRIAZOLAM: NEGATIVE ng/mL (ref <50)
Alcohol Metabolites: NEGATIVE ng/mL (ref <500)
Alphahydroxyalprazolam: 34 ng/mL — ABNORMAL HIGH (ref <25)
Aminoclonazepam: NEGATIVE ng/mL (ref <25)
Amphetamines: NEGATIVE ng/mL (ref <500)
BENZODIAZEPINES: POSITIVE ng/mL — AB (ref <100)
Buprenorphine, Urine: NEGATIVE ng/mL (ref <5)
Cocaine Metabolite: NEGATIVE ng/mL (ref <150)
Creatinine: 81.8 mg/dL
HYDROXYETHYLFLURAZEPAM: NEGATIVE ng/mL (ref <50)
LORAZEPAM: NEGATIVE ng/mL (ref <50)
MARIJUANA METABOLITE: NEGATIVE ng/mL (ref <20)
MDMA: NEGATIVE ng/mL (ref <500)
Nordiazepam: NEGATIVE ng/mL (ref <50)
OXAZEPAM: NEGATIVE ng/mL (ref <50)
OXYCODONE: NEGATIVE ng/mL (ref <100)
Opiates: NEGATIVE ng/mL (ref <100)
Oxidant: NEGATIVE ug/mL (ref <200)
TEMAZEPAM: NEGATIVE ng/mL (ref <50)
pH: 7.2 (ref 4.5–9.0)

## 2018-01-09 ENCOUNTER — Other Ambulatory Visit: Payer: Self-pay | Admitting: Family Medicine

## 2018-01-10 ENCOUNTER — Encounter: Payer: Self-pay | Admitting: Family Medicine

## 2018-01-10 NOTE — Telephone Encounter (Signed)
Requesting:ambien  Contract:yes UDS:low risk next screen 05/28/18 Last OV:11/26/17 Next OV:06/02/18 Last Refill:11/26/17 #[id number] Database:   Please advise

## 2018-01-11 ENCOUNTER — Other Ambulatory Visit: Payer: Self-pay | Admitting: Family Medicine

## 2018-01-11 MED ORDER — ZOLPIDEM TARTRATE 10 MG PO TABS: ORAL_TABLET | ORAL | 4 refills | Status: DC

## 2018-02-28 NOTE — Progress Notes (Signed)
HPI: FU coronary artery disease. 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 Cath revealed normal LM. Left anterior descending artery had 30% lesion in the proximal and mid-vessel. Lcx with 60 % lesion. There were left-to-right collaterals to the RCA. Right coronary artery was dominant and was 100% midvessel occlusion after the takeoff of a medium-sized RV branch; inferobasal wall akinesis. EF was 50%. He underwent PCI of the right coronary artery with bare-metal stent. EF was 50%. Stress echocardiogram in February 2013 was normal. Carotid Dopplers June 2018 showed 1 to 39% bilateral stenosis.  Since last seen, the patient has dyspnea with more extreme activities but not with routine activities. It is relieved with rest. It is not associated with chest pain. There is no orthopnea, PND or pedal edema. There is no syncope or palpitations. There is no exertional chest pain.   Current Outpatient Medications  Medication Sig Dispense Refill  . aspirin EC 81 MG tablet Take 81 mg by mouth daily.    Marland Kitchen atorvastatin (LIPITOR) 80 MG tablet Take 1 tablet (80 mg total) by mouth daily at 6 PM. 90 tablet 2  . buPROPion (WELLBUTRIN XL) 300 MG 24 hr tablet Take 1 tablet (300 mg total) by mouth daily. 90 tablet 1  . ezetimibe (ZETIA) 10 MG tablet Take 1 tablet (10 mg total) by mouth daily. 90 tablet 3  . KRILL OIL PO Take 1 capsule by mouth daily.    . meloxicam (MOBIC) 15 MG tablet TAKE 1 TABLET DAILY 90 tablet 4  . metoprolol succinate (TOPROL-XL) 50 MG 24 hr tablet Take 1 tablet (50 mg total) by mouth daily. Take with or immediately following a meal. 30 tablet 3  . PROBIOTIC PRODUCT PO Take 1 capsule by mouth daily. *Digestive Probiotic*    . sildenafil (REVATIO) 20 MG tablet Take 1-5 tablets (20-100 mg total) by mouth daily as needed. 35 tablet 1  . zolpidem (AMBIEN) 10 MG tablet TAKE 1/2 TO 1 TABLET BY MOUTH AT BEDTIME AS NEEDED FOR SLEEP 30 tablet 4   No current facility-administered  medications for this visit.      Past Medical History:  Diagnosis Date  . Allergy    cats  . Anxiety   . Asthma    childhood  . CAD (coronary artery disease)   . Chicken pox as a child  . Depression 12/25/2012  . Dysphagia 12/16/2015  . H/O tobacco use, presenting hazards to health 02/11/2011   1/2 ppd encouraged Has tried nicotine replacements and Chantix in past   . Hyperlipidemia   . Hypertension   . Insomnia 06/11/2013  . 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 Kidney stone on left side 09/19/2013  . Left knee pain 12/25/2012  . Mumps as a child  . Onychomycosis 09/30/2014  . Overweight 12/16/2015  . Preventative health care 09/24/2013    Past Surgical History:  Procedure Laterality Date  . COLONOSCOPY    . CORONARY STENT PLACEMENT  01-14-05  . LITHOTRIPSY  2/16    Social History   Socioeconomic History  . Marital status: Legally Separated    Spouse name: Not on file  . Number of children: 1  . Years of education: Not on file  . Highest education level: Not on file  Occupational History  . Occupation: Clinical cytogeneticist  Social Needs  . Financial resource strain: Not on file  . Food insecurity:    Worry: Not on file    Inability: Not on  file  . Transportation needs:    Medical: Not on file    Non-medical: Not on file  Tobacco Use  . Smoking status: Former Smoker    Packs/day: 0.50    Years: 20.00    Pack years: 10.00    Types: Cigarettes    Last attempt to quit: 02/20/2015    Years since quitting: 3.0  . Smokeless tobacco: Never Used  Substance and Sexual Activity  . Alcohol use: Yes    Comment: 10 drinks per week  . Drug use: No  . Sexual activity: Yes    Partners: Female    Comment: wife, real Investment banker, corporate, no dairy, minimal fried foods  Lifestyle  . Physical activity:    Days per week: Not on file    Minutes per session: Not on file  . Stress: Not on file  Relationships  . Social connections:    Talks on phone: Not on file     Gets together: Not on file    Attends religious service: Not on file    Active member of club or organization: Not on file    Attends meetings of clubs or organizations: Not on file    Relationship status: Not on file  . Intimate partner violence:    Fear of current or ex partner: Not on file    Emotionally abused: Not on file    Physically abused: Not on file    Forced sexual activity: Not on file  Other Topics Concern  . Not on file  Social History Narrative   1 biological son   1 adopted child   3 step children    Family History  Problem Relation Age of Onset  . Multiple sclerosis Mother   . Cancer Father        esophagus/ brain  . Alcohol abuse Father        smoker  . Esophageal cancer Father   . Seizures Sister        AVM  . COPD Sister   . Alcohol abuse Sister        addiction, recovered  . Heart disease Maternal Grandfather        MI  . Cancer Paternal Aunt        leukemia  . Colon cancer Neg Hx   . Colon polyps Neg Hx   . Rectal cancer Neg Hx   . Stomach cancer Neg Hx     ROS: no fevers or chills, productive cough, hemoptysis, dysphasia, odynophagia, melena, hematochezia, dysuria, hematuria, rash, seizure activity, orthopnea, PND, pedal edema, claudication. Remaining systems are negative.  Physical Exam: Well-developed well-nourished in no acute distress.  Skin is warm and dry.  HEENT is normal.  Neck is supple.  Chest is clear to auscultation with normal expansion.  Cardiovascular exam is regular rate and rhythm.  Abdominal exam nontender or distended. No masses palpated. Extremities show no edema. neuro grossly intact  ECG-sinus bradycardia at a rate of 59, left atrial enlargement, cannot rule out prior inferior infarct.  Personally reviewed  A/P  1 coronary artery disease-patient denies chest pain.  Plan to continue medical therapy with aspirin and statin.  2 carotid artery disease-mild on most recent Dopplers.  Continue medical therapy.  3  hyperlipidemia-continue statin.  4 hypertension-patient's blood pressure is controlled.  Continue present medications and follow.  Kirk Ruths, MD

## 2018-03-09 ENCOUNTER — Ambulatory Visit: Payer: Managed Care, Other (non HMO) | Admitting: Cardiology

## 2018-03-09 ENCOUNTER — Encounter: Payer: Self-pay | Admitting: Cardiology

## 2018-03-09 VITALS — BP 126/68 | HR 59 | Ht 72.0 in | Wt 207.4 lb

## 2018-03-09 DIAGNOSIS — E7800 Pure hypercholesterolemia, unspecified: Secondary | ICD-10-CM | POA: Diagnosis not present

## 2018-03-09 DIAGNOSIS — I2510 Atherosclerotic heart disease of native coronary artery without angina pectoris: Secondary | ICD-10-CM | POA: Diagnosis not present

## 2018-03-09 DIAGNOSIS — I10 Essential (primary) hypertension: Secondary | ICD-10-CM | POA: Diagnosis not present

## 2018-03-09 NOTE — Patient Instructions (Addendum)
Medication Instructions Your physician recommends that you continue on your current medications as directed. Please refer to the Current Medication list given to you today.   If you need a refill on your cardiac medications before your next appointment, please call your pharmacy.   Lab work: NONE today  If you have labs (blood work) drawn today and your tests are completely normal, you will receive your results only by: Marland Kitchen MyChart Message (if you have MyChart) OR . A paper copy in the mail If you have any lab test that is abnormal or we need to change your treatment, we will call you to review the results.  Testing/Procedures:  None TODAY  Follow-Up: At [facility], you and your health needs are our priority.  As part of our continuing mission to provide you with exceptional heart care, we have created designated Provider Care Teams.  These Care Teams include your primary Cardiologist (physician) and Advanced Practice Providers (APPs -  Physician Assistants and Nurse Practitioners) who all work together to provide you with the care you need, when you need it. You will need a follow up appointment in 12 months.  Please call our office 2 months in advance to schedule this appointment.  You may see Phill Mutter, MD or one of the

## 2018-03-15 ENCOUNTER — Encounter: Payer: Self-pay | Admitting: Family Medicine

## 2018-03-15 MED ORDER — ATORVASTATIN CALCIUM 80 MG PO TABS: 80.0000 mg | ORAL_TABLET | Freq: Every day | ORAL | 3 refills | Status: DC

## 2018-03-15 MED ORDER — METOPROLOL SUCCINATE ER 50 MG PO TB24: 50.0000 mg | ORAL_TABLET | Freq: Every day | ORAL | 3 refills | Status: DC

## 2018-03-16 MED ORDER — BUPROPION HCL ER (XL) 300 MG PO TB24: 300.0000 mg | ORAL_TABLET | Freq: Every day | ORAL | 1 refills | Status: DC

## 2018-05-18 ENCOUNTER — Encounter: Payer: Self-pay | Admitting: Family Medicine

## 2018-05-30 ENCOUNTER — Other Ambulatory Visit (INDEPENDENT_AMBULATORY_CARE_PROVIDER_SITE_OTHER): Payer: Managed Care, Other (non HMO)

## 2018-05-30 ENCOUNTER — Other Ambulatory Visit: Payer: Self-pay

## 2018-05-30 ENCOUNTER — Other Ambulatory Visit: Payer: Managed Care, Other (non HMO)

## 2018-05-30 DIAGNOSIS — R739 Hyperglycemia, unspecified: Secondary | ICD-10-CM

## 2018-05-30 DIAGNOSIS — D649 Anemia, unspecified: Secondary | ICD-10-CM

## 2018-05-30 DIAGNOSIS — Z79899 Other long term (current) drug therapy: Secondary | ICD-10-CM | POA: Diagnosis not present

## 2018-05-30 DIAGNOSIS — I10 Essential (primary) hypertension: Secondary | ICD-10-CM

## 2018-05-30 DIAGNOSIS — E782 Mixed hyperlipidemia: Secondary | ICD-10-CM | POA: Diagnosis not present

## 2018-05-30 LAB — COMPREHENSIVE METABOLIC PANEL
ALT: 24 U/L (ref 0–53)
AST: 22 U/L (ref 0–37)
Albumin: 4.4 g/dL (ref 3.5–5.2)
Alkaline Phosphatase: 84 U/L (ref 39–117)
BUN: 20 mg/dL (ref 6–23)
CO2: 27 mEq/L (ref 19–32)
Calcium: 9.3 mg/dL (ref 8.4–10.5)
Chloride: 103 mEq/L (ref 96–112)
Creatinine, Ser: 0.97 mg/dL (ref 0.40–1.50)
GFR: 79.47 mL/min (ref >60.00)
Glucose, Bld: 94 mg/dL (ref 70–99)
Potassium: 4.6 mEq/L (ref 3.5–5.1)
Sodium: 138 mEq/L (ref 135–145)
Total Bilirubin: 0.6 mg/dL (ref 0.2–1.2)
Total Protein: 7.1 g/dL (ref 6.0–8.3)

## 2018-05-30 LAB — CBC
HCT: 40.1 % (ref 39.0–52.0)
Hemoglobin: 13.5 g/dL (ref 13.0–17.0)
MCHC: 33.6 g/dL (ref 30.0–36.0)
MCV: 89 fl (ref 78.0–100.0)
Platelets: 217 10*3/uL (ref 150.0–400.0)
RBC: 4.51 Mil/uL (ref 4.22–5.81)
RDW: 15 % (ref 11.5–15.5)
WBC: 7.4 10*3/uL (ref 4.0–10.5)

## 2018-05-30 LAB — LIPID PANEL
Cholesterol: 104 mg/dL (ref 0–200)
HDL: 34.4 mg/dL — ABNORMAL LOW (ref >39.00)
LDL Cholesterol: 43 mg/dL (ref 0–99)
NonHDL: 69.11
Total CHOL/HDL Ratio: 3
Triglycerides: 133 mg/dL (ref 0.0–149.0)
VLDL: 26.6 mg/dL (ref 0.0–40.0)

## 2018-05-30 LAB — HEMOGLOBIN A1C: Hgb A1c MFr Bld: 6.2 % (ref 4.6–6.5)

## 2018-05-30 LAB — TSH: TSH: 3.38 u[IU]/mL (ref 0.35–4.50)

## 2018-06-01 LAB — PAIN MGMT, PROFILE 8 W/CONF, U
6 Acetylmorphine: NEGATIVE ng/mL
Alcohol Metabolites: POSITIVE ng/mL — AB (ref <500)
Amphetamine: NEGATIVE ng/mL
Amphetamines: NEGATIVE ng/mL
Benzodiazepines: NEGATIVE ng/mL
Buprenorphine, Urine: NEGATIVE ng/mL
Cocaine Metabolite: NEGATIVE ng/mL
Creatinine: 120 mg/dL
Ethyl Glucuronide (ETG): 7920 ng/mL
Ethyl Sulfate (ETS): 1273 ng/mL
MDMA: NEGATIVE ng/mL
Marijuana Metabolite: NEGATIVE ng/mL
Methamphetamine: NEGATIVE ng/mL
Opiates: NEGATIVE ng/mL
Oxidant: NEGATIVE ug/mL
Oxycodone: NEGATIVE ng/mL
pH: 5.3 (ref 4.5–9.0)

## 2018-06-02 ENCOUNTER — Encounter: Payer: Self-pay | Admitting: Family Medicine

## 2018-06-02 ENCOUNTER — Other Ambulatory Visit: Payer: Self-pay

## 2018-06-02 ENCOUNTER — Ambulatory Visit (INDEPENDENT_AMBULATORY_CARE_PROVIDER_SITE_OTHER): Payer: Managed Care, Other (non HMO) | Admitting: Family Medicine

## 2018-06-02 DIAGNOSIS — E782 Mixed hyperlipidemia: Secondary | ICD-10-CM

## 2018-06-02 DIAGNOSIS — R739 Hyperglycemia, unspecified: Secondary | ICD-10-CM | POA: Diagnosis not present

## 2018-06-02 DIAGNOSIS — G2581 Restless legs syndrome: Secondary | ICD-10-CM

## 2018-06-02 DIAGNOSIS — I10 Essential (primary) hypertension: Secondary | ICD-10-CM

## 2018-06-02 DIAGNOSIS — L578 Other skin changes due to chronic exposure to nonionizing radiation: Secondary | ICD-10-CM

## 2018-06-02 MED ORDER — ZOLPIDEM TARTRATE 10 MG PO TABS: ORAL_TABLET | ORAL | 5 refills | Status: DC

## 2018-06-02 MED ORDER — METHYLPREDNISOLONE 4 MG PO TABS: ORAL_TABLET | ORAL | 0 refills | Status: DC

## 2018-06-02 MED ORDER — ROPINIROLE HCL 0.5 MG PO TABS: 0.5000 mg | ORAL_TABLET | Freq: Every day | ORAL | 2 refills | Status: DC

## 2018-06-02 NOTE — Assessment & Plan Note (Signed)
Well controlled, no changes to meds. Encouraged heart healthy diet such as the DASH diet and exercise as tolerated.  °

## 2018-06-02 NOTE — Assessment & Plan Note (Signed)
Encouraged heart healthy diet, increase exercise, avoid trans fats, consider a krill oil cap daily 

## 2018-06-02 NOTE — Assessment & Plan Note (Signed)
hgba1c acceptable, minimize simple carbs. Increase exercise as tolerated.  

## 2018-06-05 DIAGNOSIS — L578 Other skin changes due to chronic exposure to nonionizing radiation: Secondary | ICD-10-CM | POA: Insufficient documentation

## 2018-06-05 DIAGNOSIS — G2581 Restless legs syndrome: Secondary | ICD-10-CM | POA: Insufficient documentation

## 2018-06-05 NOTE — Assessment & Plan Note (Signed)
Occurs annually is allowed a refill on steroids which he reports is the only thing that seems to help

## 2018-06-05 NOTE — Progress Notes (Signed)
Virtual Visit via Telephone Note  I connected with Dakota [name] on 06/05/18 at  9:00 AM EDT by a video enabled telemedicine application and verified that I am speaking with the correct person using two identifiers.  Location: Patient: home Provider: home   I discussed the limitations of evaluation and management by telemedicine and the availability of in person appointments. The patient expressed understanding and agreed to proceed. Magdalene Molly, CMA attempted to get the patient set up on a video visit but patient was unable to visit was completed on the telephone.      Subjective:    Patient ID: Dakota [name], male    DOB: March 18, 1960, 58 y.o.   MRN: [id number]  No chief complaint on file.   HPI Patient is in today for follow up on chronic medical concerns including incuding hypertension, allergies, depression. Hyperglycemia and hyperlipidemia. He feels well today. He is noting he is having increased stiffness and pain in his legs especially in the morning. He has restless legs at night and has trouble getting comfortable. No recent trauma or swelling. No recent febrile illness or hospitalizations. No polyuria or polydipsia. Denies CP/palp/SOB/HA/congestion/fevers/GI or GU c/o. Taking meds as prescribed  Past Medical History:  Diagnosis Date  . Allergy    cats  . Anxiety   . Asthma    childhood  . CAD (coronary artery disease)   . Chicken pox as a child  . Depression 12/25/2012  . Dysphagia 12/16/2015  . H/O tobacco use, presenting hazards to health 02/11/2011   1/2 ppd encouraged Has tried nicotine replacements and Chantix in past   . Hyperlipidemia   . Hypertension   . Insomnia 06/11/2013  . 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 Kidney stone on left side 09/19/2013  . Left knee pain 12/25/2012  . Mumps as a child  . Onychomycosis 09/30/2014  . Overweight 12/16/2015  . Preventative health care 09/24/2013    Past Surgical History:  Procedure Laterality Date  .  COLONOSCOPY    . CORONARY STENT PLACEMENT  01-14-05  . LITHOTRIPSY  2/16    Family History  Problem Relation Age of Onset  . Multiple sclerosis Mother   . Cancer Father        esophagus/ brain  . Alcohol abuse Father        smoker  . Esophageal cancer Father   . Seizures Sister        AVM  . COPD Sister   . Alcohol abuse Sister        addiction, recovered  . Heart disease Maternal Grandfather        MI  . Cancer Paternal Aunt        leukemia  . Colon cancer Neg Hx   . Colon polyps Neg Hx   . Rectal cancer Neg Hx   . Stomach cancer Neg Hx     Social History   Socioeconomic History  . Marital status: Legally Separated    Spouse name: Not on file  . Number of children: 1  . Years of education: Not on file  . Highest education level: Not on file  Occupational History  . Occupation: Clinical cytogeneticist  Social Needs  . Financial resource strain: Not on file  . Food insecurity:    Worry: Not on file    Inability: Not on file  . Transportation needs:    Medical: Not on file    Non-medical: Not on file  Tobacco Use  .  Smoking status: Former Smoker    Packs/day: 0.50    Years: 20.00    Pack years: 10.00    Types: Cigarettes    Last attempt to quit: 02/20/2015    Years since quitting: 3.2  . Smokeless tobacco: Never Used  Substance and Sexual Activity  . Alcohol use: Yes    Comment: 10 drinks per week  . Drug use: No  . Sexual activity: Yes    Partners: Female    Comment: wife, real Investment banker, corporate, no dairy, minimal fried foods  Lifestyle  . Physical activity:    Days per week: Not on file    Minutes per session: Not on file  . Stress: Not on file  Relationships  . Social connections:    Talks on phone: Not on file    Gets together: Not on file    Attends religious service: Not on file    Active member of club or organization: Not on file    Attends meetings of clubs or organizations: Not on file    Relationship status: Not on file  . Intimate  partner violence:    Fear of current or ex partner: Not on file    Emotionally abused: Not on file    Physically abused: Not on file    Forced sexual activity: Not on file  Other Topics Concern  . Not on file  Social History Narrative   1 biological son   1 adopted child   3 step children    Outpatient Medications Prior to Visit  Medication Sig Dispense Refill  . aspirin EC 81 MG tablet Take 81 mg by mouth daily.    Marland Kitchen atorvastatin (LIPITOR) 80 MG tablet Take 1 tablet (80 mg total) by mouth daily at 6 PM. 90 tablet 3  . buPROPion (WELLBUTRIN XL) 300 MG 24 hr tablet Take 1 tablet (300 mg total) by mouth daily. 90 tablet 1  . ezetimibe (ZETIA) 10 MG tablet Take 1 tablet (10 mg total) by mouth daily. 90 tablet 3  . KRILL OIL PO Take 1 capsule by mouth daily.    . meloxicam (MOBIC) 15 MG tablet TAKE 1 TABLET DAILY 90 tablet 4  . metoprolol succinate (TOPROL-XL) 50 MG 24 hr tablet Take 1 tablet (50 mg total) by mouth daily. Take with or immediately following a meal. 90 tablet 3  . PROBIOTIC PRODUCT PO Take 1 capsule by mouth daily. *Digestive Probiotic*    . sildenafil (REVATIO) 20 MG tablet Take 1-5 tablets (20-100 mg total) by mouth daily as needed. 35 tablet 1  . zolpidem (AMBIEN) 10 MG tablet TAKE 1/2 TO 1 TABLET BY MOUTH AT BEDTIME AS NEEDED FOR SLEEP 30 tablet 4   No facility-administered medications prior to visit.     Allergies  Allergen Reactions  . Neosporin [Neomycin-Bacitracin Zn-Polymyx] Hives and Itching  . Polysporin [Bacitracin-Polymyxin B] Hives and Itching    Review of Systems  Constitutional: Negative for fever and malaise/fatigue.  HENT: Negative for congestion.   Eyes: Positive for blurred vision.  Respiratory: Negative for shortness of breath.   Cardiovascular: Negative for chest pain, palpitations and leg swelling.  Gastrointestinal: Negative for abdominal pain, blood in stool and nausea.  Genitourinary: Negative for dysuria and frequency.  Musculoskeletal:  Positive for myalgias. Negative for falls.  Skin: Negative for rash.  Neurological: Negative for dizziness, loss of consciousness and headaches.  Endo/Heme/Allergies: Negative for environmental allergies.  Psychiatric/Behavioral: Negative for depression. The patient is not nervous/anxious.  Objective:    Physical Exam Unable to complete due to telephone visit.   BP 113/80 (BP Location: Left Arm, Patient Position: Sitting, Cuff Size: Normal)   Pulse 80   Wt 200 lb (90.7 kg)   BMI 27.12 kg/m  Wt Readings from Last 3 Encounters:  06/02/18 200 lb (90.7 kg)  03/09/18 207 lb 6.4 oz (94.1 kg)  11/26/17 208 lb 3.2 oz (94.4 kg)    Diabetic Foot Exam - Simple   No data filed     Lab Results  Component Value Date   WBC 7.4 05/30/2018   HGB 13.5 05/30/2018   HCT 40.1 05/30/2018   PLT 217.0 05/30/2018   GLUCOSE 94 05/30/2018   CHOL 104 05/30/2018   TRIG 133.0 05/30/2018   HDL 34.40 (L) 05/30/2018   LDLDIRECT 149.4 09/13/2007   LDLCALC 43 05/30/2018   ALT 24 05/30/2018   AST 22 05/30/2018   NA 138 05/30/2018   K 4.6 05/30/2018   CL 103 05/30/2018   CREATININE 0.97 05/30/2018   BUN 20 05/30/2018   CO2 27 05/30/2018   TSH 3.38 05/30/2018   PSA 0.65 11/22/2017   HGBA1C 6.2 05/30/2018    Lab Results  Component Value Date   TSH 3.38 05/30/2018   Lab Results  Component Value Date   WBC 7.4 05/30/2018   HGB 13.5 05/30/2018   HCT 40.1 05/30/2018   MCV 89.0 05/30/2018   PLT 217.0 05/30/2018   Lab Results  Component Value Date   NA 138 05/30/2018   K 4.6 05/30/2018   CO2 27 05/30/2018   GLUCOSE 94 05/30/2018   BUN 20 05/30/2018   CREATININE 0.97 05/30/2018   BILITOT 0.6 05/30/2018   ALKPHOS 84 05/30/2018   AST 22 05/30/2018   ALT 24 05/30/2018   PROT 7.1 05/30/2018   ALBUMIN 4.4 05/30/2018   CALCIUM 9.3 05/30/2018   ANIONGAP 8 03/11/2014   GFR 79.47 05/30/2018   Lab Results  Component Value Date   CHOL 104 05/30/2018   Lab Results  Component Value  Date   HDL 34.40 (L) 05/30/2018   Lab Results  Component Value Date   LDLCALC 43 05/30/2018   Lab Results  Component Value Date   TRIG 133.0 05/30/2018   Lab Results  Component Value Date   CHOLHDL 3 05/30/2018   Lab Results  Component Value Date   HGBA1C 6.2 05/30/2018       Assessment & Plan:   Problem List Items Addressed This Visit    Hyperlipidemia, mixed    Encouraged heart healthy diet, increase exercise, avoid trans fats, consider a krill oil cap daily      Essential hypertension    Well controlled, no changes to meds. Encouraged heart healthy diet such as the DASH diet and exercise as tolerated.       Hyperglycemia    hgba1c acceptable, minimize simple carbs. Increase exercise as tolerated.      RLS (restless legs syndrome)    Hydrate well, exercise and Requip 0.5 to 1 mg qhs.       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 Ruark start on rOPINIRole and methylPREDNISolone. I am also having him maintain his KRILL OIL PO, PROBIOTIC PRODUCT PO, aspirin EC, ezetimibe, meloxicam, sildenafil, atorvastatin, metoprolol succinate, buPROPion, and zolpidem.  Meds ordered this  encounter  Medications  . zolpidem (AMBIEN) 10 MG tablet    Sig: TAKE 1/2 TO 1 TABLET BY MOUTH AT BEDTIME AS NEEDED FOR SLEEP    Dispense:  30 tablet    Refill:  5  . rOPINIRole (REQUIP) 0.5 MG tablet    Sig: Take 1-2 tablets (0.5-1 mg total) by mouth at bedtime.    Dispense:  60 tablet    Refill:  2  . methylPREDNISolone (MEDROL) 4 MG tablet    Sig: 5 tab po qd X 1d then 4 tab po qd X 1d then 3 tab po qd X 1d then 2 tab po qd then 1 tab po qd    Dispense:  15 tablet    Refill:  0   I discussed the assessment and treatment plan with the patient. The patient was provided an opportunity to ask questions and all were answered. The patient agreed with the plan and  demonstrated an understanding of the instructions.   The patient was advised to call back or seek an in-person evaluation if the symptoms worsen or if the condition fails to improve as anticipated.   I provided 25 minutes of non-face-to-face time during this encounter.   Penni Homans, MD

## 2018-06-05 NOTE — Assessment & Plan Note (Signed)
Hydrate well, exercise and Requip 0.5 to 1 mg qhs.

## 2018-06-07 ENCOUNTER — Encounter: Payer: Self-pay | Admitting: Family Medicine

## 2018-06-24 ENCOUNTER — Encounter: Payer: Self-pay | Admitting: Family Medicine

## 2018-06-27 ENCOUNTER — Other Ambulatory Visit: Payer: Self-pay | Admitting: Family Medicine

## 2018-06-27 MED ORDER — MUPIROCIN 2 % EX OINT: 1.0000 "application " | TOPICAL_OINTMENT | Freq: Two times a day (BID) | CUTANEOUS | 1 refills | Status: DC

## 2018-07-13 ENCOUNTER — Ambulatory Visit (INDEPENDENT_AMBULATORY_CARE_PROVIDER_SITE_OTHER): Payer: Managed Care, Other (non HMO) | Admitting: *Deleted

## 2018-07-13 ENCOUNTER — Other Ambulatory Visit: Payer: Self-pay

## 2018-07-13 DIAGNOSIS — Z23 Encounter for immunization: Secondary | ICD-10-CM

## 2018-07-13 NOTE — Progress Notes (Signed)
Patient in today for first shingles vaccine.    Vaccine given and patient tolerated well  Patient scheduled for 2 months for 2nd vaccine.

## 2018-08-25 ENCOUNTER — Encounter: Payer: Self-pay | Admitting: Family Medicine

## 2018-08-30 ENCOUNTER — Other Ambulatory Visit: Payer: Self-pay | Admitting: Family Medicine

## 2018-08-30 NOTE — Telephone Encounter (Signed)
I reviewed pt's chart and do not see that he has had prednisone RX sent April. Called pt to see if appt is still needed and he states that he went through his insurance via [employer] and received a Prednisone Rx.

## 2018-09-13 ENCOUNTER — Other Ambulatory Visit: Payer: Self-pay

## 2018-09-13 ENCOUNTER — Ambulatory Visit (INDEPENDENT_AMBULATORY_CARE_PROVIDER_SITE_OTHER): Payer: Managed Care, Other (non HMO) | Admitting: *Deleted

## 2018-09-13 DIAGNOSIS — Z23 Encounter for immunization: Secondary | ICD-10-CM | POA: Diagnosis not present

## 2018-09-13 NOTE — Progress Notes (Signed)
Patient here for 2nd shingles vaccine per Dr. Charlett Blake  Shingles vaccine given and patient tolerated well.

## 2018-11-30 ENCOUNTER — Other Ambulatory Visit: Payer: Self-pay

## 2018-11-30 ENCOUNTER — Ambulatory Visit (INDEPENDENT_AMBULATORY_CARE_PROVIDER_SITE_OTHER): Payer: Managed Care, Other (non HMO)

## 2018-11-30 DIAGNOSIS — Z23 Encounter for immunization: Secondary | ICD-10-CM | POA: Diagnosis not present

## 2018-12-02 ENCOUNTER — Other Ambulatory Visit: Payer: Self-pay

## 2018-12-02 ENCOUNTER — Ambulatory Visit (INDEPENDENT_AMBULATORY_CARE_PROVIDER_SITE_OTHER): Payer: Managed Care, Other (non HMO) | Admitting: Family Medicine

## 2018-12-02 VITALS — Wt 200.0 lb

## 2018-12-02 DIAGNOSIS — Z79899 Other long term (current) drug therapy: Secondary | ICD-10-CM | POA: Diagnosis not present

## 2018-12-02 DIAGNOSIS — F329 Major depressive disorder, single episode, unspecified: Secondary | ICD-10-CM

## 2018-12-02 DIAGNOSIS — F32A Depression, unspecified: Secondary | ICD-10-CM

## 2018-12-02 DIAGNOSIS — E782 Mixed hyperlipidemia: Secondary | ICD-10-CM

## 2018-12-02 DIAGNOSIS — R739 Hyperglycemia, unspecified: Secondary | ICD-10-CM | POA: Diagnosis not present

## 2018-12-02 DIAGNOSIS — I10 Essential (primary) hypertension: Secondary | ICD-10-CM

## 2018-12-02 MED ORDER — MELOXICAM 15 MG PO TABS: 15.0000 mg | ORAL_TABLET | Freq: Every day | ORAL | 4 refills | Status: DC

## 2018-12-02 MED ORDER — ZOLPIDEM TARTRATE 10 MG PO TABS: ORAL_TABLET | ORAL | 5 refills | Status: DC

## 2018-12-02 MED ORDER — BUPROPION HCL ER (XL) 300 MG PO TB24: 300.0000 mg | ORAL_TABLET | Freq: Every day | ORAL | 3 refills | Status: DC

## 2018-12-04 NOTE — Assessment & Plan Note (Signed)
hgba1c acceptable, minimize simple carbs. Increase exercise as tolerated.  

## 2018-12-04 NOTE — Assessment & Plan Note (Signed)
Encouraged heart healthy diet, increase exercise, avoid trans fats, consider a krill oil cap daily 

## 2018-12-04 NOTE — Assessment & Plan Note (Signed)
Is doing well on current meds.

## 2018-12-04 NOTE — Assessment & Plan Note (Signed)
Check vitals weekly no changes to meds. Encouraged heart healthy diet such as the DASH diet and exercise as tolerated.  

## 2018-12-04 NOTE — Progress Notes (Signed)
Virtual Visit via Video Note  I connected with Dakota [name] on 12/02/18 at  9:20 AM EDT by a video enabled telemedicine application and verified that I am speaking with the correct person using two identifiers.  Location: Patient: home  Provider: home   I discussed the limitations of evaluation and management by telemedicine and the availability of in person appointments. The patient expressed understanding and agreed to proceed. Nevin Bloodgood, CMA was able to get the patient set up on a video visit.    Subjective:    Patient ID: Dakota [name], male    DOB: [birth year], 58 y.o.   MRN: [id number]  No chief complaint on file.   HPI Patient is in today for follow up on chronic medical conditions including hypertension, hyperlipidemia and more. He is doing well. No recent febrile illness or hospitalizations. No polyuria or polydipsia. He is managing the quarantine well and staying in frequently. Denies CP/palp/SOB/HA/congestion/fevers/GI or GU c/o. Taking meds as prescribed  Past Medical History:  Diagnosis Date  . Allergy    cats  . Anxiety   . Asthma    childhood  . CAD (coronary artery disease)   . Chicken pox as a child  . Depression 12/25/2012  . Dysphagia 12/16/2015  . H/O tobacco use, presenting hazards to health 02/11/2011   1/2 ppd encouraged Has tried nicotine replacements and Chantix in past   . Hyperlipidemia   . Hypertension   . Insomnia 06/11/2013  . 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 Kidney stone on left side 09/19/2013  . Left knee pain 12/25/2012  . Mumps as a child  . Onychomycosis 09/30/2014  . Overweight 12/16/2015  . Preventative health care 09/24/2013    Past Surgical History:  Procedure Laterality Date  . COLONOSCOPY    . CORONARY STENT PLACEMENT  01-14-05  . LITHOTRIPSY  2/16    Family History  Problem Relation Age of Onset  . Multiple sclerosis Mother   . Cancer Father        esophagus/ brain  . Alcohol abuse Father        smoker   . Esophageal cancer Father   . Seizures Sister        AVM  . COPD Sister   . Alcohol abuse Sister        addiction, recovered  . Heart disease Maternal Grandfather        MI  . Cancer Paternal Aunt        leukemia  . Colon cancer Neg Hx   . Colon polyps Neg Hx   . Rectal cancer Neg Hx   . Stomach cancer Neg Hx     Social History   Socioeconomic History  . Marital status: Married    Spouse name: Not on file  . Number of children: 1  . Years of education: Not on file  . Highest education level: Not on file  Occupational History  . Occupation: Clinical cytogeneticist  Social Needs  . Financial resource strain: Not on file  . Food insecurity    Worry: Not on file    Inability: Not on file  . Transportation needs    Medical: Not on file    Non-medical: Not on file  Tobacco Use  . Smoking status: Former Smoker    Packs/day: 0.50    Years: 20.00    Pack years: 10.00    Types: Cigarettes    Quit date: 02/20/2015    Years since quitting: 3.7  .  Smokeless tobacco: Never Used  Substance and Sexual Activity  . Alcohol use: Yes    Comment: 10 drinks per week  . Drug use: No  . Sexual activity: Yes    Partners: Female    Comment: wife, real Investment banker, corporate, no dairy, minimal fried foods  Lifestyle  . Physical activity    Days per week: Not on file    Minutes per session: Not on file  . Stress: Not on file  Relationships  . Social Herbalist on phone: Not on file    Gets together: Not on file    Attends religious service: Not on file    Active member of club or organization: Not on file    Attends meetings of clubs or organizations: Not on file    Relationship status: Not on file  . Intimate partner violence    Fear of current or ex partner: Not on file    Emotionally abused: Not on file    Physically abused: Not on file    Forced sexual activity: Not on file  Other Topics Concern  . Not on file  Social History Narrative   1 biological son   1 adopted  child   3 step children    Outpatient Medications Prior to Visit  Medication Sig Dispense Refill  . aspirin EC 81 MG tablet Take 81 mg by mouth daily.    Marland Kitchen atorvastatin (LIPITOR) 80 MG tablet Take 1 tablet (80 mg total) by mouth daily at 6 PM. 90 tablet 3  . ezetimibe (ZETIA) 10 MG tablet Take 1 tablet (10 mg total) by mouth daily. 90 tablet 3  . KRILL OIL PO Take 1 capsule by mouth daily.    . methylPREDNISolone (MEDROL) 4 MG tablet 5 tab po qd X 1d then 4 tab po qd X 1d then 3 tab po qd X 1d then 2 tab po qd then 1 tab po qd 15 tablet 0  . metoprolol succinate (TOPROL-XL) 50 MG 24 hr tablet Take 1 tablet (50 mg total) by mouth daily. Take with or immediately following a meal. 90 tablet 3  . mupirocin ointment (BACTROBAN) 2 % Place 1 application into the nose 2 (two) times daily. 30 g 1  . PROBIOTIC PRODUCT PO Take 1 capsule by mouth daily. *Digestive Probiotic*    . rOPINIRole (REQUIP) 0.5 MG tablet Take 1-2 tablets (0.5-1 mg total) by mouth at bedtime. 60 tablet 2  . sildenafil (REVATIO) 20 MG tablet Take 1-5 tablets (20-100 mg total) by mouth daily as needed. 35 tablet 1  . buPROPion (WELLBUTRIN XL) 300 MG 24 hr tablet TAKE 1 TABLET BY MOUTH DAILY 90 tablet 3  . meloxicam (MOBIC) 15 MG tablet TAKE 1 TABLET DAILY 90 tablet 4  . zolpidem (AMBIEN) 10 MG tablet TAKE 1/2 TO 1 TABLET BY MOUTH AT BEDTIME AS NEEDED FOR SLEEP 30 tablet 5   No facility-administered medications prior to visit.     Allergies  Allergen Reactions  . Neosporin [Neomycin-Bacitracin Zn-Polymyx] Hives and Itching  . Polysporin [Bacitracin-Polymyxin B] Hives and Itching    Review of Systems  Constitutional: Negative for fever and malaise/fatigue.  HENT: Negative for congestion.   Eyes: Negative for blurred vision.  Respiratory: Negative for shortness of breath.   Cardiovascular: Negative for chest pain, palpitations and leg swelling.  Gastrointestinal: Negative for abdominal pain, blood in stool and nausea.   Genitourinary: Negative for dysuria and frequency.  Musculoskeletal: Negative for falls.  Skin: Negative  for rash.  Neurological: Negative for dizziness, loss of consciousness and headaches.  Endo/Heme/Allergies: Negative for environmental allergies.  Psychiatric/Behavioral: Negative for depression. The patient is not nervous/anxious.        Objective:    Physical Exam Constitutional:      Appearance: Normal appearance. He is not ill-appearing.  HENT:     Head: Normocephalic and atraumatic.     Nose: Nose normal.  Eyes:     General:        Right eye: No discharge.        Left eye: No discharge.  Pulmonary:     Effort: Pulmonary effort is normal.  Neurological:     Mental Status: He is alert and oriented to person, place, and time.  Psychiatric:        Mood and Affect: Mood normal.        Behavior: Behavior normal.     Wt 200 lb (90.7 kg)   BMI 27.12 kg/m  Wt Readings from Last 3 Encounters:  12/02/18 200 lb (90.7 kg)  06/02/18 200 lb (90.7 kg)  03/09/18 207 lb 6.4 oz (94.1 kg)    Diabetic Foot Exam - Simple   No data filed     Lab Results  Component Value Date   WBC 7.4 05/30/2018   HGB 13.5 05/30/2018   HCT 40.1 05/30/2018   PLT 217.0 05/30/2018   GLUCOSE 94 05/30/2018   CHOL 104 05/30/2018   TRIG 133.0 05/30/2018   HDL 34.40 (L) 05/30/2018   LDLDIRECT 149.4 09/13/2007   LDLCALC 43 05/30/2018   ALT 24 05/30/2018   AST 22 05/30/2018   NA 138 05/30/2018   K 4.6 05/30/2018   CL 103 05/30/2018   CREATININE 0.97 05/30/2018   BUN 20 05/30/2018   CO2 27 05/30/2018   TSH 3.38 05/30/2018   PSA 0.65 11/22/2017   HGBA1C 6.2 05/30/2018    Lab Results  Component Value Date   TSH 3.38 05/30/2018   Lab Results  Component Value Date   WBC 7.4 05/30/2018   HGB 13.5 05/30/2018   HCT 40.1 05/30/2018   MCV 89.0 05/30/2018   PLT 217.0 05/30/2018   Lab Results  Component Value Date   NA 138 05/30/2018   K 4.6 05/30/2018   CO2 27 05/30/2018   GLUCOSE  94 05/30/2018   BUN 20 05/30/2018   CREATININE 0.97 05/30/2018   BILITOT 0.6 05/30/2018   ALKPHOS 84 05/30/2018   AST 22 05/30/2018   ALT 24 05/30/2018   PROT 7.1 05/30/2018   ALBUMIN 4.4 05/30/2018   CALCIUM 9.3 05/30/2018   ANIONGAP 8 03/11/2014   GFR 79.47 05/30/2018   Lab Results  Component Value Date   CHOL 104 05/30/2018   Lab Results  Component Value Date   HDL 34.40 (L) 05/30/2018   Lab Results  Component Value Date   LDLCALC 43 05/30/2018   Lab Results  Component Value Date   TRIG 133.0 05/30/2018   Lab Results  Component Value Date   CHOLHDL 3 05/30/2018   Lab Results  Component Value Date   HGBA1C 6.2 05/30/2018       Assessment & Plan:   Problem List Items Addressed This Visit    Hyperlipidemia, mixed    Encouraged heart healthy diet, increase exercise, avoid trans fats, consider a krill oil cap daily      Relevant Orders   Lipid panel   Essential hypertension    Check vitals weekly no changes to meds. Encouraged heart  healthy diet such as the DASH diet and exercise as tolerated.       Relevant Orders   CBC   Comprehensive metabolic panel   TSH   Depression    Is doing well on current meds.       Relevant Medications   buPROPion (WELLBUTRIN XL) 300 MG 24 hr tablet   Hyperglycemia - Primary    hgba1c acceptable, minimize simple carbs. Increase exercise as tolerated      Relevant Orders   Hemoglobin A1c    Other Visit Diagnoses    High risk medication use       Relevant Orders   Pain Mgmt, Profile 8 w/Conf, U      I have changed Janson A. Highfill's buPROPion and meloxicam. I am also having him maintain his KRILL OIL PO, PROBIOTIC PRODUCT PO, aspirin EC, ezetimibe, sildenafil, atorvastatin, metoprolol succinate, rOPINIRole, methylPREDNISolone, mupirocin ointment, and zolpidem.  Meds ordered this encounter  Medications  . buPROPion (WELLBUTRIN XL) 300 MG 24 hr tablet    Sig: Take 1 tablet (300 mg total) by mouth daily.     Dispense:  90 tablet    Refill:  3  . meloxicam (MOBIC) 15 MG tablet    Sig: Take 1 tablet (15 mg total) by mouth daily.    Dispense:  90 tablet    Refill:  4  . zolpidem (AMBIEN) 10 MG tablet    Sig: TAKE 1/2 TO 1 TABLET BY MOUTH AT BEDTIME AS NEEDED FOR SLEEP    Dispense:  30 tablet    Refill:  5  Reviewed the treatment plan with the patient. The patient was provided an opportunity to ask questions and all were answered. The patient agreed with the plan and demonstrated an understanding of the instructions.   The patient was advised to call back or seek an in-person evaluation if the symptoms worsen or if the condition fails to improve as anticipated.  I provided 25 minutes of non-face-to-face time during this encounter.   Penni Homans, MD

## 2018-12-13 ENCOUNTER — Other Ambulatory Visit: Payer: Self-pay

## 2018-12-13 ENCOUNTER — Other Ambulatory Visit (INDEPENDENT_AMBULATORY_CARE_PROVIDER_SITE_OTHER): Payer: Managed Care, Other (non HMO)

## 2018-12-13 DIAGNOSIS — Z79899 Other long term (current) drug therapy: Secondary | ICD-10-CM | POA: Diagnosis not present

## 2018-12-13 DIAGNOSIS — I10 Essential (primary) hypertension: Secondary | ICD-10-CM | POA: Diagnosis not present

## 2018-12-13 DIAGNOSIS — R739 Hyperglycemia, unspecified: Secondary | ICD-10-CM | POA: Diagnosis not present

## 2018-12-13 DIAGNOSIS — E782 Mixed hyperlipidemia: Secondary | ICD-10-CM | POA: Diagnosis not present

## 2018-12-13 LAB — LIPID PANEL
Cholesterol: 121 mg/dL (ref 0–200)
HDL: 39.6 mg/dL (ref >39.00)
LDL Cholesterol: 49 mg/dL (ref 0–99)
NonHDL: 81.72
Total CHOL/HDL Ratio: 3
Triglycerides: 162 mg/dL — ABNORMAL HIGH (ref 0.0–149.0)
VLDL: 32.4 mg/dL (ref 0.0–40.0)

## 2018-12-13 LAB — HEMOGLOBIN A1C: Hgb A1c MFr Bld: 6.1 % (ref 4.6–6.5)

## 2018-12-13 LAB — COMPREHENSIVE METABOLIC PANEL
ALT: 30 U/L (ref 0–53)
AST: 23 U/L (ref 0–37)
Albumin: 4.5 g/dL (ref 3.5–5.2)
Alkaline Phosphatase: 84 U/L (ref 39–117)
BUN: 24 mg/dL — ABNORMAL HIGH (ref 6–23)
CO2: 27 mEq/L (ref 19–32)
Calcium: 9.5 mg/dL (ref 8.4–10.5)
Chloride: 103 mEq/L (ref 96–112)
Creatinine, Ser: 1.01 mg/dL (ref 0.40–1.50)
GFR: 75.71 mL/min (ref >60.00)
Glucose, Bld: 105 mg/dL — ABNORMAL HIGH (ref 70–99)
Potassium: 4.4 mEq/L (ref 3.5–5.1)
Sodium: 137 mEq/L (ref 135–145)
Total Bilirubin: 0.7 mg/dL (ref 0.2–1.2)
Total Protein: 7.2 g/dL (ref 6.0–8.3)

## 2018-12-13 LAB — CBC
HCT: 40.7 % (ref 39.0–52.0)
Hemoglobin: 13.6 g/dL (ref 13.0–17.0)
MCHC: 33.4 g/dL (ref 30.0–36.0)
MCV: 89.4 fl (ref 78.0–100.0)
Platelets: 239 10*3/uL (ref 150.0–400.0)
RBC: 4.55 Mil/uL (ref 4.22–5.81)
RDW: 14 % (ref 11.5–15.5)
WBC: 5.4 10*3/uL (ref 4.0–10.5)

## 2018-12-13 LAB — TSH: TSH: 3.12 u[IU]/mL (ref 0.35–4.50)

## 2018-12-15 LAB — PAIN MGMT, PROFILE 8 W/CONF, U
6 Acetylmorphine: NEGATIVE ng/mL
Alcohol Metabolites: POSITIVE ng/mL — AB (ref <500)
Amphetamine: NEGATIVE ng/mL
Amphetamines: NEGATIVE ng/mL
Benzodiazepines: NEGATIVE ng/mL
Buprenorphine, Urine: NEGATIVE ng/mL
Cocaine Metabolite: NEGATIVE ng/mL
Creatinine: 178.5 mg/dL
Ethyl Glucuronide (ETG): 9841 ng/mL
Ethyl Sulfate (ETS): 2163 ng/mL
MDMA: NEGATIVE ng/mL
Marijuana Metabolite: NEGATIVE ng/mL
Methamphetamine: NEGATIVE ng/mL
Opiates: NEGATIVE ng/mL
Oxidant: NEGATIVE ug/mL
Oxycodone: NEGATIVE ng/mL
pH: 4.8 (ref 4.5–9.0)

## 2019-01-22 ENCOUNTER — Encounter: Payer: Self-pay | Admitting: Family Medicine

## 2019-01-24 ENCOUNTER — Encounter: Payer: Self-pay | Admitting: Family Medicine

## 2019-02-09 ENCOUNTER — Encounter: Payer: Self-pay | Admitting: Family Medicine

## 2019-02-09 DIAGNOSIS — M2550 Pain in unspecified joint: Secondary | ICD-10-CM

## 2019-02-09 DIAGNOSIS — M791 Myalgia, unspecified site: Secondary | ICD-10-CM

## 2019-02-10 NOTE — Addendum Note (Signed)
Addended by: Magdalene Molly A on: 02/10/2019 01:56 PM   Modules accepted: Orders

## 2019-03-06 NOTE — Progress Notes (Signed)
HPI: FU coronary artery disease. 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 Cath revealed normal LM. Left anterior descending artery had 30% lesion in the proximal and mid-vessel. Lcx with 60 % lesion. There were left-to-right collaterals to the RCA. Right coronary artery was dominant and was 100% midvessel occlusion after the takeoff of a medium-sized RV branch; inferobasal wall akinesis. EF was 50%. He underwent PCI of the right coronary artery with bare-metal stent. EF was 50%. Stress echocardiogram in February 2013 was normal. Carotid Dopplers June 2018 showed 1 to 39% bilateral stenosis.  Since last seen, the patient denies any dyspnea on exertion, orthopnea, PND, pedal edema, palpitations, syncope or chest pain.   Current Outpatient Medications  Medication Sig Dispense Refill  . aspirin EC 81 MG tablet Take 81 mg by mouth daily.    Marland Kitchen atorvastatin (LIPITOR) 80 MG tablet Take 1 tablet (80 mg total) by mouth daily at 6 PM. 90 tablet 3  . buPROPion (WELLBUTRIN XL) 300 MG 24 hr tablet Take 1 tablet (300 mg total) by mouth daily. 90 tablet 3  . meloxicam (MOBIC) 15 MG tablet Take 1 tablet (15 mg total) by mouth daily. 90 tablet 4  . methylPREDNISolone (MEDROL) 4 MG tablet 5 tab po qd X 1d then 4 tab po qd X 1d then 3 tab po qd X 1d then 2 tab po qd then 1 tab po qd 15 tablet 0  . metoprolol succinate (TOPROL-XL) 50 MG 24 hr tablet Take 1 tablet (50 mg total) by mouth daily. Take with or immediately following a meal. 90 tablet 3  . mupirocin ointment (BACTROBAN) 2 % Place 1 application into the nose 2 (two) times daily. 30 g 1  . rOPINIRole (REQUIP) 0.5 MG tablet Take 1-2 tablets (0.5-1 mg total) by mouth at bedtime. 60 tablet 2  . zolpidem (AMBIEN) 10 MG tablet TAKE 1/2 TO 1 TABLET BY MOUTH AT BEDTIME AS NEEDED FOR SLEEP 30 tablet 5  . ezetimibe (ZETIA) 10 MG tablet Take 1 tablet (10 mg total) by mouth daily. 90 tablet 3   No current facility-administered medications for  this visit.     Past Medical History:  Diagnosis Date  . Allergy    cats  . Anxiety   . Asthma    childhood  . CAD (coronary artery disease)   . Chicken pox as a child  . Depression 12/25/2012  . Dysphagia 12/16/2015  . H/O tobacco use, presenting hazards to health 02/11/2011   1/2 ppd encouraged Has tried nicotine replacements and Chantix in past   . Hyperlipidemia   . Hypertension   . Insomnia 06/11/2013  . 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 Kidney stone on left side 09/19/2013  . Left knee pain 12/25/2012  . Mumps as a child  . Onychomycosis 09/30/2014  . Overweight 12/16/2015  . Preventative health care 09/24/2013    Past Surgical History:  Procedure Laterality Date  . COLONOSCOPY    . CORONARY STENT PLACEMENT  01-14-05  . LITHOTRIPSY  2/16    Social History   Socioeconomic History  . Marital status: Married    Spouse name: Not on file  . Number of children: 1  . Years of education: Not on file  . Highest education level: Not on file  Occupational History  . Occupation: Clinical cytogeneticist  Tobacco Use  . Smoking status: Former Smoker    Packs/day: 0.50    Years: 20.00    Pack  years: 10.00    Types: Cigarettes    Quit date: 02/20/2015    Years since quitting: 4.0  . Smokeless tobacco: Never Used  Substance and Sexual Activity  . Alcohol use: Yes    Comment: 10 drinks per week  . Drug use: No  . Sexual activity: Yes    Partners: Female    Comment: wife, real Investment banker, corporate, no dairy, minimal fried foods  Other Topics Concern  . Not on file  Social History Narrative   1 biological son   1 adopted child   3 step children   Social Determinants of Health   Financial Resource Strain:   . Difficulty of Paying Living Expenses: Not on file  Food Insecurity:   . Worried About Charity fundraiser in the Last Year: Not on file  . Ran Out of Food in the Last Year: Not on file  Transportation Needs:   . Lack of Transportation (Medical): Not on  file  . Lack of Transportation (Non-Medical): Not on file  Physical Activity:   . Days of Exercise per Week: Not on file  . Minutes of Exercise per Session: Not on file  Stress:   . Feeling of Stress : Not on file  Social Connections:   . Frequency of Communication with Friends and Family: Not on file  . Frequency of Social Gatherings with Friends and Family: Not on file  . Attends Religious Services: Not on file  . Active Member of Clubs or Organizations: Not on file  . Attends Archivist Meetings: Not on file  . Marital Status: Not on file  Intimate Partner Violence:   . Fear of Current or Ex-Partner: Not on file  . Emotionally Abused: Not on file  . Physically Abused: Not on file  . Sexually Abused: Not on file    Family History  Problem Relation Age of Onset  . Multiple sclerosis Mother   . Cancer Father        esophagus/ brain  . Alcohol abuse Father        smoker  . Esophageal cancer Father   . Seizures Sister        AVM  . COPD Sister   . Alcohol abuse Sister        addiction, recovered  . Heart disease Maternal Grandfather        MI  . Cancer Paternal Aunt        leukemia  . Colon cancer Neg Hx   . Colon polyps Neg Hx   . Rectal cancer Neg Hx   . Stomach cancer Neg Hx     ROS: no fevers or chills, productive cough, hemoptysis, dysphasia, odynophagia, melena, hematochezia, dysuria, hematuria, rash, seizure activity, orthopnea, PND, pedal edema, claudication. Remaining systems are negative.  Physical Exam: Well-developed well-nourished in no acute distress.  Skin is warm and dry.  HEENT is normal.  Neck is supple.  Chest is clear to auscultation with normal expansion.  Cardiovascular exam is regular rate and rhythm.  Abdominal exam nontender or distended. No masses palpated. Extremities show no edema. neuro grossly intact  ECG-sinus rhythm at a rate of 62, no ST changes, cannot rule out inferior infarct.  Personally reviewed  A/P  1  coronary artery disease-patient has not had recurrent chest pain.  Continue aspirin and statin.  2 hypertension-blood pressure elevated; add losartan 50 mg daily.  Follow blood pressure and advance regimen as needed.  Check potassium and renal function in 1 week.  3 hyperlipidemia-continue statin.  4 carotid artery disease-mild on most recent Dopplers.  Plan to continue medical therapy.  Kirk Ruths, MD

## 2019-03-13 ENCOUNTER — Other Ambulatory Visit: Payer: Self-pay

## 2019-03-13 ENCOUNTER — Ambulatory Visit: Payer: Managed Care, Other (non HMO) | Admitting: Cardiology

## 2019-03-13 ENCOUNTER — Encounter: Payer: Self-pay | Admitting: Cardiology

## 2019-03-13 VITALS — BP 136/90 | HR 62 | Temp 96.0°F | Ht 72.0 in | Wt 209.0 lb

## 2019-03-13 DIAGNOSIS — I10 Essential (primary) hypertension: Secondary | ICD-10-CM | POA: Diagnosis not present

## 2019-03-13 DIAGNOSIS — E7800 Pure hypercholesterolemia, unspecified: Secondary | ICD-10-CM

## 2019-03-13 DIAGNOSIS — I2510 Atherosclerotic heart disease of native coronary artery without angina pectoris: Secondary | ICD-10-CM

## 2019-03-13 MED ORDER — ATORVASTATIN CALCIUM 80 MG PO TABS: 80.0000 mg | ORAL_TABLET | Freq: Every day | ORAL | 3 refills | Status: DC

## 2019-03-13 MED ORDER — METOPROLOL SUCCINATE ER 50 MG PO TB24: 50.0000 mg | ORAL_TABLET | Freq: Every day | ORAL | 3 refills | Status: DC

## 2019-03-13 MED ORDER — EZETIMIBE 10 MG PO TABS: 10.0000 mg | ORAL_TABLET | Freq: Every day | ORAL | 3 refills | Status: DC

## 2019-03-13 MED ORDER — LOSARTAN POTASSIUM 50 MG PO TABS: 50.0000 mg | ORAL_TABLET | Freq: Every day | ORAL | 3 refills | Status: DC

## 2019-03-13 NOTE — Patient Instructions (Signed)
Medication Instructions:  START LOSARTAN 50 MG ONCE DAILY  *If you need a refill on your cardiac medications before your next appointment, please call your pharmacy*  Lab Work: Your physician recommends that you return for lab work [place]  If you have labs (blood work) drawn today and your tests are completely normal, you will receive your results only by: Marland Kitchen MyChart Message (if you have MyChart) OR . A paper copy in the mail If you have any lab test that is abnormal or we need to change your treatment, we will call you to review the results.  Follow-Up: At [facility], you and your health needs are our priority.  As part of our continuing mission to provide you with exceptional heart care, we have created designated Provider Care Teams.  These Care Teams include your primary Cardiologist (physician) and Advanced Practice Providers (APPs -  Physician Assistants and Nurse Practitioners) who all work together to provide you with the care you need, when you need it.  Your next appointment:   12 month(s)  The format for your next appointment:   Either In Person or Virtual  Provider:   You may see Kirk Ruths MD or one of the following Advanced Practice Providers on your designated Care Team:    Kerin Ransom, PA-C  [place], [place]  Coletta Memos, [place]

## 2019-03-31 LAB — BASIC METABOLIC PANEL
BUN/Creatinine Ratio: 14 (ref 9–20)
BUN: 17 mg/dL (ref 6–24)
CO2: 23 mmol/L (ref 20–29)
Calcium: 9.3 mg/dL (ref 8.7–10.2)
Chloride: 103 mmol/L (ref 96–106)
Creatinine, Ser: 1.21 mg/dL (ref 0.76–1.27)
GFR calc Af Amer: 76 mL/min/{1.73_m2} (ref >59)
GFR calc non Af Amer: 66 mL/min/{1.73_m2} (ref >59)
Glucose: 93 mg/dL (ref 65–99)
Potassium: 4.5 mmol/L (ref 3.5–5.2)
Sodium: 140 mmol/L (ref 134–144)

## 2019-04-25 ENCOUNTER — Encounter: Payer: Self-pay | Admitting: Family Medicine

## 2019-05-30 ENCOUNTER — Ambulatory Visit (INDEPENDENT_AMBULATORY_CARE_PROVIDER_SITE_OTHER): Payer: Managed Care, Other (non HMO) | Admitting: Family Medicine

## 2019-05-30 ENCOUNTER — Encounter: Payer: Self-pay | Admitting: Family Medicine

## 2019-05-30 ENCOUNTER — Other Ambulatory Visit: Payer: Self-pay

## 2019-05-30 VITALS — BP 126/82 | HR 68 | Temp 98.0°F | Resp 12 | Ht 72.0 in | Wt 204.0 lb

## 2019-05-30 DIAGNOSIS — Z0000 Encounter for general adult medical examination without abnormal findings: Secondary | ICD-10-CM

## 2019-05-30 DIAGNOSIS — R351 Nocturia: Secondary | ICD-10-CM | POA: Diagnosis not present

## 2019-05-30 DIAGNOSIS — E782 Mixed hyperlipidemia: Secondary | ICD-10-CM | POA: Diagnosis not present

## 2019-05-30 DIAGNOSIS — I10 Essential (primary) hypertension: Secondary | ICD-10-CM

## 2019-05-30 DIAGNOSIS — N529 Male erectile dysfunction, unspecified: Secondary | ICD-10-CM

## 2019-05-30 DIAGNOSIS — R739 Hyperglycemia, unspecified: Secondary | ICD-10-CM | POA: Diagnosis not present

## 2019-05-30 DIAGNOSIS — N200 Calculus of kidney: Secondary | ICD-10-CM | POA: Diagnosis not present

## 2019-05-30 LAB — COMPREHENSIVE METABOLIC PANEL
ALT: 31 U/L (ref 0–53)
AST: 26 U/L (ref 0–37)
Albumin: 4.4 g/dL (ref 3.5–5.2)
Alkaline Phosphatase: 102 U/L (ref 39–117)
BUN: 17 mg/dL (ref 6–23)
CO2: 29 mEq/L (ref 19–32)
Calcium: 9.4 mg/dL (ref 8.4–10.5)
Chloride: 100 mEq/L (ref 96–112)
Creatinine, Ser: 1.04 mg/dL (ref 0.40–1.50)
GFR: 73.08 mL/min (ref >60.00)
Glucose, Bld: 99 mg/dL (ref 70–99)
Potassium: 4.7 mEq/L (ref 3.5–5.1)
Sodium: 136 mEq/L (ref 135–145)
Total Bilirubin: 0.6 mg/dL (ref 0.2–1.2)
Total Protein: 7.4 g/dL (ref 6.0–8.3)

## 2019-05-30 LAB — URINALYSIS
Bilirubin Urine: NEGATIVE
Hgb urine dipstick: NEGATIVE
Ketones, ur: NEGATIVE
Leukocytes,Ua: NEGATIVE
Nitrite: NEGATIVE
Specific Gravity, Urine: 1.015 (ref 1.000–1.030)
Total Protein, Urine: NEGATIVE
Urine Glucose: NEGATIVE
Urobilinogen, UA: 0.2 (ref 0.0–1.0)
pH: 6 (ref 5.0–8.0)

## 2019-05-30 LAB — CBC
HCT: 38 % — ABNORMAL LOW (ref 39.0–52.0)
Hemoglobin: 12.3 g/dL — ABNORMAL LOW (ref 13.0–17.0)
MCHC: 32.3 g/dL (ref 30.0–36.0)
MCV: 82.7 fl (ref 78.0–100.0)
Platelets: 288 10*3/uL (ref 150.0–400.0)
RBC: 4.59 Mil/uL (ref 4.22–5.81)
RDW: 16.5 % — ABNORMAL HIGH (ref 11.5–15.5)
WBC: 5.1 10*3/uL (ref 4.0–10.5)

## 2019-05-30 LAB — LIPID PANEL
Cholesterol: 132 mg/dL (ref 0–200)
HDL: 40.8 mg/dL (ref >39.00)
LDL Cholesterol: 60 mg/dL (ref 0–99)
NonHDL: 91.23
Total CHOL/HDL Ratio: 3
Triglycerides: 156 mg/dL — ABNORMAL HIGH (ref 0.0–149.0)
VLDL: 31.2 mg/dL (ref 0.0–40.0)

## 2019-05-30 LAB — TSH: TSH: 2.44 u[IU]/mL (ref 0.35–4.50)

## 2019-05-30 LAB — PSA: PSA: 0.86 ng/mL (ref 0.10–4.00)

## 2019-05-30 LAB — HEMOGLOBIN A1C: Hgb A1c MFr Bld: 6.1 % (ref 4.6–6.5)

## 2019-05-30 NOTE — Assessment & Plan Note (Signed)
Passed a few kidney stones recently without pain after increasing his tea consumption. He has increased his water intake and is doing better.

## 2019-05-30 NOTE — Assessment & Plan Note (Signed)
hgba1c acceptable, minimize simple carbs. Increase exercise as tolerated.  

## 2019-05-30 NOTE — Patient Instructions (Signed)
Preventive Care 41-59 Years Old, Male Preventive care refers to lifestyle choices and visits with your health care provider that can promote health and wellness. This includes:  A yearly physical exam. This is also called an annual well check.  Regular dental and eye exams.  Immunizations.  Screening for certain conditions.  Healthy lifestyle choices, such as eating a healthy diet, getting regular exercise, not using drugs or products that contain nicotine and tobacco, and limiting alcohol use. What can I expect for my preventive care visit? Physical exam Your health care provider will check:  Height and weight. These may be used to calculate body mass index (BMI), which is a measurement that tells if you are at a healthy weight.  Heart rate and blood pressure.  Your skin for abnormal spots. Counseling Your health care provider may ask you questions about:  Alcohol, tobacco, and drug use.  Emotional well-being.  Home and relationship well-being.  Sexual activity.  Eating habits.  Work and work Statistician. What immunizations do I need?  Influenza (flu) vaccine  This is recommended every year. Tetanus, diphtheria, and pertussis (Tdap) vaccine  You may need a Td booster every 10 years. Varicella (chickenpox) vaccine  You may need this vaccine if you have not already been vaccinated. Zoster (shingles) vaccine  You may need this after age 64. Measles, mumps, and rubella (MMR) vaccine  You may need at least one dose of MMR if you were born in 1957 or later. You may also need a second dose. Pneumococcal conjugate (PCV13) vaccine  You may need this if you have certain conditions and were not previously vaccinated. Pneumococcal polysaccharide (PPSV23) vaccine  You may need one or two doses if you smoke cigarettes or if you have certain conditions. Meningococcal conjugate (MenACWY) vaccine  You may need this if you have certain conditions. Hepatitis A  vaccine  You may need this if you have certain conditions or if you travel or work in places where you may be exposed to hepatitis A. Hepatitis B vaccine  You may need this if you have certain conditions or if you travel or work in places where you may be exposed to hepatitis B. Haemophilus influenzae type b (Hib) vaccine  You may need this if you have certain risk factors. Human papillomavirus (HPV) vaccine  If recommended by your health care provider, you may need three doses over 6 months. You may receive vaccines as individual doses or as more than one vaccine together in one shot (combination vaccines). Talk with your health care provider about the risks and benefits of combination vaccines. What tests do I need? Blood tests  Lipid and cholesterol levels. These may be checked every 5 years, or more frequently if you are over 60 years old.  Hepatitis C test.  Hepatitis B test. Screening  Lung cancer screening. You may have this screening every year starting at age 43 if you have a 30-pack-year history of smoking and currently smoke or have quit within the past 15 years.  Prostate cancer screening. Recommendations will vary depending on your family history and other risks.  Colorectal cancer screening. All adults should have this screening starting at age 72 and continuing until age 2. Your health care provider may recommend screening at age 14 if you are at increased risk. You will have tests every 1-10 years, depending on your results and the type of screening test.  Diabetes screening. This is done by checking your blood sugar (glucose) after you have not eaten  for a while (fasting). You may have this done every 1-3 years.  Sexually transmitted disease (STD) testing. Follow these instructions at home: Eating and drinking  Eat a diet that includes fresh fruits and vegetables, whole grains, lean protein, and low-fat dairy products.  Take vitamin and mineral supplements as  recommended by your health care provider.  Do not drink alcohol if your health care provider tells you not to drink.  If you drink alcohol: ? Limit how much you have to 0-2 drinks a day. ? Be aware of how much alcohol is in your drink. In the [place] one drink equals one 12 oz bottle of beer (355 mL), one 5 oz glass of wine (148 mL), or one 1 oz glass of hard liquor (44 mL). Lifestyle  Take daily care of your teeth and gums.  Stay active. Exercise for at least 30 minutes on 5 or more days each week.  Do not use any products that contain nicotine or tobacco, such as cigarettes, e-cigarettes, and chewing tobacco. If you need help quitting, ask your health care provider.  If you are sexually active, practice safe sex. Use a condom or other form of protection to prevent STIs (sexually transmitted infections).  Talk with your health care provider about taking a low-dose aspirin every day starting at age 53. What's next?  Go to your health care provider once a year for a well check visit.  Ask your health care provider how often you should have your eyes and teeth checked.  Stay up to date on all vaccines. This information is not intended to replace advice given to you by your health care provider. Make sure you discuss any questions you have with your health care provider. Document Revised: 01/13/2018 Document Reviewed: 01/13/2018 Elsevier Patient Education  2020 [employer].

## 2019-05-30 NOTE — Assessment & Plan Note (Signed)
Tolerating statin, encouraged heart healthy diet, avoid trans fats, minimize simple carbs and saturated fats. Increase exercise as tolerated 

## 2019-05-30 NOTE — Assessment & Plan Note (Signed)
Well controlled, no changes to meds. Encouraged heart healthy diet such as the DASH diet and exercise as tolerated.  °

## 2019-05-30 NOTE — Assessment & Plan Note (Signed)
With some nocturia at times. Check PSA

## 2019-05-31 ENCOUNTER — Other Ambulatory Visit: Payer: Self-pay | Admitting: Family Medicine

## 2019-05-31 ENCOUNTER — Other Ambulatory Visit: Payer: Self-pay | Admitting: *Deleted

## 2019-05-31 ENCOUNTER — Other Ambulatory Visit (INDEPENDENT_AMBULATORY_CARE_PROVIDER_SITE_OTHER): Payer: Managed Care, Other (non HMO)

## 2019-05-31 DIAGNOSIS — D539 Nutritional anemia, unspecified: Secondary | ICD-10-CM

## 2019-05-31 DIAGNOSIS — D649 Anemia, unspecified: Secondary | ICD-10-CM

## 2019-05-31 LAB — IBC + FERRITIN
Ferritin: 11.3 ng/mL — ABNORMAL LOW (ref 22.0–322.0)
Iron: 99 ug/dL (ref 42–165)
Saturation Ratios: 22.6 % (ref 20.0–50.0)
Transferrin: 313 mg/dL (ref 212.0–360.0)

## 2019-05-31 LAB — IRON: Iron: 99 ug/dL (ref 42–165)

## 2019-05-31 NOTE — Assessment & Plan Note (Signed)
Patient encouraged to maintain heart healthy diet, regular exercise, adequate sleep. Consider daily probiotics. Take medications as prescribed. Labs ordered and reviewed 

## 2019-05-31 NOTE — Progress Notes (Signed)
Patient ID: Dakota [name], male   DOB: 03-Aug-1960, 59 y.o.   MRN: [id number]   Subjective:    Patient ID: Dakota [name], male    DOB: 1960-04-17, 59 y.o.   MRN: [id number]  Chief Complaint  Patient presents with  . Annual Exam    HPI Patient is in today for annual preventative exam. No recent febrile illness or hospitalizations. He has been working from home during the pandemic but that is is normal state of affairs. He is feeling well. He tries to stay active and maintain a heart healthy diet. Denies CP/palp/SOB/HA/congestion/fevers/GI or GU c/o. Taking meds as prescribed  Past Medical History:  Diagnosis Date  . Allergy    cats  . Anxiety   . Asthma    childhood  . CAD (coronary artery disease)   . Chicken pox as a child  . Depression 12/25/2012  . Dysphagia 12/16/2015  . H/O tobacco use, presenting hazards to health 02/11/2011   1/2 ppd encouraged Has tried nicotine replacements and Chantix in past   . Hyperlipidemia   . Hypertension   . Insomnia 06/11/2013  . 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 Kidney stone on left side 09/19/2013  . Left knee pain 12/25/2012  . Mumps as a child  . Onychomycosis 09/30/2014  . Overweight 12/16/2015  . Preventative health care 09/24/2013    Past Surgical History:  Procedure Laterality Date  . COLONOSCOPY    . CORONARY STENT PLACEMENT  01-14-05  . LITHOTRIPSY  2/16    Family History  Problem Relation Age of Onset  . Multiple sclerosis Mother   . Cancer Father        esophagus/ brain  . Alcohol abuse Father        smoker  . Esophageal cancer Father   . Seizures Sister        AVM  . COPD Sister   . Alcohol abuse Sister        addiction, recovered  . Heart disease Maternal Grandfather        MI  . Cancer Paternal Aunt        leukemia  . Colon cancer Neg Hx   . Colon polyps Neg Hx   . Rectal cancer Neg Hx   . Stomach cancer Neg Hx     Social History   Socioeconomic History  . Marital status: Married    Spouse  name: Not on file  . Number of children: 1  . Years of education: Not on file  . Highest education level: Not on file  Occupational History  . Occupation: Clinical cytogeneticist  Tobacco Use  . Smoking status: Former Smoker    Packs/day: 0.50    Years: 20.00    Pack years: 10.00    Types: Cigarettes    Quit date: 02/20/2015    Years since quitting: 4.2  . Smokeless tobacco: Never Used  Substance and Sexual Activity  . Alcohol use: Yes    Comment: 10 drinks per week  . Drug use: No  . Sexual activity: Yes    Partners: Female    Comment: wife, real Investment banker, corporate, no dairy, minimal fried foods  Other Topics Concern  . Not on file  Social History Narrative   1 biological son   1 adopted child   3 step children   Social Determinants of Health   Financial Resource Strain:   . Difficulty of Paying Living Expenses:   Food Insecurity:   . Worried  About Running Out of Food in the Last Year:   . [place] in the Last Year:   Transportation Needs:   . Lack of Transportation (Medical):   Marland Kitchen Lack of Transportation (Non-Medical):   Physical Activity:   . Days of Exercise per Week:   . Minutes of Exercise per Session:   Stress:   . Feeling of Stress :   Social Connections:   . Frequency of Communication with Friends and Family:   . Frequency of Social Gatherings with Friends and Family:   . Attends Religious Services:   . Active Member of Clubs or Organizations:   . Attends Archivist Meetings:   Marland Kitchen Marital Status:   Intimate Partner Violence:   . Fear of Current or Ex-Partner:   . Emotionally Abused:   Marland Kitchen Physically Abused:   . Sexually Abused:     Outpatient Medications Prior to Visit  Medication Sig Dispense Refill  . aspirin EC 81 MG tablet Take 81 mg by mouth daily.    Marland Kitchen atorvastatin (LIPITOR) 80 MG tablet Take 1 tablet (80 mg total) by mouth daily at 6 PM. 90 tablet 3  . buPROPion (WELLBUTRIN XL) 300 MG 24 hr tablet Take 1 tablet (300 mg total) by  mouth daily. 90 tablet 3  . ezetimibe (ZETIA) 10 MG tablet Take 1 tablet (10 mg total) by mouth daily. 90 tablet 3  . losartan (COZAAR) 50 MG tablet Take 1 tablet (50 mg total) by mouth daily. 90 tablet 3  . meloxicam (MOBIC) 15 MG tablet Take 1 tablet (15 mg total) by mouth daily. 90 tablet 4  . metoprolol succinate (TOPROL-XL) 50 MG 24 hr tablet Take 1 tablet (50 mg total) by mouth daily. Take with or immediately following a meal. 90 tablet 3  . mupirocin ointment (BACTROBAN) 2 % Place 1 application into the nose 2 (two) times daily. 30 g 1  . rOPINIRole (REQUIP) 0.5 MG tablet Take 1-2 tablets (0.5-1 mg total) by mouth at bedtime. 60 tablet 2  . zolpidem (AMBIEN) 10 MG tablet TAKE 1/2 TO 1 TABLET BY MOUTH AT BEDTIME AS NEEDED FOR SLEEP 30 tablet 5  . methylPREDNISolone (MEDROL) 4 MG tablet 5 tab po qd X 1d then 4 tab po qd X 1d then 3 tab po qd X 1d then 2 tab po qd then 1 tab po qd 15 tablet 0   No facility-administered medications prior to visit.    Allergies  Allergen Reactions  . Neosporin [Neomycin-Bacitracin Zn-Polymyx] Hives and Itching  . Polysporin [Bacitracin-Polymyxin B] Hives and Itching  . Amoxicillin Rash    Review of Systems  Constitutional: Negative for chills, fever and malaise/fatigue.  HENT: Negative for congestion and hearing loss.   Eyes: Negative for discharge.  Respiratory: Negative for cough, sputum production and shortness of breath.   Cardiovascular: Negative for chest pain, palpitations and leg swelling.  Gastrointestinal: Negative for abdominal pain, blood in stool, constipation, diarrhea, heartburn, nausea and vomiting.  Genitourinary: Negative for dysuria, frequency, hematuria and urgency.  Musculoskeletal: Negative for back pain, falls and myalgias.  Skin: Negative for rash.  Neurological: Negative for dizziness, sensory change, loss of consciousness, weakness and headaches.  Endo/Heme/Allergies: Negative for environmental allergies. Does not  bruise/bleed easily.  Psychiatric/Behavioral: Negative for depression and suicidal ideas. The patient is not nervous/anxious and does not have insomnia.        Objective:    Physical Exam Vitals and nursing note reviewed.  Constitutional:  General: He is not in acute distress.    Appearance: Normal appearance. He is well-developed. He is not ill-appearing.  HENT:     Head: Normocephalic and atraumatic.     Right Ear: Tympanic membrane, ear canal and external ear normal.     Left Ear: Tympanic membrane, ear canal and external ear normal.     Nose: Nose normal.  Eyes:     General:        Right eye: No discharge.        Left eye: No discharge.     Extraocular Movements: Extraocular movements intact.     Pupils: Pupils are equal, round, and reactive to light.  Cardiovascular:     Rate and Rhythm: Normal rate and regular rhythm.     Heart sounds: No murmur.  Pulmonary:     Effort: Pulmonary effort is normal.     Breath sounds: Normal breath sounds.  Abdominal:     General: Bowel sounds are normal.     Palpations: Abdomen is soft. There is no mass.     Tenderness: There is no abdominal tenderness. There is no guarding.  Musculoskeletal:        General: No tenderness. Normal range of motion.     Cervical back: Normal range of motion and neck supple.  Skin:    General: Skin is warm and dry.  Neurological:     General: No focal deficit present.     Mental Status: He is alert and oriented to person, place, and time.     Deep Tendon Reflexes: Reflexes normal.  Psychiatric:        Behavior: Behavior normal.     BP 126/82 (BP Location: Right Arm, Cuff Size: Normal)   Pulse 68   Temp 98 F (36.7 C) (Temporal)   Resp 12   Ht 6' (1.829 m)   Wt 204 lb (92.5 kg)   SpO2 97%   BMI 27.67 kg/m  Wt Readings from Last 3 Encounters:  05/30/19 204 lb (92.5 kg)  03/13/19 209 lb (94.8 kg)  12/02/18 200 lb (90.7 kg)    Diabetic Foot Exam - Simple   No data filed     Lab  Results  Component Value Date   WBC 5.1 05/30/2019   HGB 12.3 (L) 05/30/2019   HCT 38.0 (L) 05/30/2019   PLT 288.0 05/30/2019   GLUCOSE 99 05/30/2019   CHOL 132 05/30/2019   TRIG 156.0 (H) 05/30/2019   HDL 40.80 05/30/2019   LDLDIRECT 149.4 09/13/2007   LDLCALC 60 05/30/2019   ALT 31 05/30/2019   AST 26 05/30/2019   NA 136 05/30/2019   K 4.7 05/30/2019   CL 100 05/30/2019   CREATININE 1.04 05/30/2019   BUN 17 05/30/2019   CO2 29 05/30/2019   TSH 2.44 05/30/2019   PSA 0.86 05/30/2019   HGBA1C 6.1 05/30/2019    Lab Results  Component Value Date   TSH 2.44 05/30/2019   Lab Results  Component Value Date   WBC 5.1 05/30/2019   HGB 12.3 (L) 05/30/2019   HCT 38.0 (L) 05/30/2019   MCV 82.7 05/30/2019   PLT 288.0 05/30/2019   Lab Results  Component Value Date   NA 136 05/30/2019   K 4.7 05/30/2019   CO2 29 05/30/2019   GLUCOSE 99 05/30/2019   BUN 17 05/30/2019   CREATININE 1.04 05/30/2019   BILITOT 0.6 05/30/2019   ALKPHOS 102 05/30/2019   AST 26 05/30/2019   ALT 31 05/30/2019  PROT 7.4 05/30/2019   ALBUMIN 4.4 05/30/2019   CALCIUM 9.4 05/30/2019   ANIONGAP 8 03/11/2014   GFR 73.08 05/30/2019   Lab Results  Component Value Date   CHOL 132 05/30/2019   Lab Results  Component Value Date   HDL 40.80 05/30/2019   Lab Results  Component Value Date   LDLCALC 60 05/30/2019   Lab Results  Component Value Date   TRIG 156.0 (H) 05/30/2019   Lab Results  Component Value Date   CHOLHDL 3 05/30/2019   Lab Results  Component Value Date   HGBA1C 6.1 05/30/2019       Assessment & Plan:   Problem List Items Addressed This Visit    Hyperlipidemia, mixed    Tolerating statin, encouraged heart healthy diet, avoid trans fats, minimize simple carbs and saturated fats. Increase exercise as tolerated      Relevant Orders   Lipid panel (Completed)   Lipid panel   Essential hypertension    Well controlled, no changes to meds. Encouraged heart healthy diet  such as the DASH diet and exercise as tolerated.       Relevant Orders   CBC (Completed)   Comprehensive metabolic panel (Completed)   TSH (Completed)   CBC   Comprehensive metabolic panel   TSH   Kidney stone on left side    Passed a few kidney stones recently without pain after increasing his tea consumption. He has increased his water intake and is doing better.       Preventative health care    Patient encouraged to maintain heart healthy diet, regular exercise, adequate sleep. Consider daily probiotics. Take medications as prescribed. Labs ordered and reviewed.       Hyperglycemia    hgba1c acceptable, minimize simple carbs. Increase exercise as tolerated.      Relevant Orders   Hemoglobin A1c (Completed)   Hemoglobin A1c   Erectile dysfunction    With some nocturia at times. Check PSA       Other Visit Diagnoses    Nocturia    -  Primary   Relevant Orders   PSA (Completed)   Urinalysis (Completed)      I have discontinued Shawnte A. Waterworth's methylPREDNISolone. I am also having him maintain his aspirin EC, rOPINIRole, mupirocin ointment, buPROPion, meloxicam, zolpidem, atorvastatin, ezetimibe, metoprolol succinate, and losartan.  No orders of the defined types were placed in this encounter.    Penni Homans, MD

## 2019-06-01 ENCOUNTER — Telehealth: Payer: Self-pay | Admitting: Family Medicine

## 2019-06-01 ENCOUNTER — Encounter: Payer: Self-pay | Admitting: Family Medicine

## 2019-06-01 MED ORDER — ROPINIROLE HCL 0.5 MG PO TABS: 0.5000 mg | ORAL_TABLET | Freq: Every day | ORAL | 2 refills | Status: DC

## 2019-06-01 NOTE — Telephone Encounter (Signed)
Last written: 12/02/18 Last ov: 05/30/19 Next ov: 11/30/19 Contract: none UDS: 12/13/18

## 2019-06-01 NOTE — Telephone Encounter (Signed)
Medication: zolpidem (AMBIEN) 10 MG tablet [id number]  rOPINIRole (REQUIP) 0.5 MG tablet [id number]     Has the patient contacted their pharmacy? No. (If no, request that the patient contact the pharmacy for the refill.) (If yes, when and what did the pharmacy advise?)  Preferred Pharmacy (with phone number or street name):  [facility], [address], [place] [address]  Phone:  [phone] Fax:  [phone]  DEA #:  --     Agent: Please be advised that RX refills may take up to 3 business days. We ask that you follow-up with your pharmacy.

## 2019-06-01 NOTE — Telephone Encounter (Signed)
Request has been sent to Dr. Charlett Blake for Ambient the ropinirole has been sent in.

## 2019-06-02 NOTE — Telephone Encounter (Signed)
Patient notified thru mychart that medications was sent in.

## 2019-06-16 ENCOUNTER — Other Ambulatory Visit (INDEPENDENT_AMBULATORY_CARE_PROVIDER_SITE_OTHER): Payer: Managed Care, Other (non HMO)

## 2019-06-16 ENCOUNTER — Telehealth: Payer: Self-pay | Admitting: *Deleted

## 2019-06-16 DIAGNOSIS — D649 Anemia, unspecified: Secondary | ICD-10-CM | POA: Diagnosis not present

## 2019-06-16 LAB — FECAL OCCULT BLOOD, IMMUNOCHEMICAL: Fecal Occult Bld: POSITIVE — AB

## 2019-06-16 NOTE — Telephone Encounter (Signed)
Received call from Los Ybanez at [facility]. Reports positive IFOB.

## 2019-06-16 NOTE — Telephone Encounter (Signed)
Thanks addressed in lab result

## 2019-06-20 ENCOUNTER — Encounter: Payer: Self-pay | Admitting: Gastroenterology

## 2019-06-20 ENCOUNTER — Encounter: Payer: Self-pay | Admitting: Family Medicine

## 2019-06-20 DIAGNOSIS — R195 Other fecal abnormalities: Secondary | ICD-10-CM

## 2019-07-26 ENCOUNTER — Encounter: Payer: Self-pay | Admitting: Gastroenterology

## 2019-07-26 ENCOUNTER — Other Ambulatory Visit (INDEPENDENT_AMBULATORY_CARE_PROVIDER_SITE_OTHER): Payer: Managed Care, Other (non HMO)

## 2019-07-26 ENCOUNTER — Ambulatory Visit: Payer: Managed Care, Other (non HMO) | Admitting: Gastroenterology

## 2019-07-26 VITALS — BP 110/76 | HR 68 | Ht 72.0 in | Wt 208.0 lb

## 2019-07-26 DIAGNOSIS — K222 Esophageal obstruction: Secondary | ICD-10-CM | POA: Diagnosis not present

## 2019-07-26 DIAGNOSIS — R195 Other fecal abnormalities: Secondary | ICD-10-CM

## 2019-07-26 DIAGNOSIS — R1319 Other dysphagia: Secondary | ICD-10-CM

## 2019-07-26 DIAGNOSIS — R1310 Dysphagia, unspecified: Secondary | ICD-10-CM

## 2019-07-26 DIAGNOSIS — D500 Iron deficiency anemia secondary to blood loss (chronic): Secondary | ICD-10-CM

## 2019-07-26 LAB — CBC WITH DIFFERENTIAL/PLATELET
Basophils Absolute: 0 10*3/uL (ref 0.0–0.1)
Basophils Relative: 0.2 % (ref 0.0–3.0)
Eosinophils Absolute: 0.4 10*3/uL (ref 0.0–0.7)
Eosinophils Relative: 6.9 % — ABNORMAL HIGH (ref 0.0–5.0)
HCT: 40.7 % (ref 39.0–52.0)
Hemoglobin: 13.7 g/dL (ref 13.0–17.0)
Lymphocytes Relative: 34.8 % (ref 12.0–46.0)
Lymphs Abs: 2.2 10*3/uL (ref 0.7–4.0)
MCHC: 33.7 g/dL (ref 30.0–36.0)
MCV: 87.1 fl (ref 78.0–100.0)
Monocytes Absolute: 0.7 10*3/uL (ref 0.1–1.0)
Monocytes Relative: 11.3 % (ref 3.0–12.0)
Neutro Abs: 3 10*3/uL (ref 1.4–7.7)
Neutrophils Relative %: 46.8 % (ref 43.0–77.0)
Platelets: 240 10*3/uL (ref 150.0–400.0)
RBC: 4.68 Mil/uL (ref 4.22–5.81)
RDW: 19.8 % — ABNORMAL HIGH (ref 11.5–15.5)
WBC: 6.4 10*3/uL (ref 4.0–10.5)

## 2019-07-26 LAB — IBC + FERRITIN
Ferritin: 37.7 ng/mL (ref 22.0–322.0)
Iron: 52 ug/dL (ref 42–165)
Saturation Ratios: 12.9 % — ABNORMAL LOW (ref 20.0–50.0)
Transferrin: 287 mg/dL (ref 212.0–360.0)

## 2019-07-26 MED ORDER — PLENVU 140 G PO SOLR
140.0000 g | ORAL | 0 refills | Status: DC
Start: 2019-07-26 — End: 2019-09-12

## 2019-07-26 NOTE — Patient Instructions (Signed)
If you are age 59 or older, your body mass index should be between 23-30. Your Body mass index is 28.21 kg/m. If this is out of the aforementioned range listed, please consider follow up with your Primary Care Provider.  If you are age 55 or younger, your body mass index should be between 19-25. Your Body mass index is 28.21 kg/m. If this is out of the aformentioned range listed, please consider follow up with your Primary Care Provider.   You have been scheduled for an endoscopy and colonoscopy. Please follow the written instructions given to you at your visit today. Please pick up your prep supplies at the pharmacy within the next 1-3 days. If you use inhalers (even only as needed), please bring them with you on the day of your procedure.  Your provider has requested that you go to the basement level for lab work before leaving today. Press "B" on the elevator. The lab is located at the first door on the left as you exit the elevator.  Due to recent changes in healthcare laws, you may see the results of your imaging and laboratory studies on MyChart before your provider has had a chance to review them.  We understand that in some cases there may be results that are confusing or concerning to you. Not all laboratory results come back in the same time frame and the provider may be waiting for multiple results in order to interpret others.  Please give [place] 48 hours in order for your provider to thoroughly review all the results before contacting the office for clarification of your results.   It was a pleasure to see you today!  Dr. Loletha Carrow

## 2019-07-26 NOTE — Progress Notes (Addendum)
[facility] Consult Note:  History: Dakota [name] 07/26/2019  Referring provider: [name], MD  Reason for consult/chief complaint: Blood In Stools (Positive hemocult, patient has low iron?)   Subjective  HPI: EGD for dysphagia Feb 2018 - stricture dilated.  ?EoE, Bx only 13 eos Erosive gastritis and duodenitis, Bx neg for H pylori Last colonoscopy 2013  This is a very pleasant 59 year old man referred by his primary care provider for iron deficiency anemia and heme positive stool.  I saw him for previous upper endoscopy as noted above.  He was noted to have mildly decreased hemoglobin and low ferritin, and his stool card was positive for blood.  He donates blood every 8 to 10 weeks, and says when he did that most recently in May he was told that hemoglobin was normal. Dakota [name] has not had abdominal pain, black or bloody stool, change in bowel habits, nausea, vomiting, or weight loss. Within the last year he has had just a few episodes of solid food dysphagia, but nothing like what he had experienced before the endoscopic dilation.   ROS:  Review of Systems  Constitutional: Negative for appetite change and unexpected weight change.  HENT: Negative for mouth sores and voice change.   Eyes: Negative for pain and redness.  Respiratory: Negative for cough and shortness of breath.   Cardiovascular: Negative for chest pain and palpitations.  Genitourinary: Negative for dysuria and hematuria.  Musculoskeletal: Negative for arthralgias and myalgias.  Skin: Negative for pallor and rash.  Allergic/Immunologic: Positive for environmental allergies.  Neurological: Negative for weakness and headaches.  Hematological: Negative for adenopathy.     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   Kidney stone on left side 09/19/2013   Left knee pain 12/25/2012   Mumps as a child   Onychomycosis 09/30/2014   Overweight 12/16/2015   Preventative health care 09/24/2013     Past Surgical History: Past Surgical History:  Procedure Laterality Date   COLONOSCOPY     CORONARY STENT PLACEMENT  01-14-05   LITHOTRIPSY  2/16     Family History: Family History  Problem Relation Age of Onset   Multiple sclerosis Mother    Cancer Father        esophagus/ brain   Alcohol abuse Father        smoker   Esophageal cancer Father    Seizures Sister        AVM   COPD Sister    Alcohol abuse Sister        addiction, recovered   Heart disease Maternal Grandfather        MI   Cancer Paternal Aunt        leukemia   Colon cancer Neg Hx    Colon polyps Neg Hx    Rectal cancer Neg Hx    Stomach cancer Neg Hx     Social History: Social History   Socioeconomic History   Marital status: Married    Spouse  name: Not on file   Number of children: 2   Years of education: Not on file   Highest education level: Not on file  Occupational History   Occupation: real Investment banker, corporate  Tobacco Use   Smoking status: Former Smoker    Packs/day: 0.50    Years: 20.00    Pack years: 10.00    Types: Cigarettes    Quit date: 02/20/2015    Years since quitting: 4.4   Smokeless tobacco: Never Used  Substance and Sexual Activity   Alcohol use: Yes    Comment: 10 drinks per week   Drug use: No   Sexual activity: Yes    Partners: Female    Comment: wife, real Investment banker, corporate, no dairy, minimal fried foods  Other Topics Concern   Not on file  Social History Narrative   1 biological son   1 adopted child   3 step  children   Social Determinants of Radio broadcast assistant Strain:    Difficulty of Paying Living Expenses:   Food Insecurity:    Worried About Charity fundraiser in the Last Year:    Arboriculturist in the Last Year:   Transportation Needs:    Film/video editor (Medical):    Lack of Transportation (Non-Medical):   Physical Activity:    Days of Exercise per Week:    Minutes of Exercise per Session:   Stress:    Feeling of Stress :   Social Connections:    Frequency of Communication with Friends and Family:    Frequency of Social Gatherings with Friends and Family:    Attends Religious Services:    Active Member of Clubs or Organizations:    Attends Archivist Meetings:    Marital Status:     Allergies: Allergies  Allergen Reactions   Neosporin [Neomycin-Bacitracin Zn-Polymyx] Hives and Itching   Polysporin [Bacitracin-Polymyxin B] Hives and Itching   Amoxicillin Rash    Outpatient Meds: Current Outpatient Medications  Medication Sig Dispense Refill   aspirin EC 81 MG tablet Take 81 mg by mouth daily.     atorvastatin (LIPITOR) 80 MG tablet Take 1 tablet (80 mg total) by mouth daily at 6 PM. 90 tablet 3   buPROPion (WELLBUTRIN XL) 300 MG 24 hr tablet Take 1 tablet (300 mg total) by mouth daily. 90 tablet 3   meloxicam (MOBIC) 15 MG tablet Take 1 tablet (15 mg total) by mouth daily. 90 tablet 4   metoprolol succinate (TOPROL-XL) 50 MG 24 hr tablet Take 1 tablet (50 mg total) by mouth daily. Take with or immediately following a meal. 90 tablet 3   mupirocin ointment (BACTROBAN) 2 % Place 1 application into the nose 2 (two) times daily. 30 g 1   rOPINIRole (REQUIP) 0.5 MG tablet Take 1-2 tablets (0.5-1 mg total) by mouth at bedtime. 60 tablet 2   zolpidem (AMBIEN) 10 MG tablet TAKE ONE-HALF TO ONE TABLET BY MOUTH  AT BEDTIME AS NEEDED FOR SLEEP 30 tablet 4   ezetimibe (ZETIA) 10 MG tablet Take 1 tablet (10 mg total) by mouth daily.  90 tablet 3   losartan (COZAAR) 50 MG tablet Take 1 tablet (50 mg total) by mouth daily. 90 tablet 3   PEG-KCl-NaCl-NaSulf-Na Asc-C (PLENVU) 140 g SOLR Take 140 g by mouth as directed. BIN: [id number]; GROUP: [id number]; PCN: [id number]; ID: [id number]; SHOULD BE $50 1 each 0   No current facility-administered medications for this visit.  Mobic rare use Has also been taking an OTC iron tablets since the April lab work. ___________________________________________________________________ Objective   Exam:  BP 110/76    Pulse 68    Ht 6' (1.829 m)    Wt 208 lb (94.3 kg)    BMI 28.21 kg/m    General: Well-appearing  Eyes: sclera anicteric, no redness  ENT: oral mucosa moist without lesions, no cervical or supraclavicular lymphadenopathy  CV: RRR without murmur, S1/S2, no JVD, no peripheral edema  Resp: clear to auscultation bilaterally, normal RR and effort noted  GI: soft, no tenderness, with active bowel sounds. No guarding or palpable organomegaly noted.  Skin; warm and dry, no rash or jaundice noted  Neuro: awake, alert and oriented x 3. Normal gross motor function and fluent speech  Labs:  CBC Latest Ref Rng & Units 07/26/2019 05/30/2019 12/13/2018  WBC 4.0 - 10.5 K/uL 6.4 5.1 5.4  Hemoglobin 13.0 - 17.0 g/dL 13.7 12.3(L) 13.6  Hematocrit 39 - 52 % 40.7 38.0(L) 40.7  Platelets 150 - 400 K/uL 240.0 288.0 239.0   Iron/TIBC/Ferritin/ %Sat    Component Value Date/Time   IRON 52 07/26/2019 1453   FERRITIN 37.7 07/26/2019 1453   IRONPCTSAT 12.9 (L) 07/26/2019 1453   Positive stool card last month   Assessment: Encounter Diagnoses  Name Primary?   Iron deficiency anemia due to chronic blood loss Yes   Heme positive stool    Esophageal dysphagia    Esophageal stricture    While frequent phlebotomy may account for part or all of his iron deficiency anemia, it cannot explain the heme positive stool.  We must assume he has a source of occult GI blood loss contributing  to this.   Plan:  EGD and colonoscopy.  He is agreeable after discussion of procedure and risks.  The benefits and risks of the planned procedure were described in detail with the patient or (when appropriate) their health care proxy.  Risks were outlined as including, but not limited to, bleeding, infection, perforation, adverse medication reaction leading to cardiac or pulmonary decompensation, pancreatitis (if ERCP).  The limitation of incomplete mucosal visualization was also discussed.  No guarantees or warranties were given.  I sent in for a CBC and iron levels after he left the office today, with results as noted above.  Hemoglobin has normalized, ferritin has normalized with a persistently low iron saturation.  We will contact him with instructions to continue the iron at current dose until a week before his upcoming procedure.  Thank you for the courtesy of this consult.  Please call me with any questions or concerns.  Nelida Meuse III  CC: Referring provider noted above

## 2019-08-20 ENCOUNTER — Encounter: Payer: Self-pay | Admitting: Family Medicine

## 2019-08-21 ENCOUNTER — Other Ambulatory Visit: Payer: Self-pay | Admitting: Family Medicine

## 2019-08-21 MED ORDER — METHYLPREDNISOLONE 4 MG PO TABS: ORAL_TABLET | ORAL | 0 refills | Status: DC

## 2019-09-01 ENCOUNTER — Encounter: Payer: Self-pay | Admitting: Family Medicine

## 2019-09-01 ENCOUNTER — Other Ambulatory Visit: Payer: Self-pay | Admitting: Family Medicine

## 2019-09-01 MED ORDER — CEPHALEXIN 500 MG PO CAPS
500.0000 mg | ORAL_CAPSULE | Freq: Three times a day (TID) | ORAL | 0 refills | Status: DC
Start: 2019-09-01 — End: 2019-09-19

## 2019-09-01 NOTE — Progress Notes (Unsigned)
keflex

## 2019-09-05 ENCOUNTER — Encounter: Payer: Self-pay | Admitting: Gastroenterology

## 2019-09-06 ENCOUNTER — Encounter: Payer: Managed Care, Other (non HMO) | Admitting: Gastroenterology

## 2019-09-12 ENCOUNTER — Encounter: Payer: Self-pay | Admitting: Gastroenterology

## 2019-09-12 ENCOUNTER — Ambulatory Visit (AMBULATORY_SURGERY_CENTER): Payer: Managed Care, Other (non HMO) | Admitting: Gastroenterology

## 2019-09-12 ENCOUNTER — Other Ambulatory Visit: Payer: Self-pay

## 2019-09-12 VITALS — BP 151/97 | HR 69 | Temp 97.5°F | Resp 18 | Ht 72.0 in | Wt 208.0 lb

## 2019-09-12 DIAGNOSIS — K222 Esophageal obstruction: Secondary | ICD-10-CM

## 2019-09-12 DIAGNOSIS — R1310 Dysphagia, unspecified: Secondary | ICD-10-CM | POA: Diagnosis not present

## 2019-09-12 DIAGNOSIS — D500 Iron deficiency anemia secondary to blood loss (chronic): Secondary | ICD-10-CM | POA: Diagnosis not present

## 2019-09-12 DIAGNOSIS — R195 Other fecal abnormalities: Secondary | ICD-10-CM

## 2019-09-12 DIAGNOSIS — R1319 Other dysphagia: Secondary | ICD-10-CM

## 2019-09-12 HISTORY — PX: UPPER GASTROINTESTINAL ENDOSCOPY: SHX188

## 2019-09-12 MED ORDER — SODIUM CHLORIDE 0.9 % IV SOLN: 500.0000 mL | Freq: Once | INTRAVENOUS | Status: DC

## 2019-09-12 NOTE — Progress Notes (Signed)
Vitals CW  Pt's states no medical or surgical changes since previsit or office visit.  Patient states that it is fine to have the medical student in the procedure room.

## 2019-09-12 NOTE — Progress Notes (Signed)
Called to room to assist during endoscopic procedure.  Patient ID and intended procedure confirmed with present staff. Received instructions for my participation in the procedure from the performing physician.  

## 2019-09-12 NOTE — Op Note (Signed)
[place] Patient Name: Dakota [name] Procedure Date: 09/12/2019 2:52 PM MRN: [id number] Endoscopist: Mallie Mussel L. Loletha Carrow , MD Age: 59 Referring MD:  Date of Birth: 1960/09/22 Gender: Male Account #: [id number] Procedure:                Upper GI endoscopy Indications:              Unexplained iron deficiency anemia, Heme positive                            stool Medicines:                Monitored Anesthesia Care Procedure:                Pre-Anesthesia Assessment:                           - Prior to the procedure, a History and Physical                            was performed, and patient medications and                            allergies were reviewed. The patient's tolerance of                            previous anesthesia was also reviewed. The risks                            and benefits of the procedure and the sedation                            options and risks were discussed with the patient.                            All questions were answered, and informed consent                            was obtained. Prior Anticoagulants: The patient has                            taken no previous anticoagulant or antiplatelet                            agents except for aspirin. ASA Grade Assessment: II                            - A patient with mild systemic disease. After                            reviewing the risks and benefits, the patient was                            deemed in satisfactory condition to undergo the  procedure.                           After obtaining informed consent, the endoscope was                            passed under direct vision. Throughout the                            procedure, the patient's blood pressure, pulse, and                            oxygen saturations were monitored continuously. The                            Endoscope was introduced through the mouth, and                             advanced to the second part of duodenum. The upper                            GI endoscopy was accomplished without difficulty.                            The patient tolerated the procedure well. Scope In: Scope Out: Findings:                 One benign-appearing, intrinsic moderate stenosis                            was found at the gastroesophageal junction. This                            stenosis measured 1.2 cm (inner diameter) x less                            than one cm (in length). The stenosis was                            traversed. The scope was withdrawn. Dilation was                            performed with a Maloney dilator with no resistance                            at 52 Fr. The dilation site was examined following                            endoscope reinsertion and showed moderate mucosal                            disruption and moderate improvement in luminal                            narrowing.  The exam of the esophagus was otherwise normal.                           The stomach was normal.                           The cardia and gastric fundus were normal on                            retroflexion.                           The examined duodenum was normal. Complications:            No immediate complications. Estimated Blood Loss:     Estimated blood loss was minimal. Impression:               - Benign-appearing esophageal stenosis. Dilated.                           - Normal stomach.                           - Normal examined duodenum.                           - No specimens collected. Recommendation:           - Patient has a contact number available for                            emergencies. The signs and symptoms of potential                            delayed complications were discussed with the                            patient. Return to normal activities tomorrow.                            Written discharge  instructions were provided to the                            patient.                           - Resume previous diet.                           - Continue present medications.                           - See the other procedure note for documentation of                            additional recommendations. Bertice Risse L. Loletha Carrow, MD 09/12/2019 3:52:43 PM This report has been signed electronically.

## 2019-09-12 NOTE — Patient Instructions (Signed)
YOU HAD AN ENDOSCOPIC PROCEDURE TODAY AT THE [facility]:   Refer to the procedure report that was given to you for any specific questions about what was found during the examination.  If the procedure report does not answer your questions, please call your gastroenterologist to clarify.  If you requested that your care partner not be given the details of your procedure findings, then the procedure report has been included in a sealed envelope for you to review at your convenience later.  YOU SHOULD EXPECT: Some feelings of bloating in the abdomen. Passage of more gas than usual.  Walking can help get rid of the air that was put into your GI tract during the procedure and reduce the bloating. If you had a lower endoscopy (such as a colonoscopy or flexible sigmoidoscopy) you may notice spotting of blood in your stool or on the toilet paper. If you underwent a bowel prep for your procedure, you may not have a normal bowel movement for a few days.  Please Note:  You might notice some irritation and congestion in your nose or some drainage.  This is from the oxygen used during your procedure.  There is no need for concern and it should clear up in a day or so.  SYMPTOMS TO REPORT IMMEDIATELY:   Following lower endoscopy (colonoscopy or flexible sigmoidoscopy):  Excessive amounts of blood in the stool  Significant tenderness or worsening of abdominal pains  Swelling of the abdomen that is new, acute  Fever of 100F or higher   Following upper endoscopy (EGD)  Vomiting of blood or coffee ground material  New chest pain or pain under the shoulder blades  Painful or persistently difficult swallowing  New shortness of breath  Fever of 100F or higher  Black, tarry-looking stools  For urgent or emergent issues, a gastroenterologist can be reached at any hour by calling [phone]. Do not use MyChart messaging for urgent concerns.    DIET:  We do recommend a small meal at first, but  then you may proceed to your regular diet.  Drink plenty of fluids but you should avoid alcoholic beverages for 24 hours.  ACTIVITY:  You should plan to take it easy for the rest of today and you should NOT DRIVE or use heavy machinery until tomorrow (because of the sedation medicines used during the test).    FOLLOW UP: Our staff will call the number listed on your records 48-72 hours following your procedure to check on you and address any questions or concerns that you may have regarding the information given to you following your procedure. If we do not reach you, we will leave a message.  We will attempt to reach you two times.  During this call, we will ask if you have developed any symptoms of COVID 19. If you develop any symptoms (ie: fever, flu-like symptoms, shortness of breath, cough etc.) before then, please call [phone].  If you test positive for Covid 19 in the 2 weeks post procedure, please call and report this information to us.    If any biopsies were taken you will be contacted by phone or by letter within the next 1-3 weeks.  Please call us at [phone] if you have not heard about the biopsies in 3 weeks.    SIGNATURES/CONFIDENTIALITY: You and/or your care partner have signed paperwork which will be entered into your electronic medical record.  These signatures attest to the fact that that the information above on   your After Visit Summary has been reviewed and is understood.  Full responsibility of the confidentiality of this discharge information lies with you and/or your care-partner. 

## 2019-09-12 NOTE — Op Note (Signed)
[facility] Patient Name: Dakota [name] Procedure Date: 09/12/2019 2:52 PM MRN: [id number] Endoscopist: Mallie Mussel L. Loletha Carrow , MD Age: 59 Referring MD:  Date of Birth: 03-26-1960 Gender: Male Account #: [id number] Procedure:                Colonoscopy Indications:              Heme positive stool, Unexplained iron deficiency                            anemia (Hgb 12.3, iron level low but with low                            saturation. Hgb and iron sat have normalized on                            oral iron) Medicines:                Monitored Anesthesia Care Procedure:                Pre-Anesthesia Assessment:                           - Prior to the procedure, a History and Physical                            was performed, and patient medications and                            allergies were reviewed. The patient's tolerance of                            previous anesthesia was also reviewed. The risks                            and benefits of the procedure and the sedation                            options and risks were discussed with the patient.                            All questions were answered, and informed consent                            was obtained. Prior Anticoagulants: The patient has                            taken no previous anticoagulant or antiplatelet                            agents except for aspirin. ASA Grade Assessment: II                            - A patient with mild systemic disease. After  reviewing the risks and benefits, the patient was                            deemed in satisfactory condition to undergo the                            procedure.                           After obtaining informed consent, the colonoscope                            was passed under direct vision. Throughout the                            procedure, the patient's blood pressure, pulse, and                            oxygen  saturations were monitored continuously. The                            Colonoscope was introduced through the anus and                            advanced to the the descending colon. The                            colonoscopy was extremely difficult due to a                            tortuous colon. The patient tolerated the procedure                            fairly well. The quality of the bowel preparation                            was good. The rectum and descending colon were                            photographed. Scope In: 3:11:17 PM Scope Out: 3:20:48 PM Total Procedure Duration: 0 hours 9 minutes 31 seconds  Findings:                 The perianal and digital rectal examinations were                            normal.                           The distal sigmoid colon was significantly                            tortuous. It could not be traversed with an adult  or pediatric colonoscope. Patient was turned to the                            supine position, abdominal pressure applied and                            water inflation used. A gastroscope was inserted                            and passed through this area to the descending                            colon, but could not be advanced further (shorter                            scope). Decompression of insufflated air was                            successful with this scope.                           The exam was otherwise without abnormality. Complications:            No immediate complications. Estimated Blood Loss:     Estimated blood loss: none. Impression:               - Tortuous colon.                           - The examination was otherwise normal.                           - No specimens collected. Recommendation:           - Patient has a contact number available for                            emergencies. The signs and symptoms of potential                            delayed  complications were discussed with the                            patient. Return to normal activities tomorrow.                            Written discharge instructions were provided to the                            patient.                           - Resume previous diet.                           - Continue present medications.                           -  Repeat colonoscopy at appointment to be                            scheduled. Will be done in hospital-based                            outpatient endoscopy department with advanced                            endoscopist.                           - Stop oral iron                           - See the other procedure note for documentation of                            additional recommendations. Mariangel Ringley L. Loletha Carrow, MD 09/12/2019 3:49:59 PM This report has been signed electronically.

## 2019-09-14 ENCOUNTER — Telehealth: Payer: Self-pay

## 2019-09-14 ENCOUNTER — Encounter: Payer: Managed Care, Other (non HMO) | Admitting: Gastroenterology

## 2019-09-14 NOTE — Telephone Encounter (Signed)
  Follow up Call-  Call back number 09/12/2019  Post procedure Call Back phone  # [phone]  Permission to leave phone message Yes  Some recent data might be hidden     Patient questions:  Do you have a fever, pain , or abdominal swelling? No. Pain Score  0 *  Have you tolerated food without any problems? Yes.    Have you been able to return to your normal activities? Yes.    Do you have any questions about your discharge instructions: Diet   No. Medications  No. Follow up visit  No.  Do you have questions or concerns about your Care? No.  Actions: * If pain score is 4 or above: No action needed, pain <4.  1. Have you developed a fever since your procedure? no  2.   Have you had an respiratory symptoms (SOB or cough) since your procedure? no  3.   Have you tested positive for COVID 19 since your procedure no  4.   Have you had any family members/close contacts diagnosed with the COVID 19 since your procedure?  no   If yes to any of these questions please route to Joylene John, RN and Joella Prince, RN

## 2019-09-14 NOTE — Telephone Encounter (Signed)
  Follow up Call-  Call back number 09/12/2019  Post procedure Call Back phone  # [phone]  Permission to leave phone message Yes  Some recent data might be hidden     Left message

## 2019-09-14 NOTE — Progress Notes (Signed)
Spoke with pt and he states he is willing to have another attempt at colon with Dr. Rush Landmark.

## 2019-09-19 ENCOUNTER — Encounter: Payer: Self-pay | Admitting: Family Medicine

## 2019-09-19 ENCOUNTER — Other Ambulatory Visit: Payer: Self-pay | Admitting: Family Medicine

## 2019-09-19 MED ORDER — CEPHALEXIN 500 MG PO CAPS
500.0000 mg | ORAL_CAPSULE | Freq: Four times a day (QID) | ORAL | 0 refills | Status: DC
Start: 2019-09-19 — End: 2019-12-04

## 2019-10-10 NOTE — Telephone Encounter (Signed)
Gabe,    I saw this patient for IDA and heme positive stool.  Last month he was scheduled for EGD and colonoscopy.  EGD done,but colonoscope could not be advanced beyond sigmoid colon due to marked tortuosity.  (Initial hemoglobin 12.3, normal iron level but low saturation.  Hemoglobin 9 iron sat normalized on oral iron) Would you be willing to make an attempt with the ultraslim colonoscope as a direct book procedure?  - HD

## 2019-10-11 ENCOUNTER — Telehealth: Payer: Self-pay

## 2019-10-11 ENCOUNTER — Other Ambulatory Visit: Payer: Self-pay

## 2019-10-11 DIAGNOSIS — D500 Iron deficiency anemia secondary to blood loss (chronic): Secondary | ICD-10-CM

## 2019-10-11 DIAGNOSIS — R195 Other fecal abnormalities: Secondary | ICD-10-CM

## 2019-10-11 DIAGNOSIS — D509 Iron deficiency anemia, unspecified: Secondary | ICD-10-CM

## 2019-10-11 NOTE — Telephone Encounter (Signed)
[name], Telford Nab., MD  Doran Stabler, MD; Timothy Lasso, RN This will be a hospital-based procedure. Thanks. [name]       Previous Messages  Gastroentorolgy question  Danis, Kirke Corin, MD  Bonita Quin A 17 hours ago (4:58 PM)   [name],    Thank you for reaching out to [place].  My partner Dr. Rush Landmark has agreed to do the colonoscopy and his nurse will call you to make arrangements.  It must be done in the hospital outpatient endoscopy department, and it is likely to be a couple months from now due to busy schedule.  - Dr. Hilton Sinclair, Telford Nab., MD  You; Nelida Meuse III, MD 17 hours ago (4:39 PM)   This will be a hospital-based procedure. Thanks. Noble text   [name], Telford Nab., MD  You; Nelida Meuse III, MD 17 hours ago (4:38 PM)   HD, Happy to be available to help. Sathvik Tiedt, please move forward with finding the patient a colonoscopy slot (60-minute direct procedure) no clinic visit. Have ultraslim colonoscope available. Please let HD and I know what date the patient chooses. Thanks. GM   Message text   Danis, Kirke Corin, MD routed conversation to [name], Telford Nab., MD Yesterday (9:41 AM)  Doran Stabler, MD Yesterday (9:40 AM)     Dakota [name],    I saw this patient for IDA and heme positive stool.  Last month he was scheduled for EGD and colonoscopy.  EGD done,but colonoscope could not be advanced beyond sigmoid colon due to marked tortuosity.  (Initial hemoglobin 12.3, normal iron level but low saturation.  Hemoglobin 9 iron sat normalized on oral iron) Would you be willing to make an attempt with the ultraslim colonoscope as a direct book procedure?  - HD      Documentation   Yevette Edwards, RN routed conversation to Doran Stabler, MD Yesterday (8:18 AM)  [name], MD 2 days ago   I had a colonoscopy a while back and Dr. Loletha Carrow was going to refer me to another doctor for a  special colonoscopy. I have not heard anything from the other doctor's office and really need to get this done before the end of the year if I am going to do it since I have met my deductible for the year. Please let me know what to do to get this moving. Thanks!

## 2019-10-11 NOTE — Telephone Encounter (Signed)
Colon scheduled with Dr Rush Landmark on 12/04/19 at [facility] scheduled for 10/28.    Colon scheduled, pt instructed and medications reviewed.  Patient instructions mailed to home.  Patient to call with any questions or concerns.

## 2019-10-19 ENCOUNTER — Other Ambulatory Visit: Payer: Self-pay | Admitting: Family Medicine

## 2019-10-31 ENCOUNTER — Other Ambulatory Visit: Payer: Self-pay | Admitting: Family Medicine

## 2019-11-08 ENCOUNTER — Telehealth: Payer: Self-pay

## 2019-11-08 NOTE — Telephone Encounter (Signed)
The pt has been scheduled and instructed.  

## 2019-11-08 NOTE — Telephone Encounter (Signed)
-----   Message from Timothy Lasso, RN sent at 09/14/2019  4:40 PM EDT -----  ----- Message ----- From: Algernon Huxley, RN Sent: 09/14/2019   4:32 PM EDT To: Timothy Lasso, RN   ----- Message ----- From: Doran Stabler, MD Sent: 09/14/2019   4:17 PM EDT To: Algernon Huxley, RN  This was an incomplete colonoscopy in the [facility] due to tortuous left colon.  I offered the patient a repeat colonoscopy attempt with Dr. Rush Landmark at the hospital, but he wanted to consider it further. Dr. Rush Landmark agreed to do so.  It would likely be in a couple months due to Dr. Donneta Romberg schedule.Please contact Dakota [name] and ask if he would like to proceed. If so, then please let Pavielle Biggar know since it can be directly booked on hospital schedule.  - HD - HD

## 2019-11-20 ENCOUNTER — Encounter: Payer: Self-pay | Admitting: Family Medicine

## 2019-11-20 NOTE — Telephone Encounter (Signed)
Spoke with patient and he states he has rescheduled his covid test for that date and will keep his original appointment as scheduled.

## 2019-11-22 NOTE — Addendum Note (Signed)
Addended by: Kelle Darting A on: 11/22/2019 11:01 AM   Modules accepted: Orders

## 2019-11-24 ENCOUNTER — Other Ambulatory Visit: Payer: Self-pay

## 2019-11-24 ENCOUNTER — Other Ambulatory Visit: Payer: Managed Care, Other (non HMO)

## 2019-11-24 DIAGNOSIS — E782 Mixed hyperlipidemia: Secondary | ICD-10-CM

## 2019-11-24 DIAGNOSIS — I10 Essential (primary) hypertension: Secondary | ICD-10-CM

## 2019-11-24 DIAGNOSIS — R739 Hyperglycemia, unspecified: Secondary | ICD-10-CM

## 2019-11-25 LAB — COMPREHENSIVE METABOLIC PANEL
AG Ratio: 1.6 (calc) (ref 1.0–2.5)
ALT: 23 U/L (ref 9–46)
AST: 19 U/L (ref 10–35)
Albumin: 4.4 g/dL (ref 3.6–5.1)
Alkaline phosphatase (APISO): 76 U/L (ref 35–144)
BUN: 20 mg/dL (ref 7–25)
CO2: 28 mmol/L (ref 20–32)
Calcium: 9.5 mg/dL (ref 8.6–10.3)
Chloride: 102 mmol/L (ref 98–110)
Creat: 1.01 mg/dL (ref 0.70–1.33)
Globulin: 2.8 g/dL (calc) (ref 1.9–3.7)
Glucose, Bld: 98 mg/dL (ref 65–99)
Potassium: 4.6 mmol/L (ref 3.5–5.3)
Sodium: 137 mmol/L (ref 135–146)
Total Bilirubin: 0.9 mg/dL (ref 0.2–1.2)
Total Protein: 7.2 g/dL (ref 6.1–8.1)

## 2019-11-25 LAB — LIPID PANEL
Cholesterol: 112 mg/dL (ref <200)
HDL: 40 mg/dL (ref >=40)
LDL Cholesterol (Calc): 51 mg/dL (calc)
Non-HDL Cholesterol (Calc): 72 mg/dL (calc) (ref <130)
Total CHOL/HDL Ratio: 2.8 (calc) (ref <5.0)
Triglycerides: 119 mg/dL (ref <150)

## 2019-11-25 LAB — CBC
HCT: 42.2 % (ref 38.5–50.0)
Hemoglobin: 13.9 g/dL (ref 13.2–17.1)
MCH: 30.2 pg (ref 27.0–33.0)
MCHC: 32.9 g/dL (ref 32.0–36.0)
MCV: 91.5 fL (ref 80.0–100.0)
MPV: 11.1 fL (ref 7.5–12.5)
Platelets: 226 10*3/uL (ref 140–400)
RBC: 4.61 10*6/uL (ref 4.20–5.80)
RDW: 12.9 % (ref 11.0–15.0)
WBC: 6 10*3/uL (ref 3.8–10.8)

## 2019-11-25 LAB — HEMOGLOBIN A1C
Hgb A1c MFr Bld: 6 % of total Hgb — ABNORMAL HIGH (ref <5.7)
Mean Plasma Glucose: 126 (calc)
eAG (mmol/L): 7 (calc)

## 2019-11-25 LAB — TSH: TSH: 2.83 mIU/L (ref 0.40–4.50)

## 2019-11-30 ENCOUNTER — Ambulatory Visit: Payer: Managed Care, Other (non HMO) | Admitting: Family Medicine

## 2019-11-30 ENCOUNTER — Other Ambulatory Visit: Payer: Self-pay

## 2019-11-30 ENCOUNTER — Encounter: Payer: Self-pay | Admitting: Family Medicine

## 2019-11-30 ENCOUNTER — Other Ambulatory Visit (HOSPITAL_COMMUNITY)
Admission: RE | Admit: 2019-11-30 | Discharge: 2019-11-30 | Disposition: A | Discharge status: Home / Self Care | Payer: Managed Care, Other (non HMO) | Source: Ambulatory Visit | Attending: Gastroenterology | Admitting: Gastroenterology

## 2019-11-30 ENCOUNTER — Other Ambulatory Visit (HOSPITAL_COMMUNITY): Payer: Managed Care, Other (non HMO)

## 2019-11-30 VITALS — BP 124/78 | HR 58 | Temp 97.7°F | Wt 212.4 lb

## 2019-11-30 DIAGNOSIS — M25561 Pain in right knee: Secondary | ICD-10-CM

## 2019-11-30 DIAGNOSIS — Z01812 Encounter for preprocedural laboratory examination: Secondary | ICD-10-CM | POA: Insufficient documentation

## 2019-11-30 DIAGNOSIS — E782 Mixed hyperlipidemia: Secondary | ICD-10-CM | POA: Diagnosis not present

## 2019-11-30 DIAGNOSIS — N529 Male erectile dysfunction, unspecified: Secondary | ICD-10-CM

## 2019-11-30 DIAGNOSIS — I10 Essential (primary) hypertension: Secondary | ICD-10-CM | POA: Diagnosis not present

## 2019-11-30 DIAGNOSIS — Z0000 Encounter for general adult medical examination without abnormal findings: Secondary | ICD-10-CM

## 2019-11-30 DIAGNOSIS — Z23 Encounter for immunization: Secondary | ICD-10-CM

## 2019-11-30 DIAGNOSIS — R739 Hyperglycemia, unspecified: Secondary | ICD-10-CM | POA: Diagnosis not present

## 2019-11-30 DIAGNOSIS — I2510 Atherosclerotic heart disease of native coronary artery without angina pectoris: Secondary | ICD-10-CM | POA: Diagnosis not present

## 2019-11-30 DIAGNOSIS — L409 Psoriasis, unspecified: Secondary | ICD-10-CM

## 2019-11-30 DIAGNOSIS — Z20822 Contact with and (suspected) exposure to covid-19: Secondary | ICD-10-CM | POA: Insufficient documentation

## 2019-11-30 DIAGNOSIS — M25562 Pain in left knee: Secondary | ICD-10-CM

## 2019-11-30 LAB — SARS CORONAVIRUS 2 (TAT 6-24 HRS): SARS Coronavirus 2: NEGATIVE

## 2019-11-30 NOTE — Assessment & Plan Note (Signed)
Well controlled, no changes to meds. Encouraged heart healthy diet such as the DASH diet and exercise as tolerated.  °

## 2019-11-30 NOTE — Assessment & Plan Note (Addendum)
hgba1c acceptable, minimize simple carbs. Increase exercise as tolerated.  

## 2019-11-30 NOTE — Assessment & Plan Note (Signed)
Tolerating statin, encouraged heart healthy diet, avoid trans fats, minimize simple carbs and saturated fats. Increase exercise

## 2019-11-30 NOTE — Patient Instructions (Signed)
Psoriasis Psoriasis is a long-term (chronic) skin condition. It occurs because your immune system causes skin cells to form too quickly. As a result, too many skin cells grow and create raised, red patches (plaques) that often look silvery on your skin. Plaques may show up anywhere on your body. They can be any size or shape. Symptoms of this condition range from mild to very severe. Psoriasis cannot be passed from one person to another (is not contagious). Sometimes, the symptoms go away and then come back again. What are the causes? The cause of psoriasis is not known, but certain factors can make the condition worse. These include:  Damage or trauma to the skin, such as cuts, scrapes, sunburn, and dryness.  Not enough exposure to sunlight.  Certain medicines.  Alcohol.  Tobacco use.  Stress.  Infections caused by bacteria or viruses. What increases the risk? You are more likely to develop this condition if you:  Have a family history of psoriasis.  Are obese.  Are 59-60 years old.  Are taking certain medicines. What are the signs or symptoms? There are different types of psoriasis. You can have more than one type of psoriasis during your life. The types are:  Plaque. This is the most common.  Guttate. This is also called eruptive psoriasis.  Inverse.  Pustular.  Erythrodermic.  Sebopsoriasis.  Psoriatic arthritis. Each type of psoriasis has different symptoms.  Plaque psoriasis symptoms include red, raised plaques with a silvery-white coating (scale). These plaques may be itchy. Your nails may be pitted and crumbly or fall off.  Guttate psoriasis symptoms include small red spots that often show up on your trunk, arms, and legs. These spots may develop after you have been sick, especially with strep throat.  Inverse psoriasis symptoms include plaques in your underarm area, under your breasts, or on your genitals, groin, or buttocks.  Pustular psoriasis symptoms  include pus-filled bumps that are painful, red, and swollen on the palms of your hands or the soles of your feet. You also may feel exhausted, feverish, weak, or have no appetite.  Erythrodermic psoriasis symptoms include bright red skin that may look burned. You may have a fast heartbeat and a body temperature that is too high or too low. You may be itchy or in pain.  Sebopsoriasis symptoms include red plaques that have a greasy coating, and are often on your scalp, forehead, and face.  Psoriatic arthritis causes swollen, painful joints along with scaly skin plaques. How is this diagnosed? This condition is diagnosed based on your symptoms, family history, and a physical exam.  You may also be referred to a health care provider who specializes in skin diseases (dermatologist).  Your health care provider may remove a tissue sample (biopsy) for testing. How is this treated? There is no cure for this condition, but treatment can help manage it. Goals of treatment include:  Helping your skin heal.  Reducing itching and inflammation.  Slowing the growth of new skin cells.  Helping your immune system respond better to your skin. Treatment varies, depending on the severity of your condition. This condition may be treated by:  Creams or ointments to help with symptoms.  Ultraviolet ray exposure (light therapy or phototherapy). This may include natural sunlight or light therapy in a medical office.  Medicines (systemic therapy). These medicines can help your body better manage skin cell turnover and inflammation. Medicines may be given in the form of pills or injections. They may be used along with light  therapy or ointments. You may also get antibiotic medicines if you have an infection. Follow these instructions at home: [place] your skin as needed. Only use moisturizers that have been approved by your health care provider.  Apply cool, wet cloths (cold compresses) to the  affected areas.  Do not use a hot tub or take hot showers. Take lukewarm showers and baths.  Do not scratch your skin. Lifestyle   Do not use any products that contain nicotine or tobacco, such as cigarettes, e-cigarettes, and chewing tobacco. If you need help quitting, ask your health care provider.  Use techniques for stress reduction, such as meditation or yoga.  Maintain a healthy weight. Follow instructions from your health care provider for weight control. These may include dietary restrictions.  Get safe exposure to the sun as told by your health care provider. This may include spending regular intervals of time outdoors in sunlight. Do not get sunburned.  Consider joining a psoriasis support group. Medicines  Take or use over-the-counter and prescription medicines only as told by your health care provider.  If you were prescribed an antibiotic medicine, take it as told by your health care provider. Do not stop using the antibiotic even if you start to feel better. Alcohol use  Limit how much you use: ? 0-1 drink a day for women. ? 0-2 drinks a day for men.  Be aware of how much alcohol is in your drink. In the [place] one drink equals one 12 oz bottle of beer (355 mL), one 5 oz glass of wine (148 mL), or one 1 oz glass of hard liquor (44 mL). General instructions  Keep a journal to help track what triggers an outbreak. Try to avoid any triggers.  See a counselor if feelings of sadness, frustration, and hopelessness about your condition are interfering with your work and relationships.  Keep all follow-up visits as told by your health care provider. This is important. Contact a health care provider if:  You have a fever.  Your pain gets worse.  You have increasing redness or warmth in the affected areas.  You have new or worsening pain or stiffness in your joints.  Your nails start to break easily or pull away from the nail bed.  You feel  depressed. Summary  Psoriasis is a long-term (chronic) skin condition. Patches (plaques) may show up anywhere on your body.  There is no cure for this condition, but treatment can help manage it. Treatment varies, depending on the severity of your condition.  Keep a journal to track what triggers an outbreak. Try to avoid any triggers.  Take or use over-the-counter and prescription medicines only as told by your health care provider.  Keep all follow-up visits as told by your health care provider. This is important. This information is not intended to replace advice given to you by your health care provider. Make sure you discuss any questions you have with your health care provider. Document Revised: 11/23/2017 Document Reviewed: 11/23/2017 Elsevier Patient Education  2020 [employer].

## 2019-11-30 NOTE — Assessment & Plan Note (Signed)
Stiff in knees in am gets better if he is up awhile. Likely OA but if worsens will proceed with imaging and consideration of psoriasis making his pain worse

## 2019-11-30 NOTE — Progress Notes (Signed)
Subjective:    Patient ID: Dakota [name], male    DOB: [birth year], 59 y.o.   MRN: [id number]  Chief Complaint  Patient presents with  . Follow-up    6 month    HPI Patient is in today for follow up on chronic medical concerns. No recent febrile illness or hospitalizations. He is following with dermatology for his psoriasis and is largely controlled with topicals so far. No acute concerns although he does endorse trouble with pain in both knees with some stiffness and worse first thing in the morning and after prolonged sitting. No recent trauma or fall. Denies CP/palp/SOB/HA/congestion/fevers/GI or GU c/o. Taking meds as prescribed  Past Medical History:  Diagnosis Date  . Allergy    cats  . Anxiety   . Asthma    childhood  . CAD (coronary artery disease)   . Chicken pox as a child  . Depression 12/25/2012  . Dysphagia 12/16/2015  . H/O tobacco use, presenting hazards to health 02/11/2011   1/2 ppd encouraged Has tried nicotine replacements and Chantix in past   . Hyperlipidemia   . Hypertension   . Insomnia 06/11/2013  . 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 Kidney stone on left side 09/19/2013  . Left knee pain 12/25/2012  . Mumps as a child  . Onychomycosis 09/30/2014  . Overweight 12/16/2015  . Preventative health care 09/24/2013    Past Surgical History:  Procedure Laterality Date  . COLONOSCOPY    . CORONARY STENT PLACEMENT  01-14-05  . LITHOTRIPSY  2/16    Family History  Problem Relation Age of Onset  . Multiple sclerosis Mother   . Cancer Father        esophagus/ brain  . Alcohol abuse Father        smoker  . Esophageal cancer Father   . Seizures Sister        AVM  . COPD Sister   . Alcohol abuse Sister        addiction, recovered  . Heart disease Maternal Grandfather        MI  . Cancer Paternal Aunt        leukemia  . Colon cancer Neg Hx   . Colon polyps Neg Hx   . Rectal cancer Neg Hx   . Stomach cancer Neg Hx     Social History    Socioeconomic History  . Marital status: Married    Spouse name: Not on file  . Number of children: 2  . Years of education: Not on file  . Highest education level: Not on file  Occupational History  . Occupation: Clinical cytogeneticist  Tobacco Use  . Smoking status: Former Smoker    Packs/day: 0.50    Years: 20.00    Pack years: 10.00    Types: Cigarettes    Quit date: 02/20/2015    Years since quitting: 4.7  . Smokeless tobacco: Never Used  Vaping Use  . Vaping Use: Every day  Substance and Sexual Activity  . Alcohol use: Yes    Comment: 10 drinks per week  . Drug use: No  . Sexual activity: Yes    Partners: Female    Comment: wife, real Investment banker, corporate, no dairy, minimal fried foods  Other Topics Concern  . Not on file  Social History Narrative   1 biological son   1 adopted child   3 step children   Social Determinants of Health   Financial Resource Strain:   .  Difficulty of Paying Living Expenses: Not on file  Food Insecurity:   . Worried About Charity fundraiser in the Last Year: Not on file  . Ran Out of Food in the Last Year: Not on file  Transportation Needs:   . Lack of Transportation (Medical): Not on file  . Lack of Transportation (Non-Medical): Not on file  Physical Activity:   . Days of Exercise per Week: Not on file  . Minutes of Exercise per Session: Not on file  Stress:   . Feeling of Stress : Not on file  Social Connections:   . Frequency of Communication with Friends and Family: Not on file  . Frequency of Social Gatherings with Friends and Family: Not on file  . Attends Religious Services: Not on file  . Active Member of Clubs or Organizations: Not on file  . Attends Archivist Meetings: Not on file  . Marital Status: Not on file  Intimate Partner Violence:   . Fear of Current or Ex-Partner: Not on file  . Emotionally Abused: Not on file  . Physically Abused: Not on file  . Sexually Abused: Not on file    Outpatient  Medications Prior to Visit  Medication Sig Dispense Refill  . atorvastatin (LIPITOR) 80 MG tablet Take 1 tablet (80 mg total) by mouth daily at 6 PM. 90 tablet 3  . buPROPion (WELLBUTRIN XL) 300 MG 24 hr tablet TAKE 1 TABLET BY MOUTH DAILY (Patient taking differently: Take 300 mg by mouth daily. ) 90 tablet 3  . Calcipotriene-Betameth Diprop (ENSTILAR) 0.005-0.064 % FOAM Apply 1 application topically daily as needed (psoriasis).    . cephALEXin (KEFLEX) 500 MG capsule Take 1 capsule (500 mg total) by mouth 4 (four) times daily. 40 capsule 0  . cetirizine (ZYRTEC) 10 MG tablet Take 10 mg by mouth daily as needed for allergies.    . clobetasol ointment (TEMOVATE) 0.73 % Apply 1 application topically 2 (two) times daily as needed (psoriasis).    . Coenzyme Q10 300 MG CAPS Take 300 mg by mouth every other day.    . ibuprofen (ADVIL) 200 MG tablet Take 400-600 mg by mouth every 8 (eight) hours as needed for headache or moderate pain.    Marland Kitchen lansoprazole (PREVACID) 15 MG capsule Take 15 mg by mouth daily as needed (acid reflux).    . Melatonin 10 MG CAPS Take 10 mg by mouth at bedtime as needed (sleep).    . meloxicam (MOBIC) 15 MG tablet Take 1 tablet (15 mg total) by mouth daily. (Patient taking differently: Take 15 mg by mouth daily as needed for pain. ) 90 tablet 4  . methylPREDNISolone (MEDROL) 4 MG tablet 5 tab po qd X 1d then 4 tab po qd X 1d then 3 tab po qd X 1d then 2 tab po qd then 1 tab po qd 15 tablet 0  . metoprolol succinate (TOPROL-XL) 50 MG 24 hr tablet Take 1 tablet (50 mg total) by mouth daily. Take with or immediately following a meal. 90 tablet 3  . Multiple Vitamin (MULTIVITAMIN WITH MINERALS) TABS tablet Take 1 tablet by mouth daily.    . mupirocin ointment (BACTROBAN) 2 % Place 1 application into the nose 2 (two) times daily. (Patient taking differently: Place 1 application into the nose 2 (two) times daily as needed (wound care). ) 30 g 1  . Probiotic Product (PROBIOTIC PO) Take 1  capsule by mouth every other day.    Marland Kitchen rOPINIRole (REQUIP)  0.5 MG tablet Take 1-2 tablets (0.5-1 mg total) by mouth at bedtime. (Patient taking differently: Take 0.5-1 mg by mouth at bedtime as needed (restless legs). ) 60 tablet 2  . zolpidem (AMBIEN) 10 MG tablet TAKE 1/2 TO 1 TABLET BY MOUTH AT BEDTIME AS NEEDED FOR SLEEP (Patient taking differently: Take 5-10 mg by mouth at bedtime as needed for sleep. ) 30 tablet 1  . ezetimibe (ZETIA) 10 MG tablet Take 1 tablet (10 mg total) by mouth daily. (Patient taking differently: Take 10 mg by mouth every other day. ) 90 tablet 3  . losartan (COZAAR) 50 MG tablet Take 1 tablet (50 mg total) by mouth daily. 90 tablet 3   No facility-administered medications prior to visit.    Allergies  Allergen Reactions  . Neosporin [Neomycin-Bacitracin Zn-Polymyx] Hives and Itching  . Polysporin [Bacitracin-Polymyxin B] Hives and Itching  . Amoxicillin Itching and Rash    Review of Systems  Constitutional: Negative for fever and malaise/fatigue.  HENT: Negative for congestion.   Eyes: Negative for blurred vision.  Respiratory: Negative for shortness of breath.   Cardiovascular: Negative for chest pain, palpitations and leg swelling.  Gastrointestinal: Negative for abdominal pain, blood in stool and nausea.  Genitourinary: Negative for dysuria and frequency.  Musculoskeletal: Positive for joint pain. Negative for falls.  Skin: Negative for rash.  Neurological: Negative for dizziness, loss of consciousness and headaches.  Endo/Heme/Allergies: Negative for environmental allergies.  Psychiatric/Behavioral: Negative for depression. The patient is not nervous/anxious.        Objective:    Physical Exam Vitals and nursing note reviewed.  Constitutional:      General: He is not in acute distress.    Appearance: He is well-developed.  HENT:     Head: Normocephalic and atraumatic.     Nose: Nose normal.  Eyes:     General:        Right eye: No  discharge.        Left eye: No discharge.  Cardiovascular:     Rate and Rhythm: Normal rate and regular rhythm.     Heart sounds: No murmur heard.   Pulmonary:     Effort: Pulmonary effort is normal.     Breath sounds: Normal breath sounds.  Abdominal:     General: Bowel sounds are normal.     Palpations: Abdomen is soft.     Tenderness: There is no abdominal tenderness.  Musculoskeletal:     Cervical back: Normal range of motion and neck supple.  Skin:    General: Skin is warm and dry.  Neurological:     Mental Status: He is alert and oriented to person, place, and time.     BP 124/78 (BP Location: Left Arm, Patient Position: Sitting, Cuff Size: Normal)   Pulse (!) 58   Temp 97.7 F (36.5 C) (Temporal)   Wt 212 lb 6.4 oz (96.3 kg)   SpO2 98%   BMI 28.81 kg/m  Wt Readings from Last 3 Encounters:  11/30/19 212 lb 6.4 oz (96.3 kg)  09/12/19 208 lb (94.3 kg)  07/26/19 208 lb (94.3 kg)    Diabetic Foot Exam - Simple   No data filed     Lab Results  Component Value Date   WBC 6.0 11/24/2019   HGB 13.9 11/24/2019   HCT 42.2 11/24/2019   PLT 226 11/24/2019   GLUCOSE 98 11/24/2019   CHOL 112 11/24/2019   TRIG 119 11/24/2019   HDL 40 11/24/2019   LDLDIRECT  149.4 09/13/2007   LDLCALC 51 11/24/2019   ALT 23 11/24/2019   AST 19 11/24/2019   NA 137 11/24/2019   K 4.6 11/24/2019   CL 102 11/24/2019   CREATININE 1.01 11/24/2019   BUN 20 11/24/2019   CO2 28 11/24/2019   TSH 2.83 11/24/2019   PSA 0.86 05/30/2019   HGBA1C 6.0 (H) 11/24/2019    Lab Results  Component Value Date   TSH 2.83 11/24/2019   Lab Results  Component Value Date   WBC 6.0 11/24/2019   HGB 13.9 11/24/2019   HCT 42.2 11/24/2019   MCV 91.5 11/24/2019   PLT 226 11/24/2019   Lab Results  Component Value Date   NA 137 11/24/2019   K 4.6 11/24/2019   CO2 28 11/24/2019   GLUCOSE 98 11/24/2019   BUN 20 11/24/2019   CREATININE 1.01 11/24/2019   BILITOT 0.9 11/24/2019   ALKPHOS 102  05/30/2019   AST 19 11/24/2019   ALT 23 11/24/2019   PROT 7.2 11/24/2019   ALBUMIN 4.4 05/30/2019   CALCIUM 9.5 11/24/2019   ANIONGAP 8 03/11/2014   GFR 73.08 05/30/2019   Lab Results  Component Value Date   CHOL 112 11/24/2019   Lab Results  Component Value Date   HDL 40 11/24/2019   Lab Results  Component Value Date   LDLCALC 51 11/24/2019   Lab Results  Component Value Date   TRIG 119 11/24/2019   Lab Results  Component Value Date   CHOLHDL 2.8 11/24/2019   Lab Results  Component Value Date   HGBA1C 6.0 (H) 11/24/2019       Assessment & Plan:   Problem List Items Addressed This Visit    Hyperlipidemia, mixed    Tolerating statin, encouraged heart healthy diet, avoid trans fats, minimize simple carbs and saturated fats. Increase exercise      Relevant Orders   Lipid panel   Essential hypertension    Well controlled, no changes to meds. Encouraged heart healthy diet such as the DASH diet and exercise as tolerated.       Relevant Orders   CBC   Comprehensive metabolic panel   TSH   CAD, NATIVE VESSEL - Primary   Bilateral knee pain    Stiff in knees in am gets better if he is up awhile. Likely OA but if worsens will proceed with imaging and consideration of psoriasis making his pain worse      Preventative health care   Relevant Orders   PSA   Hyperglycemia    hgba1c acceptable, minimize simple carbs. Increase exercise as tolerated.       Relevant Orders   Hemoglobin A1c   Erectile dysfunction   Relevant Orders   PSA   Psoriasis    Follows with dermatology, is doing well with topical treaments       Other Visit Diagnoses    Influenza vaccine administered       Relevant Orders   Flu Vaccine QUAD 6+ mos PF IM (Fluarix Quad PF)      I am having Arjay A. Blyden maintain his mupirocin ointment, meloxicam, atorvastatin, ezetimibe, metoprolol succinate, losartan, rOPINIRole, methylPREDNISolone, cephALEXin, buPROPion, zolpidem, multivitamin  with minerals, cetirizine, Probiotic Product (PROBIOTIC PO), lansoprazole, Coenzyme Q10, ibuprofen, Melatonin, clobetasol ointment, and Enstilar.  No orders of the defined types were placed in this encounter.    Penni Homans, MD

## 2019-11-30 NOTE — Assessment & Plan Note (Signed)
Follows with dermatology, is doing well with topical treaments

## 2019-12-04 ENCOUNTER — Encounter (HOSPITAL_COMMUNITY)
Admission: RE | Disposition: A | Discharge status: Home / Self Care | Payer: Self-pay | Source: Home / Self Care | Attending: Gastroenterology

## 2019-12-04 ENCOUNTER — Other Ambulatory Visit: Payer: Self-pay

## 2019-12-04 ENCOUNTER — Ambulatory Visit (HOSPITAL_COMMUNITY): Payer: Managed Care, Other (non HMO) | Admitting: Anesthesiology

## 2019-12-04 ENCOUNTER — Encounter (HOSPITAL_COMMUNITY): Payer: Self-pay | Admitting: Gastroenterology

## 2019-12-04 ENCOUNTER — Ambulatory Visit (HOSPITAL_COMMUNITY)
Admission: RE | Admit: 2019-12-04 | Discharge: 2019-12-04 | Disposition: A | Discharge status: Home / Self Care | Payer: Managed Care, Other (non HMO) | Attending: Gastroenterology | Admitting: Gastroenterology

## 2019-12-04 DIAGNOSIS — D127 Benign neoplasm of rectosigmoid junction: Secondary | ICD-10-CM | POA: Insufficient documentation

## 2019-12-04 DIAGNOSIS — Z881 Allergy status to other antibiotic agents status: Secondary | ICD-10-CM | POA: Insufficient documentation

## 2019-12-04 DIAGNOSIS — Z880 Allergy status to penicillin: Secondary | ICD-10-CM | POA: Diagnosis not present

## 2019-12-04 DIAGNOSIS — D128 Benign neoplasm of rectum: Secondary | ICD-10-CM | POA: Diagnosis not present

## 2019-12-04 DIAGNOSIS — K635 Polyp of colon: Secondary | ICD-10-CM | POA: Insufficient documentation

## 2019-12-04 DIAGNOSIS — K5730 Diverticulosis of large intestine without perforation or abscess without bleeding: Secondary | ICD-10-CM | POA: Diagnosis not present

## 2019-12-04 DIAGNOSIS — K641 Second degree hemorrhoids: Secondary | ICD-10-CM | POA: Diagnosis not present

## 2019-12-04 DIAGNOSIS — K56609 Unspecified intestinal obstruction, unspecified as to partial versus complete obstruction: Secondary | ICD-10-CM | POA: Insufficient documentation

## 2019-12-04 DIAGNOSIS — K621 Rectal polyp: Secondary | ICD-10-CM

## 2019-12-04 DIAGNOSIS — D509 Iron deficiency anemia, unspecified: Secondary | ICD-10-CM

## 2019-12-04 DIAGNOSIS — D123 Benign neoplasm of transverse colon: Secondary | ICD-10-CM | POA: Diagnosis not present

## 2019-12-04 DIAGNOSIS — R195 Other fecal abnormalities: Secondary | ICD-10-CM | POA: Diagnosis present

## 2019-12-04 HISTORY — PX: POLYPECTOMY: SHX5525

## 2019-12-04 HISTORY — PX: COLONOSCOPY WITH PROPOFOL: SHX5780

## 2019-12-04 SURGERY — COLONOSCOPY WITH PROPOFOL
Anesthesia: Monitor Anesthesia Care

## 2019-12-04 MED ORDER — PROPOFOL 10 MG/ML IV BOLUS
INTRAVENOUS | Status: DC | PRN
  Administered 2019-12-04 (×4): 20 mg via INTRAVENOUS

## 2019-12-04 MED ORDER — PROPOFOL 500 MG/50ML IV EMUL
INTRAVENOUS | Status: DC | PRN
  Administered 2019-12-04: 100 ug/kg/min via INTRAVENOUS

## 2019-12-04 MED ORDER — PHENYLEPHRINE 40 MCG/ML (10ML) SYRINGE FOR IV PUSH (FOR BLOOD PRESSURE SUPPORT)
PREFILLED_SYRINGE | INTRAVENOUS | Status: DC | PRN
  Administered 2019-12-04: 80 ug via INTRAVENOUS

## 2019-12-04 MED ORDER — SODIUM CHLORIDE 0.9 % IV SOLN: INTRAVENOUS | Status: DC

## 2019-12-04 MED ORDER — LACTATED RINGERS IV SOLN
INTRAVENOUS | Status: AC | PRN
  Administered 2019-12-04: 1000 mL via INTRAVENOUS

## 2019-12-04 SURGICAL SUPPLY — 22 items

## 2019-12-04 NOTE — Anesthesia Postprocedure Evaluation (Signed)
Anesthesia Post Note  Patient: Dakota [name]  Procedure(s) Performed: COLONOSCOPY WITH PROPOFOL (N/A ) POLYPECTOMY     Patient location during evaluation: PACU Anesthesia Type: MAC Level of consciousness: awake and alert Pain management: pain level controlled Vital Signs Assessment: post-procedure vital signs reviewed and stable Respiratory status: spontaneous breathing and respiratory function stable Cardiovascular status: stable Postop Assessment: no apparent nausea or vomiting Anesthetic complications: no   No complications documented.  Last Vitals:  Vitals:   12/04/19 1102 12/04/19 1335  BP: (!) 157/98 122/83  Pulse: (!) 58 63  Resp: 14 13  Temp: 36.7 C (!) 36.4 C  SpO2: 98% 100%    Last Pain:  Vitals:   12/04/19 1335  TempSrc: Temporal  PainSc: 0-No pain                 Merlinda Frederick

## 2019-12-04 NOTE — Transfer of Care (Signed)
Immediate Anesthesia Transfer of Care Note  Patient: Dakota [name]  Procedure(s) Performed: COLONOSCOPY WITH PROPOFOL (N/A ) POLYPECTOMY  Patient Location: Endoscopy Unit  Anesthesia Type:MAC  Level of Consciousness: awake  Airway & Oxygen Therapy: Patient Spontanous Breathing and Patient connected to nasal cannula oxygen  Post-op Assessment: Report given to RN and Post -op Vital signs reviewed and stable  Post vital signs: Reviewed and stable  Last Vitals:  Vitals Value Taken Time  BP    Temp    Pulse    Resp    SpO2      Last Pain:  Vitals:   12/04/19 1102  TempSrc: Oral  PainSc: 0-No pain         Complications: No complications documented.

## 2019-12-04 NOTE — Discharge Instructions (Signed)
Colonoscopy, Adult, Care After This sheet gives you information about how to care for yourself after your procedure. Your doctor may also give you more specific instructions. If you have problems or questions, call your doctor. What can I expect after the procedure? After the procedure, it is common to have:  A small amount of blood in your poop (stool) for 24 hours.  Some gas.  Mild cramping or bloating in your belly (abdomen). Follow these instructions at home: Eating and drinking   Drink enough fluid to keep your pee (urine) pale yellow.  Follow instructions from your doctor about what you cannot eat or drink.  Return to your normal diet as told by your doctor. Avoid heavy or fried foods that are hard to digest. Activity  Rest as told by your doctor.  Do not sit for a long time without moving. Get up to take short walks every 1-2 hours. This is important. Ask for help if you feel weak or unsteady.  Return to your normal activities as told by your doctor. Ask your doctor what activities are safe for you. To help cramping and bloating:   Try walking around.  Put heat on your belly as told by your doctor. Use the heat source that your doctor recommends, such as a moist heat pack or a heating pad. ? Put a towel between your skin and the heat source. ? Leave the heat on for 20-30 minutes. ? Remove the heat if your skin turns bright red. This is very important if you are unable to feel pain, heat, or cold. You may have a greater risk of getting burned. General instructions  For the first 24 hours after the procedure: ? Do not drive or use machinery. ? Do not sign important documents. ? Do not drink alcohol. ? Do your daily activities more slowly than normal. ? Eat foods that are soft and easy to digest.  Take over-the-counter or prescription medicines only as told by your doctor.  Keep all follow-up visits as told by your doctor. This is important. Contact a doctor  if:  You have blood in your poop 2-3 days after the procedure. Get help right away if:  You have more than a small amount of blood in your poop.  You see large clumps of tissue (blood clots) in your poop.  Your belly is swollen.  You feel like you may vomit (nauseous).  You vomit.  You have a fever.  You have belly pain that gets worse, and medicine does not help your pain. Summary  After the procedure, it is common to have a small amount of blood in your poop. You may also have mild cramping and bloating in your belly.  For the first 24 hours after the procedure, do not drive or use machinery, do not sign important documents, and do not drink alcohol.  Get help right away if you have a lot of blood in your poop, feel like you may vomit, have a fever, or have more belly pain. This information is not intended to replace advice given to you by your health care provider. Make sure you discuss any questions you have with your health care provider. Document Revised: 08/15/2018 Document Reviewed: 08/15/2018 Elsevier Patient Education  2020 [employer]  

## 2019-12-04 NOTE — Op Note (Signed)
[facility] Patient Name: Dakota [name] Procedure Date : 12/04/2019 MRN: [id number] Attending MD: Justice Britain , MD Date of Birth: [birth year] CSN: [id number] Age: 59 Admit Type: Outpatient Procedure:                Colonoscopy Indications:              Heme positive stool, Previous incomplete colonoscopy Providers:                Justice Britain, MD, Baird Cancer, RN, Cletis Athens, Technician Referring MD:             Estill Cotta. Loletha Carrow, MD, Bonnita Levan Charlett Blake Medicines:                Monitored Anesthesia Care Complications:            No immediate complications. Estimated Blood Loss:     Estimated blood loss was minimal. Procedure:                Pre-Anesthesia Assessment:                           - Prior to the procedure, a History and Physical                            was performed, and patient medications and                            allergies were reviewed. The patient's tolerance of                            previous anesthesia was also reviewed. The risks                            and benefits of the procedure and the sedation                            options and risks were discussed with the patient.                            All questions were answered, and informed consent                            was obtained. Prior Anticoagulants: The patient has                            taken no previous anticoagulant or antiplatelet                            agents. ASA Grade Assessment: III - A patient with                            severe systemic disease. After reviewing the risks  and benefits, the patient was deemed in                            satisfactory condition to undergo the procedure.                           After obtaining informed consent, the colonoscope                            was passed under direct vision. Throughout the                            procedure, the patient's blood  pressure, pulse, and                            oxygen saturations were monitored continuously. The                            PCF-PH190L ([id number] Olympus ultra slim colonoscope                            was introduced through the anus and advanced to the                            the cecum, identified by appendiceal orifice and                            ileocecal valve. The colonoscopy was unusually                            difficult due to restricted mobility of the colon.                            Successful completion of the procedure was aided by                            changing the patient's position, changing the                            patient to a supine then severe left lateral                            decubitus then finally into a right decubitus                            position. We used manual pressure, withdrawing and                            reinserting the scope, straightening and shortening                            the scope to obtain bowel loop reduction and using  scope torsion. The patient tolerated the procedure.                            The quality of the bowel preparation was adequate.                            The ileocecal valve, appendiceal orifice, and                            rectum were photographed. Scope In: 12:48:39 PM Scope Out: 1:27:14 PM Scope Withdrawal Time: 0 hours 17 minutes 2 seconds  Total Procedure Duration: 0 hours 38 minutes 35 seconds  Findings:      The digital rectal exam findings include hemorrhoids. Pertinent       negatives include no palpable rectal lesions.      A benign-appearing, intrinsic severe narrowing measuring at least 4 cm       (in length) was found in the recto-sigmoid colon and was traversed. The       colon was restricted in mobility and significantly tortuous. As noted       above, required significant patient positioning ot traverse this region.      Five sessile polyps  were found in the rectum (2), recto-sigmoid colon       (1) and transverse colon (2). The polyps were 2 to 7 mm in size. These       polyps were removed with a cold snare. Resection and retrieval were       complete.      Multiple small-mouthed diverticula were found in the recto-sigmoid colon       and sigmoid colon.      Normal mucosa was found in the entire colon otherwise.      Non-bleeding non-thrombosed external and internal hemorrhoids were found       during retroflexion, during perianal exam and during digital exam. The       hemorrhoids were Grade II (internal hemorrhoids that prolapse but reduce       spontaneously). Impression:               - Hemorrhoids found on digital rectal exam.                           - Narrowing in the recto-sigmoid colon in region of                            diverticulosis with significant decreased                            mobility/restriction. Traversed only after placing                            patient in Right Decubitus position and significant                            water immersion.                           - Five 2 to 7 mm polyps in the rectum, at the  recto-sigmoid colon and in the transverse colon,                            removed with a cold snare. Resected and retrieved.                           - Diverticulosis in the recto-sigmoid colon and in                            the sigmoid colon.                           - Normal mucosa in the entire examined colon                            otherwise.                           - Non-bleeding non-thrombosed external and internal                            hemorrhoids. Recommendation:           - The patient will be observed post-procedure,                            until all discharge criteria are met.                           - Discharge patient to home.                           - Patient has a contact number available for                             emergencies. The signs and symptoms of potential                            delayed complications were discussed with the                            patient. Return to normal activities tomorrow.                            Written discharge instructions were provided to the                            patient.                           - High fiber diet.                           - Use FiberCon 1-2 tablets PO daily.                           - Continue present medications.                           -  Await pathology results.                           - Repeat colonoscopy in 3/5/7 years for                            surveillance based on pathology results and                            findings of adenomatous v serrated tissue.                            Recommend Ultra-slim colonoscope and right                            decubitus positioning to begin procedure and                            complete water immersion.                           - Further workup/evaluation for occult stool as per                            primary GI.                           - The findings and recommendations were discussed                            with the patient.                           - The findings and recommendations were discussed                            with the patient's family. Procedure Code(s):        --- Professional ---                           [phone], Colonoscopy, flexible; with removal of                            tumor(s), polyp(s), or other lesion(s) by snare                            technique Diagnosis Code(s):        --- Professional ---                           K64.1, Second degree hemorrhoids                           K56.699, Other intestinal obstruction unspecified                            as to partial versus complete obstruction  K62.1, Rectal polyp                           K63.5, Polyp of colon                           R19.5, Other  fecal abnormalities                           K57.30, Diverticulosis of large intestine without                            perforation or abscess without bleeding CPT copyright 2019 American Medical Association. All rights reserved. The codes documented in this report are preliminary and upon coder review may  be revised to meet current compliance requirements. Justice Britain, MD 12/04/2019 1:57:20 PM Number of Addenda: 0

## 2019-12-04 NOTE — H&P (Signed)
GASTROENTEROLOGY PROCEDURE H&P NOTE   Primary Care Physician: [name], MD  HPI: Dakota [name] is a 59 y.o. male who presents for Colonoscopy attempt after failed colonoscopy.  Past Medical History:  Diagnosis Date  . Allergy    cats  . Anxiety   . Asthma    childhood  . CAD (coronary artery disease)   . Chicken pox as a child  . Depression 12/25/2012  . Dysphagia 12/16/2015  . H/O tobacco use, presenting hazards to health 02/11/2011   1/2 ppd encouraged Has tried nicotine replacements and Chantix in past   . Hyperlipidemia   . Hypertension   . Insomnia 06/11/2013  . 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 Kidney stone on left side 09/19/2013  . Left knee pain 12/25/2012  . Mumps as a child  . Onychomycosis 09/30/2014  . Overweight 12/16/2015  . Preventative health care 09/24/2013   Past Surgical History:  Procedure Laterality Date  . COLONOSCOPY    . CORONARY STENT PLACEMENT  01-14-05  . LITHOTRIPSY  2/16   Current Facility-Administered Medications  Medication Dose Route Frequency Provider Last Rate Last Admin  . 0.9 %  sodium chloride infusion   Intravenous Continuous [name], Telford Nab., MD       Allergies  Allergen Reactions  . Neosporin [Neomycin-Bacitracin Zn-Polymyx] Hives and Itching  . Polysporin [Bacitracin-Polymyxin B] Hives and Itching  . Amoxicillin Itching and Rash   Family History  Problem Relation Age of Onset  . Multiple sclerosis Mother   . Cancer Father        esophagus/ brain  . Alcohol abuse Father        smoker  . Esophageal cancer Father   . Seizures Sister        AVM  . COPD Sister   . Alcohol abuse Sister        addiction, recovered  . Heart disease Maternal Grandfather        MI  . Cancer Paternal Aunt        leukemia  . Colon cancer Neg Hx   . Colon polyps Neg Hx   . Rectal cancer Neg Hx   . Stomach cancer Neg Hx    Social History   Socioeconomic History  . Marital status: Married    Spouse name: Not  on file  . Number of children: 2  . Years of education: Not on file  . Highest education level: Not on file  Occupational History  . Occupation: Clinical cytogeneticist  Tobacco Use  . Smoking status: Former Smoker    Packs/day: 0.50    Years: 20.00    Pack years: 10.00    Types: Cigarettes    Quit date: 02/20/2015    Years since quitting: 4.7  . Smokeless tobacco: Never Used  Vaping Use  . Vaping Use: Every day  Substance and Sexual Activity  . Alcohol use: Yes    Comment: 10 drinks per week  . Drug use: No  . Sexual activity: Yes    Partners: Female    Comment: wife, real Investment banker, corporate, no dairy, minimal fried foods  Other Topics Concern  . Not on file  Social History Narrative   1 biological son   1 adopted child   3 step children   Social Determinants of Health   Financial Resource Strain:   . Difficulty of Paying Living Expenses: Not on file  Food Insecurity:   . Worried About Charity fundraiser in  the Last Year: Not on file  . Ran Out of Food in the Last Year: Not on file  Transportation Needs:   . Lack of Transportation (Medical): Not on file  . Lack of Transportation (Non-Medical): Not on file  Physical Activity:   . Days of Exercise per Week: Not on file  . Minutes of Exercise per Session: Not on file  Stress:   . Feeling of Stress : Not on file  Social Connections:   . Frequency of Communication with Friends and Family: Not on file  . Frequency of Social Gatherings with Friends and Family: Not on file  . Attends Religious Services: Not on file  . Active Member of Clubs or Organizations: Not on file  . Attends Archivist Meetings: Not on file  . Marital Status: Not on file  Intimate Partner Violence:   . Fear of Current or Ex-Partner: Not on file  . Emotionally Abused: Not on file  . Physically Abused: Not on file  . Sexually Abused: Not on file    Physical Exam: Vital signs in last 24 hours:     GEN: NAD EYE: Sclerae  anicteric ENT: MMM CV: Non-tachycardic GI: Soft, NT/ND NEURO:  Alert & Oriented x 3  Lab Results: No results for input(s): WBC, HGB, HCT, PLT in the last 72 hours. BMET No results for input(s): NA, K, CL, CO2, GLUCOSE, BUN, CREATININE, CALCIUM in the last 72 hours. LFT No results for input(s): PROT, ALBUMIN, AST, ALT, ALKPHOS, BILITOT, BILIDIR, IBILI in the last 72 hours. PT/INR No results for input(s): LABPROT, INR in the last 72 hours.   Impression / Plan: This is a 59 y.o.male who presents for Colonoscopy attempt after failed colonoscopy.  The risks and benefits of endoscopic evaluation were discussed with the patient; these include but are not limited to the risk of perforation, infection, bleeding, missed lesions, lack of diagnosis, severe illness requiring hospitalization, as well as anesthesia and sedation related illnesses.  The patient is agreeable to proceed.    Justice Britain, MD [facility] Advanced [facility]oscopy Office # [phone]

## 2019-12-04 NOTE — Anesthesia Preprocedure Evaluation (Addendum)
Anesthesia Evaluation  Patient identified by MRN, date of birth, ID band Patient awake    Reviewed: Allergy & Precautions, NPO status , Patient's Chart, lab work & pertinent test results  Airway Mallampati: III  TM Distance: >3 FB Neck ROM: Full    Dental no notable dental hx.    Pulmonary asthma , former smoker,    Pulmonary exam normal breath sounds clear to auscultation       Cardiovascular hypertension, + CAD  Normal cardiovascular exam Rhythm:Regular Rate:Normal   EKG: NSR   Neuro/Psych PSYCHIATRIC DISORDERS Anxiety Depression    GI/Hepatic negative GI ROS, Neg liver ROS,   Endo/Other  negative endocrine ROS  Renal/GU Renal disease (kidney stones)  negative genitourinary   Musculoskeletal negative musculoskeletal ROS (+)   Abdominal   Peds  Hematology negative hematology ROS (+)   Anesthesia Other Findings   Reproductive/Obstetrics negative OB ROS                            Anesthesia Physical Anesthesia Plan  ASA: II  Anesthesia Plan: MAC   Post-op Pain Management:    Induction:   PONV Risk Score and Plan: 1 and TIVA and Propofol infusion  Airway Management Planned: Natural Airway and Simple Face Mask  Additional Equipment:   Intra-op Plan:   Post-operative Plan:   Informed Consent: I have reviewed the patients History and Physical, chart, labs and discussed the procedure including the risks, benefits and alternatives for the proposed anesthesia with the patient or authorized representative who has indicated his/her understanding and acceptance.       Plan Discussed with: CRNA and Anesthesiologist  Anesthesia Plan Comments:         Anesthesia Quick Evaluation

## 2019-12-05 LAB — SURGICAL PATHOLOGY

## 2019-12-06 ENCOUNTER — Encounter (HOSPITAL_COMMUNITY): Payer: Self-pay | Admitting: Gastroenterology

## 2019-12-06 ENCOUNTER — Encounter: Payer: Self-pay | Admitting: Gastroenterology

## 2019-12-08 ENCOUNTER — Telehealth: Payer: Self-pay

## 2019-12-08 NOTE — Telephone Encounter (Signed)
Recall entered as requested

## 2019-12-08 NOTE — Telephone Encounter (Signed)
-----   Message from Irving Copas., MD sent at 12/08/2019  1:28 PM EDT ----- Regarding: Follow-up colonoscopy [facility] [place],Dr. Loletha Carrow and I have discussed the results and colonoscopy findings.Please set the patient up for follow-up colonoscopy with me in 3 years with ultraslim colonoscope.He will continue any further work-up with Dr. Loletha Carrow as his primary GI. Thanks.GM

## 2019-12-11 ENCOUNTER — Other Ambulatory Visit: Payer: Self-pay | Admitting: Family Medicine

## 2019-12-29 ENCOUNTER — Other Ambulatory Visit: Payer: Self-pay | Admitting: Family Medicine

## 2019-12-30 NOTE — Telephone Encounter (Signed)
Requesting: ambien Contract:n/a UDS:n/a Last Visit:11/30/19 Next Visit:06/2020 Last Refill:10/2019  Please Advise

## 2020-01-02 ENCOUNTER — Other Ambulatory Visit: Payer: Self-pay | Admitting: Family Medicine

## 2020-01-02 ENCOUNTER — Encounter: Payer: Self-pay | Admitting: Family Medicine

## 2020-01-02 MED ORDER — METHYLPREDNISOLONE 4 MG PO TABS: ORAL_TABLET | ORAL | 0 refills | Status: DC

## 2020-01-02 NOTE — Telephone Encounter (Signed)
Looks like patient does not want to see another provider.  Can you review his message?

## 2020-02-29 ENCOUNTER — Encounter: Payer: Self-pay | Admitting: Family Medicine

## 2020-02-29 MED ORDER — ROPINIROLE HCL 0.5 MG PO TABS
0.5000 mg | ORAL_TABLET | Freq: Every day | ORAL | 2 refills | Status: DC
Start: 2020-02-29 — End: 2021-10-22

## 2020-03-05 ENCOUNTER — Other Ambulatory Visit: Payer: Self-pay | Admitting: Family Medicine

## 2020-03-05 ENCOUNTER — Encounter: Payer: Self-pay | Admitting: Family Medicine

## 2020-03-05 DIAGNOSIS — M542 Cervicalgia: Secondary | ICD-10-CM

## 2020-03-05 MED ORDER — TIZANIDINE HCL 2 MG PO TABS: 1.0000 mg | ORAL_TABLET | Freq: Three times a day (TID) | ORAL | 1 refills | Status: DC | PRN

## 2020-03-05 MED ORDER — METHYLPREDNISOLONE 4 MG PO TABS
ORAL_TABLET | ORAL | 0 refills | Status: DC
Start: 2020-03-05 — End: 2020-06-03

## 2020-03-08 ENCOUNTER — Ambulatory Visit (HOSPITAL_BASED_OUTPATIENT_CLINIC_OR_DEPARTMENT_OTHER)
Admission: RE | Admit: 2020-03-08 | Discharge: 2020-03-08 | Disposition: A | Discharge status: Home / Self Care | Payer: Managed Care, Other (non HMO) | Source: Ambulatory Visit | Attending: Family Medicine | Admitting: Family Medicine

## 2020-03-08 ENCOUNTER — Other Ambulatory Visit: Payer: Self-pay

## 2020-03-08 DIAGNOSIS — M542 Cervicalgia: Secondary | ICD-10-CM | POA: Diagnosis not present

## 2020-03-08 IMAGING — DX DG CERVICAL SPINE COMPLETE 4+V
6 series · 6 of 6 positions shown · non-contrast
Comparison: None.

CLINICAL DATA: Neck pain radiating into both shoulders, history of
disc disease

EXAM:
CERVICAL SPINE - COMPLETE 4+ VIEW

[c-spine lat]
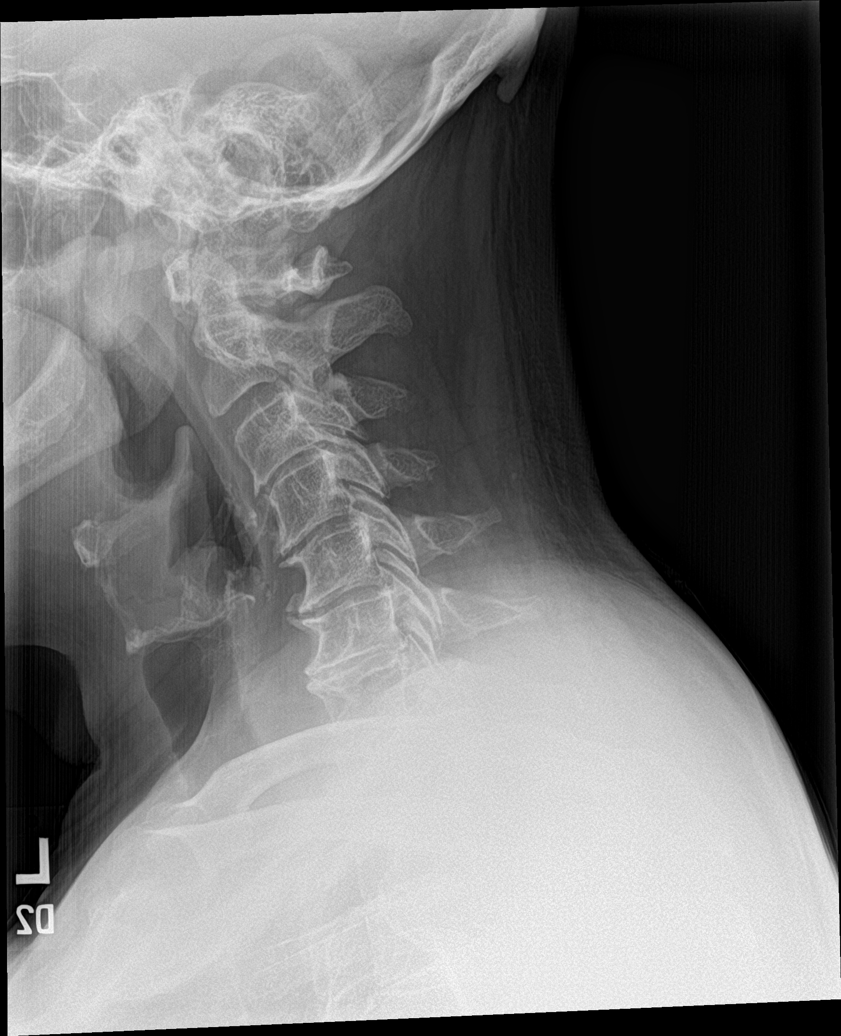

[c-spine obl (1 of 2)]
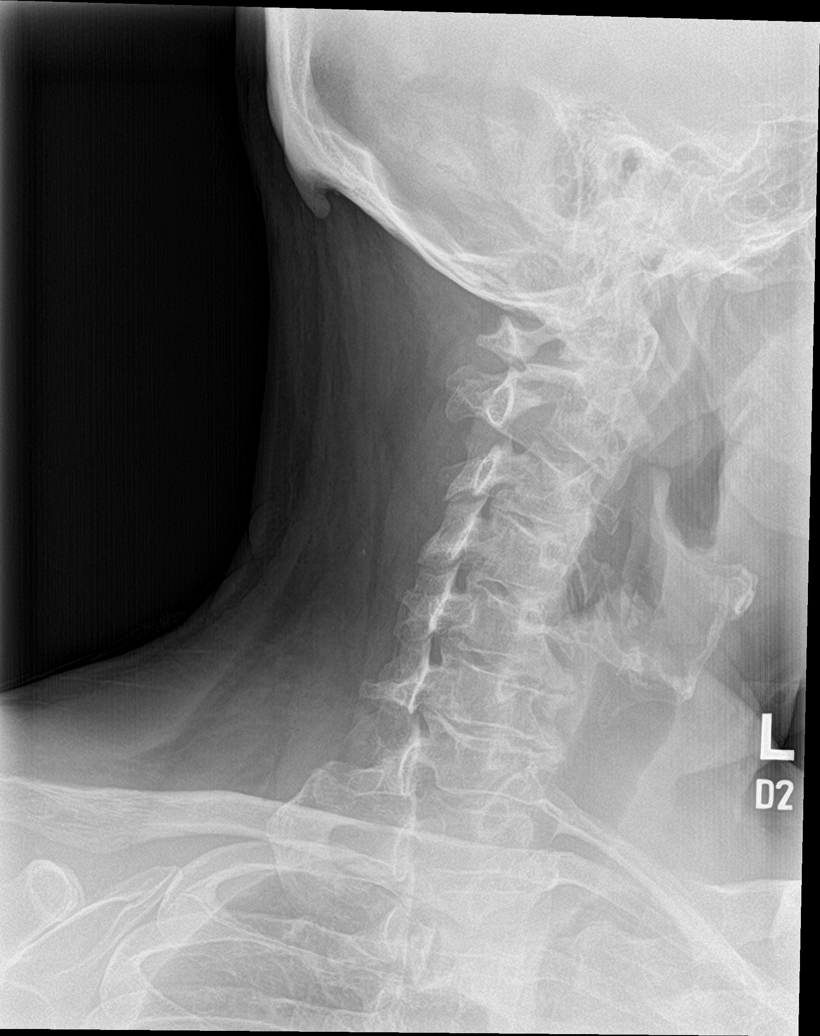

[c-spine obl (2 of 2)]
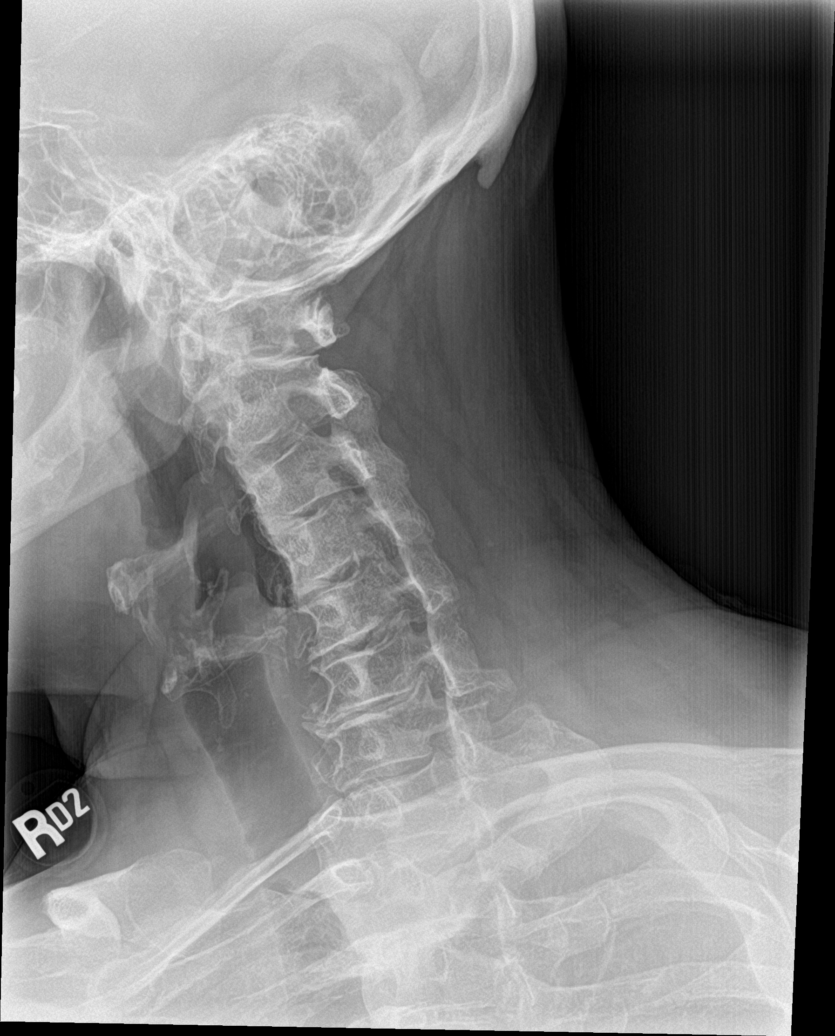

[c-spine ap]
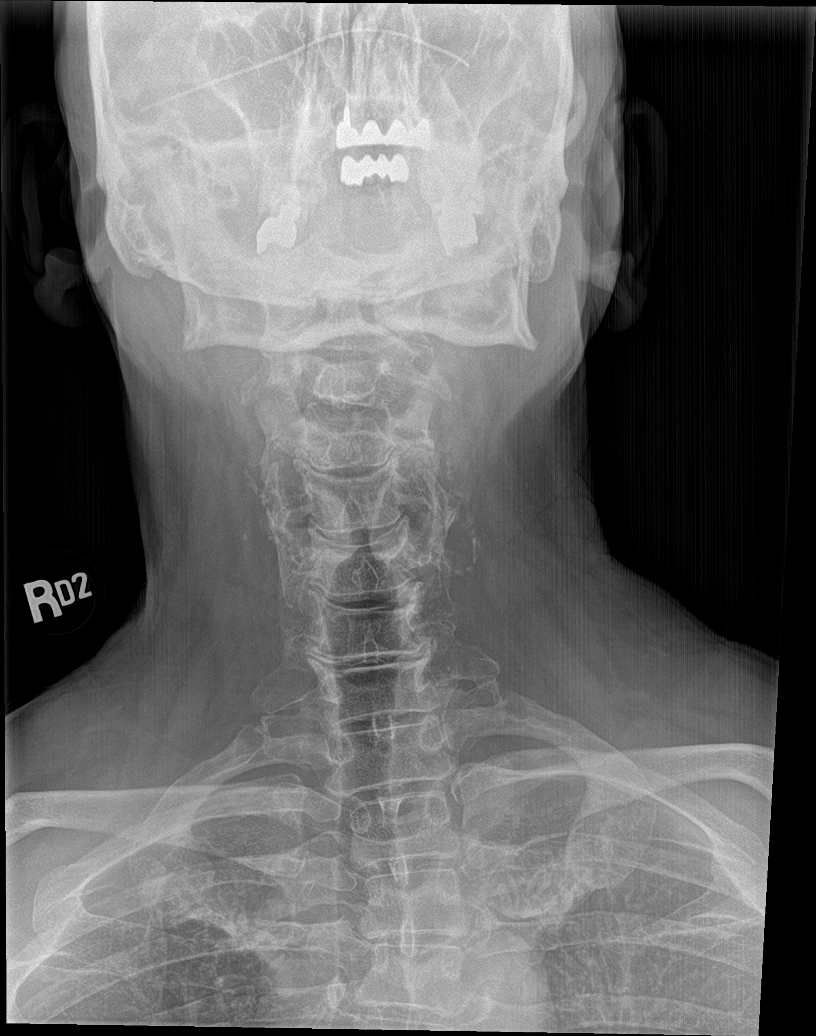

[c-spine open mouth]
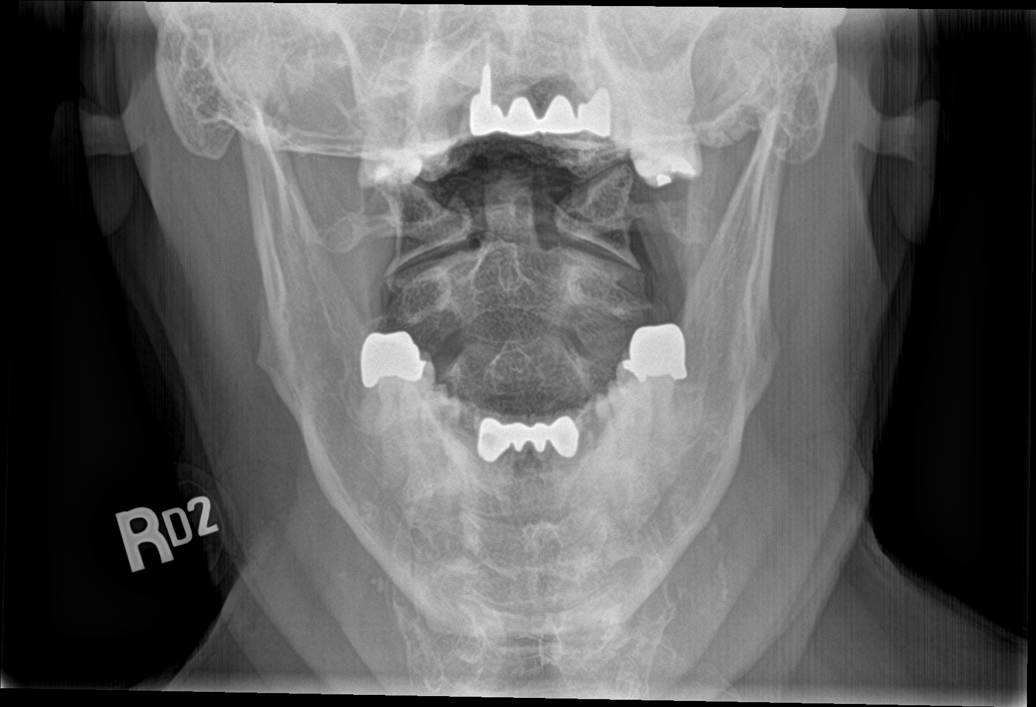

[c-spine swimmers]
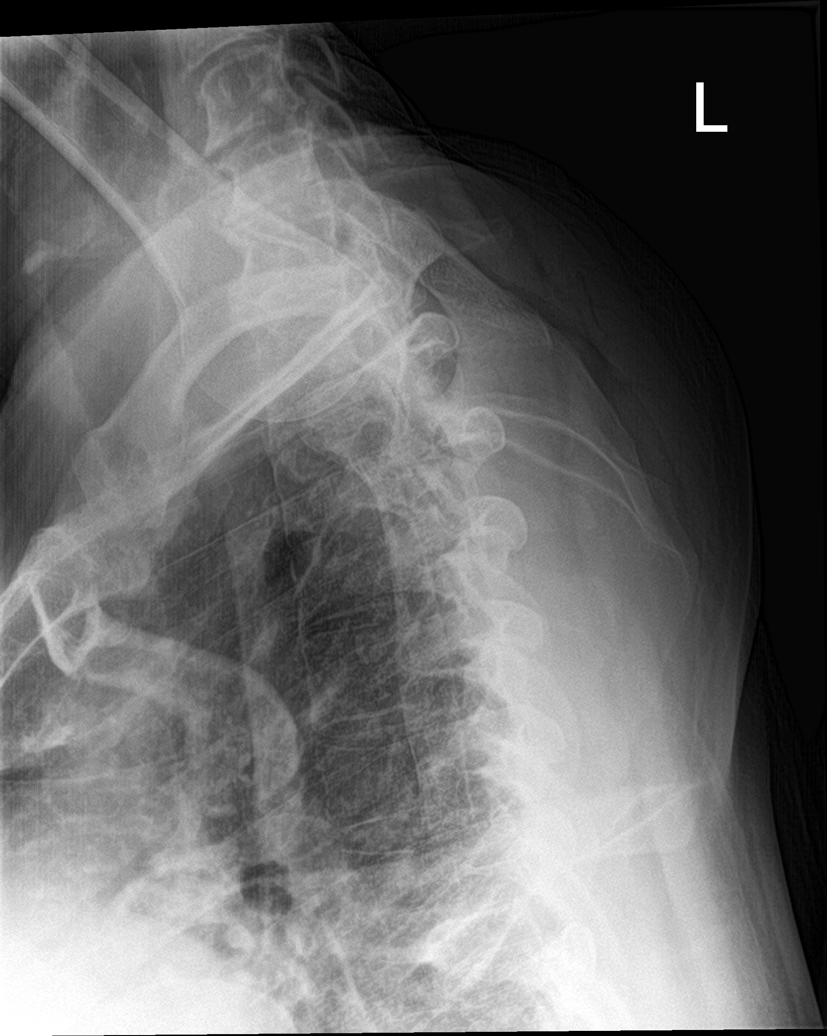

[6 of 6 positions shown; findings below may reference images not displayed]

FINDINGS: Frontal, bilateral oblique, and lateral views of the cervical spine
are obtained. There is reversal of cervical lordosis, with mild
kyphosis centered at the C4 level. Otherwise alignment is anatomic.
There is moderate spondylosis from C3 through C7, with significant
disc space narrowing and anterior osteophyte formation. There is
mild symmetrical neural foraminal narrowing from C3-4 through C6-7.
Prevertebral soft tissues are unremarkable. Lung apices are clear.
IMPRESSION: 1. Multilevel spondylosis from C3 through C7, with symmetrical
neural foraminal encroachment.

## 2020-03-27 ENCOUNTER — Other Ambulatory Visit: Payer: Self-pay | Admitting: Cardiology

## 2020-04-04 NOTE — Progress Notes (Signed)
HPI: FU coronary artery disease. 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 Cath revealed normal LM. Left anterior descending artery had 30% lesion in the proximal and mid-vessel. Lcx with 60 % lesion. There were left-to-right collaterals to the RCA. Right coronary artery was dominant and was 100% midvessel occlusion after the takeoff of a medium-sized RV branch; inferobasal wall akinesis. EF was 50%. He underwent PCI of the right coronary artery with bare-metal stent. EF was 50%. Stress echocardiogram in February 2013 was normal.Carotid Dopplers June 2018 showed 1 to 39% bilateral stenosis.Since last seen, the patient denies any dyspnea on exertion, orthopnea, PND, pedal edema, palpitations, syncope or chest pain.   Current Outpatient Medications  Medication Sig Dispense Refill  . atorvastatin (LIPITOR) 80 MG tablet TAKE 1 TABLET DAILY AT 6 P.M. 60 tablet 1  . buPROPion (WELLBUTRIN XL) 300 MG 24 hr tablet TAKE 1 TABLET BY MOUTH DAILY (Patient taking differently: Take 300 mg by mouth daily.) 90 tablet 3  . Calcipotriene-Betameth Diprop (ENSTILAR) 0.005-0.064 % FOAM Apply 1 application topically daily as needed (psoriasis).    . cetirizine (ZYRTEC) 10 MG tablet Take 10 mg by mouth daily as needed for allergies.    . clobetasol ointment (TEMOVATE) 6.73 % Apply 1 application topically 2 (two) times daily as needed (psoriasis).    . Coenzyme Q10 300 MG CAPS Take 300 mg by mouth every other day.    . ibuprofen (ADVIL) 200 MG tablet Take 400-600 mg by mouth every 8 (eight) hours as needed for headache or moderate pain.    Marland Kitchen lansoprazole (PREVACID) 15 MG capsule Take 15 mg by mouth daily as needed (acid reflux).    Marland Kitchen losartan (COZAAR) 50 MG tablet Take 1 tablet (50 mg total) by mouth daily. 90 tablet 3  . Melatonin 10 MG CAPS Take 10 mg by mouth at bedtime as needed (sleep).    . meloxicam (MOBIC) 15 MG tablet TAKE 1 TABLET BY MOUTH DAILY 90 tablet 3  . methylPREDNISolone  (MEDROL) 4 MG tablet 5 tab po qd X 1d then 4 tab po qd X 1d then 3 tab po qd X 1d then 2 tab po qd then 1 tab po qd 15 tablet 0  . metoprolol succinate (TOPROL-XL) 50 MG 24 hr tablet TAKE 1 TABLET DAILY TAKE WITH OR IMMEDIATELY FOLLOWING A MEAL 60 tablet 1  . Multiple Vitamin (MULTIVITAMIN WITH MINERALS) TABS tablet Take 1 tablet by mouth daily.    . mupirocin ointment (BACTROBAN) 2 % Place 1 application into the nose 2 (two) times daily. (Patient taking differently: Place 1 application into the nose 2 (two) times daily as needed (wound care).) 30 g 1  . Probiotic Product (PROBIOTIC PO) Take 1 capsule by mouth every other day.    Marland Kitchen rOPINIRole (REQUIP) 0.5 MG tablet Take 1-2 tablets (0.5-1 mg total) by mouth at bedtime. 60 tablet 2  . tiZANidine (ZANAFLEX) 2 MG tablet Take 0.5-2 tablets (1-4 mg total) by mouth every 8 (eight) hours as needed for muscle spasms. 40 tablet 1  . zolpidem (AMBIEN) 10 MG tablet Take 0.5-1 tablets (5-10 mg total) by mouth at bedtime as needed for sleep. 30 tablet 3  . ezetimibe (ZETIA) 10 MG tablet Take 1 tablet (10 mg total) by mouth daily. (Patient taking differently: Take 10 mg by mouth every other day. ) 90 tablet 3   No current facility-administered medications for this visit.     Past Medical History:  Diagnosis Date  .  Allergy    cats  . Anxiety   . Asthma    childhood  . CAD (coronary artery disease)   . Chicken pox as a child  . Depression 12/25/2012  . Dysphagia 12/16/2015  . H/O tobacco use, presenting hazards to health 02/11/2011   1/2 ppd encouraged Has tried nicotine replacements and Chantix in past   . Hyperlipidemia   . Hypertension   . Insomnia 06/11/2013  . 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 Kidney stone on left side 09/19/2013  . Left knee pain 12/25/2012  . Mumps as a child  . Onychomycosis 09/30/2014  . Overweight 12/16/2015  . Preventative health care 09/24/2013    Past Surgical History:  Procedure Laterality Date  .  COLONOSCOPY    . COLONOSCOPY WITH PROPOFOL N/A 12/04/2019   Procedure: COLONOSCOPY WITH PROPOFOL;  Surgeon: Rush Landmark Telford Nab., MD;  Location: [facility];  Service: Gastroenterology;  Laterality: N/A;  ultra slim scope   . CORONARY STENT PLACEMENT  01-14-05  . LITHOTRIPSY  2/16  . POLYPECTOMY  12/04/2019   Procedure: POLYPECTOMY;  Surgeon: [name], Telford Nab., MD;  Location: [facility] ENDOSCOPY;  Service: Gastroenterology;;    Social History   Socioeconomic History  . Marital status: Married    Spouse name: Not on file  . Number of children: 2  . Years of education: Not on file  . Highest education level: Not on file  Occupational History  . Occupation: Clinical cytogeneticist  Tobacco Use  . Smoking status: Former Smoker    Packs/day: 0.50    Years: 20.00    Pack years: 10.00    Types: Cigarettes    Quit date: 02/20/2015    Years since quitting: 5.1  . Smokeless tobacco: Never Used  Vaping Use  . Vaping Use: Every day  Substance and Sexual Activity  . Alcohol use: Yes    Comment: 10 drinks per week  . Drug use: No  . Sexual activity: Yes    Partners: Female    Comment: wife, real Investment banker, corporate, no dairy, minimal fried foods  Other Topics Concern  . Not on file  Social History Narrative   1 biological son   1 adopted child   3 step children   Social Determinants of Health   Financial Resource Strain: Not on file  Food Insecurity: Not on file  Transportation Needs: Not on file  Physical Activity: Not on file  Stress: Not on file  Social Connections: Not on file  Intimate Partner Violence: Not on file    Family History  Problem Relation Age of Onset  . Multiple sclerosis Mother   . Cancer Father        esophagus/ brain  . Alcohol abuse Father        smoker  . Esophageal cancer Father   . Seizures Sister        AVM  . COPD Sister   . Alcohol abuse Sister        addiction, recovered  . Heart disease Maternal Grandfather        MI  . Cancer Paternal  Aunt        leukemia  . Colon cancer Neg Hx   . Colon polyps Neg Hx   . Rectal cancer Neg Hx   . Stomach cancer Neg Hx     ROS: no fevers or chills, productive cough, hemoptysis, dysphasia, odynophagia, melena, hematochezia, dysuria, hematuria, rash, seizure activity, orthopnea, PND, pedal edema, claudication. Remaining systems are negative.  Physical Exam: Well-developed well-nourished in no acute distress.  Skin is warm and dry.  HEENT is normal.  Neck is supple.  Chest is clear to auscultation with normal expansion.  Cardiovascular exam is regular rate and rhythm.  Abdominal exam nontender or distended. No masses palpated. Extremities show no edema. neuro grossly intact  ECG-normal sinus rhythm at a rate of 68, inferior infarct.  Personally reviewed  A/P  1 coronary artery disease-patient denies chest pain. Plan to continue medical therapy.  Continue statin.  Resume aspirin 81 mg daily.  2 hypertension-blood pressure controlled. Continue present medications and follow.  3 hyperlipidemia-continue statin.  4 carotid artery disease-continue medical therapy. Most recent Doppler showed mild disease.  Kirk Ruths, MD

## 2020-04-17 ENCOUNTER — Ambulatory Visit: Payer: Managed Care, Other (non HMO) | Admitting: Cardiology

## 2020-04-17 ENCOUNTER — Other Ambulatory Visit: Payer: Self-pay

## 2020-04-17 ENCOUNTER — Encounter: Payer: Self-pay | Admitting: Cardiology

## 2020-04-17 VITALS — BP 124/88 | HR 68 | Ht 72.0 in | Wt 212.0 lb

## 2020-04-17 DIAGNOSIS — I679 Cerebrovascular disease, unspecified: Secondary | ICD-10-CM

## 2020-04-17 DIAGNOSIS — I2510 Atherosclerotic heart disease of native coronary artery without angina pectoris: Secondary | ICD-10-CM

## 2020-04-17 DIAGNOSIS — E7800 Pure hypercholesterolemia, unspecified: Secondary | ICD-10-CM | POA: Diagnosis not present

## 2020-04-17 DIAGNOSIS — I10 Essential (primary) hypertension: Secondary | ICD-10-CM | POA: Diagnosis not present

## 2020-04-17 MED ORDER — ASPIRIN EC 81 MG PO TBEC: 81.0000 mg | DELAYED_RELEASE_TABLET | Freq: Every day | ORAL | 3 refills | Status: AC

## 2020-04-17 NOTE — Patient Instructions (Signed)
Medication Instructions:   START ASPIRIN 81 MG ONCE DAILY  *If you need a refill on your cardiac medications before your next appointment, please call your pharmacy*   Follow-Up: At [facility], you and your health needs are our priority.  As part of our continuing mission to provide you with exceptional heart care, we have created designated Provider Care Teams.  These Care Teams include your primary Cardiologist (physician) and Advanced Practice Providers (APPs -  Physician Assistants and Nurse Practitioners) who all work together to provide you with the care you need, when you need it.  We recommend signing up for the patient portal called "MyChart".  Sign up information is provided on this After Visit Summary.  MyChart is used to connect with patients for Virtual Visits (Telemedicine).  Patients are able to view lab/test results, encounter notes, upcoming appointments, etc.  Non-urgent messages can be sent to your provider as well.   To learn more about what you can do with MyChart, go to [email or web address].    Your next appointment:   12 month(s)  The format for your next appointment:   In Person  Provider:   Brian Crenshaw, MD   

## 2020-04-26 ENCOUNTER — Other Ambulatory Visit: Payer: Self-pay | Admitting: Family Medicine

## 2020-04-29 NOTE — Telephone Encounter (Signed)
Requesting: zolpidem Contract: will get at next visit UDS: 12/13/18 Last Visit: 11/30/19 Next Visit: 06/18/20 Last Refill: 12/31/19  Please Advise

## 2020-06-03 ENCOUNTER — Other Ambulatory Visit: Payer: Self-pay | Admitting: Family Medicine

## 2020-06-03 ENCOUNTER — Encounter: Payer: Self-pay | Admitting: Family Medicine

## 2020-06-03 MED ORDER — METHYLPREDNISOLONE 4 MG PO TABS: ORAL_TABLET | ORAL | 0 refills | Status: DC

## 2020-06-03 MED ORDER — INDOMETHACIN 50 MG PO CAPS: 50.0000 mg | ORAL_CAPSULE | Freq: Two times a day (BID) | ORAL | 0 refills | Status: DC | PRN

## 2020-06-10 ENCOUNTER — Other Ambulatory Visit (INDEPENDENT_AMBULATORY_CARE_PROVIDER_SITE_OTHER): Payer: Managed Care, Other (non HMO)

## 2020-06-10 ENCOUNTER — Other Ambulatory Visit: Payer: Self-pay

## 2020-06-10 DIAGNOSIS — R739 Hyperglycemia, unspecified: Secondary | ICD-10-CM | POA: Diagnosis not present

## 2020-06-10 DIAGNOSIS — E782 Mixed hyperlipidemia: Secondary | ICD-10-CM

## 2020-06-10 DIAGNOSIS — N529 Male erectile dysfunction, unspecified: Secondary | ICD-10-CM | POA: Diagnosis not present

## 2020-06-10 DIAGNOSIS — Z0000 Encounter for general adult medical examination without abnormal findings: Secondary | ICD-10-CM | POA: Diagnosis not present

## 2020-06-10 DIAGNOSIS — I10 Essential (primary) hypertension: Secondary | ICD-10-CM | POA: Diagnosis not present

## 2020-06-10 LAB — COMPREHENSIVE METABOLIC PANEL
ALT: 25 U/L (ref 0–53)
AST: 25 U/L (ref 0–37)
Albumin: 4.3 g/dL (ref 3.5–5.2)
Alkaline Phosphatase: 92 U/L (ref 39–117)
BUN: 21 mg/dL (ref 6–23)
CO2: 25 mEq/L (ref 19–32)
Calcium: 9.3 mg/dL (ref 8.4–10.5)
Chloride: 105 mEq/L (ref 96–112)
Creatinine, Ser: 1.13 mg/dL (ref 0.40–1.50)
GFR: 70.84 mL/min (ref >60.00)
Glucose, Bld: 97 mg/dL (ref 70–99)
Potassium: 4.6 mEq/L (ref 3.5–5.1)
Sodium: 139 mEq/L (ref 135–145)
Total Bilirubin: 0.5 mg/dL (ref 0.2–1.2)
Total Protein: 7.2 g/dL (ref 6.0–8.3)

## 2020-06-10 LAB — PSA: PSA: 0.4 ng/mL (ref 0.10–4.00)

## 2020-06-10 LAB — CBC
HCT: 39.5 % (ref 39.0–52.0)
Hemoglobin: 12.9 g/dL — ABNORMAL LOW (ref 13.0–17.0)
MCHC: 32.6 g/dL (ref 30.0–36.0)
MCV: 88 fl (ref 78.0–100.0)
Platelets: 252 10*3/uL (ref 150.0–400.0)
RBC: 4.49 Mil/uL (ref 4.22–5.81)
RDW: 15.6 % — ABNORMAL HIGH (ref 11.5–15.5)
WBC: 6.3 10*3/uL (ref 4.0–10.5)

## 2020-06-10 LAB — HEMOGLOBIN A1C: Hgb A1c MFr Bld: 6.1 % (ref 4.6–6.5)

## 2020-06-10 LAB — LIPID PANEL
Cholesterol: 118 mg/dL (ref 0–200)
HDL: 39.5 mg/dL (ref >39.00)
LDL Cholesterol: 53 mg/dL (ref 0–99)
NonHDL: 78.23
Total CHOL/HDL Ratio: 3
Triglycerides: 126 mg/dL (ref 0.0–149.0)
VLDL: 25.2 mg/dL (ref 0.0–40.0)

## 2020-06-10 LAB — TSH: TSH: 2.94 u[IU]/mL (ref 0.35–4.50)

## 2020-06-16 ENCOUNTER — Encounter: Payer: Self-pay | Admitting: Family Medicine

## 2020-06-17 ENCOUNTER — Encounter: Payer: Self-pay | Admitting: Family Medicine

## 2020-06-18 ENCOUNTER — Encounter: Payer: Managed Care, Other (non HMO) | Admitting: Family Medicine

## 2020-07-02 MED ORDER — ZOLPIDEM TARTRATE 10 MG PO TABS: 5.0000 mg | ORAL_TABLET | Freq: Every evening | ORAL | 1 refills | Status: DC | PRN

## 2020-07-02 NOTE — Telephone Encounter (Signed)
VV scheduled for 07/08/20

## 2020-07-08 ENCOUNTER — Other Ambulatory Visit: Payer: Self-pay

## 2020-07-08 ENCOUNTER — Telehealth (INDEPENDENT_AMBULATORY_CARE_PROVIDER_SITE_OTHER): Payer: Managed Care, Other (non HMO) | Admitting: Family Medicine

## 2020-07-08 DIAGNOSIS — R739 Hyperglycemia, unspecified: Secondary | ICD-10-CM | POA: Diagnosis not present

## 2020-07-08 DIAGNOSIS — L409 Psoriasis, unspecified: Secondary | ICD-10-CM

## 2020-07-08 DIAGNOSIS — G4700 Insomnia, unspecified: Secondary | ICD-10-CM

## 2020-07-08 DIAGNOSIS — E782 Mixed hyperlipidemia: Secondary | ICD-10-CM | POA: Diagnosis not present

## 2020-07-08 DIAGNOSIS — I10 Essential (primary) hypertension: Secondary | ICD-10-CM

## 2020-07-08 MED ORDER — METHYLPREDNISOLONE 4 MG PO TABS: ORAL_TABLET | ORAL | 0 refills | Status: DC

## 2020-07-08 MED ORDER — CLOBETASOL PROPIONATE 0.05 % EX OINT: 1.0000 "application " | TOPICAL_OINTMENT | Freq: Two times a day (BID) | CUTANEOUS | 2 refills | Status: AC | PRN

## 2020-07-08 MED ORDER — ZOLPIDEM TARTRATE 10 MG PO TABS: 5.0000 mg | ORAL_TABLET | Freq: Every evening | ORAL | 5 refills | Status: DC | PRN

## 2020-07-08 MED ORDER — TIZANIDINE HCL 2 MG PO TABS: 1.0000 mg | ORAL_TABLET | Freq: Three times a day (TID) | ORAL | 2 refills | Status: DC | PRN

## 2020-07-08 NOTE — Assessment & Plan Note (Signed)
hgba1c acceptable, minimize simple carbs. Increase exercise as tolerated.  

## 2020-07-08 NOTE — Assessment & Plan Note (Addendum)
Monitor and report any concerns, no changes to meds. Encouraged heart healthy diet such as the DASH diet and exercise as tolerated.  ?

## 2020-07-08 NOTE — Assessment & Plan Note (Signed)
Encouraged heart healthy diet, increase exercise, avoid trans fats, consider a krill oil cap daily 

## 2020-07-12 NOTE — Assessment & Plan Note (Signed)
Flared on right leg clobetasol cream and medrol dosepak if worsens while at the beach

## 2020-07-12 NOTE — Progress Notes (Signed)
MyChart Video Visit    Virtual Visit via Video Note   This visit type was conducted due to national recommendations for restrictions regarding the COVID-19 Pandemic (e.g. social distancing) in an effort to limit this patient's exposure and mitigate transmission in our community. This patient is at least at moderate risk for complications without adequate follow up. This format is felt to be most appropriate for this patient at this time. Physical exam was limited by quality of the video and audio technology used for the visit. I was able to get the patient set up on a video visit.  Patient location: Home, Patient and provider in visit Provider location: Office  I discussed the limitations of evaluation and management by telemedicine and the availability of in person appointments. The patient expressed understanding and agreed to proceed.  Visit Date: 07/08/2020  Today's healthcare provider: Penni Homans, MD     Subjective:    Patient ID: Dakota [name], male    DOB: [birth year], 60 y.o.   MRN: [id number]  Chief Complaint  Patient presents with   Medication Refill    Pt has no concerns or problems.    HPI Patient is in today for follow up on chronic medical concerns. Including hypertension, psoriasis and more. No recent febrile illness or hospitalizations. No polyuria or polydipsia. His psoriasis has flared on his right leg and this tend to flare in the sun. No c/o polyuria or polydipsia. Denies CP/palp/SOB/HA/congestion/fevers/GI or GU c/o. 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   Kidney stone on left side 09/19/2013   Left knee pain 12/25/2012   Mumps as a child   Onychomycosis 09/30/2014   Overweight 12/16/2015   Preventative health care 09/24/2013    Past Surgical History:  Procedure Laterality Date   COLONOSCOPY     COLONOSCOPY WITH PROPOFOL N/A 12/04/2019   Procedure: COLONOSCOPY WITH PROPOFOL;  Surgeon: Irving Copas., MD;  Location: [place];  Service: Gastroenterology;  Laterality: N/A;  ultra slim scope    CORONARY STENT PLACEMENT  01-14-05   LITHOTRIPSY  2/16   POLYPECTOMY  12/04/2019   Procedure: POLYPECTOMY;  Surgeon: [name], Telford Nab., MD;  Location: [facility] END[facility]COPY;  Service: Gastroenterology;;    Family History  Problem Relation Age of Onset   Multiple sclerosis Mother    Cancer Father        esophagus/ brain   Alcohol abuse Father        smoker   Esophageal cancer Father    Seizures Sister        AVM   COPD Sister    Alcohol abuse Sister        addiction, recovered   Heart disease Maternal Grandfather        MI   Cancer Paternal Aunt        leukemia   Colon cancer Neg Hx    Colon polyps Neg Hx    Rectal cancer Neg Hx    Stomach cancer  Neg Hx     Social History   Socioeconomic History   Marital status: Married    Spouse name: Not on file   Number of children: 2   Years of education: Not on file   Highest education level: Not on file  Occupational History   Occupation: real Investment banker, corporate  Tobacco Use   Smoking status: Former    Packs/day: 0.50    Years: 20.00    Pack years: 10.00    Types: Cigarettes    Quit date: 02/20/2015    Years since quitting: 5.3   Smokeless tobacco: Never  Vaping Use   Vaping Use: Every day  Substance and Sexual Activity   Alcohol use: Yes    Comment: 10 drinks per week   Drug use: No   Sexual activity: Yes    Partners: Female    Comment: wife, real Investment banker, corporate, no dairy, minimal fried foods  Other Topics Concern   Not on file  Social History  Narrative   1 biological son   1 adopted child   3 step children   Social Determinants of Health   Financial Resource Strain: Not on file  Food Insecurity: Not on file  Transportation Needs: Not on file  Physical Activity: Not on file  Stress: Not on file  Social Connections: Not on file  Intimate Partner Violence: Not on file    Outpatient Medications Prior to Visit  Medication Sig Dispense Refill   aspirin EC 81 MG tablet Take 1 tablet (81 mg total) by mouth daily. Swallow whole. 90 tablet 3   atorvastatin (LIPITOR) 80 MG tablet TAKE 1 TABLET DAILY AT 6 P.M. 60 tablet 1   buPROPion (WELLBUTRIN XL) 300 MG 24 hr tablet TAKE 1 TABLET BY MOUTH DAILY (Patient taking differently: Take 300 mg by mouth daily.) 90 tablet 3   Calcipotriene-Betameth Diprop (ENSTILAR) 0.005-0.064 % FOAM Apply 1 application topically daily as needed (psoriasis).     cetirizine (ZYRTEC) 10 MG tablet Take 10 mg by mouth daily as needed for allergies.     Coenzyme Q10 300 MG CAPS Take 300 mg by mouth every other day.     indomethacin (INDOCIN) 50 MG capsule Take 1 capsule (50 mg total) by mouth 2 (two) times daily as needed. 30 capsule 0   lansoprazole (PREVACID) 15 MG capsule Take 15 mg by mouth daily as needed (acid reflux).     Melatonin 10 MG CAPS Take 10 mg by mouth at bedtime as needed (sleep).     metoprolol succinate (TOPROL-XL) 50 MG 24 hr tablet TAKE 1 TABLET DAILY TAKE WITH OR IMMEDIATELY FOLLOWING A MEAL 60 tablet 1   Multiple Vitamin (MULTIVITAMIN WITH MINERALS) TABS tablet Take 1 tablet by mouth daily.     mupirocin ointment (BACTROBAN) 2 % Place 1 application into the nose 2 (two) times daily. (Patient taking differently: Place 1 application into the nose 2 (two) times daily as needed (wound care).) 30 g 1   Probiotic Product (PROBIOTIC PO) Take 1 capsule by mouth every other day.     rOPINIRole (REQUIP) 0.5 MG tablet Take 1-2 tablets (0.5-1 mg total) by mouth at bedtime. 60 tablet 2   clobetasol  ointment (TEMOVATE) 2.40 % Apply 1 application topically 2 (two) times daily as needed (psoriasis).     methylPREDNISolone (MEDROL) 4 MG tablet 5 tab po qd X 1d then 4 tab po qd X 1d then 3 tab po qd X 1d then 2 tab po qd then 1  tab po qd 15 tablet 0   tiZANidine (ZANAFLEX) 2 MG tablet Take 0.5-2 tablets (1-4 mg total) by mouth every 8 (eight) hours as needed for muscle spasms. 40 tablet 1   zolpidem (AMBIEN) 10 MG tablet Take 0.5-1 tablets (5-10 mg total) by mouth at bedtime as needed. for sleep 30 tablet 1   ezetimibe (ZETIA) 10 MG tablet Take 1 tablet (10 mg total) by mouth daily. (Patient taking differently: Take 10 mg by mouth every other day. ) 90 tablet 3   losartan (COZAAR) 50 MG tablet Take 1 tablet (50 mg total) by mouth daily. 90 tablet 3   No facility-administered medications prior to visit.    Allergies  Allergen Reactions   Neosporin [Neomycin-Bacitracin Zn-Polymyx] Hives and Itching   Polysporin [Bacitracin-Polymyxin B] Hives and Itching   Amoxicillin Itching and Rash    Review of Systems  Constitutional:  Negative for fever and malaise/fatigue.  HENT:  Negative for congestion.   Eyes:  Negative for blurred vision.  Respiratory:  Negative for shortness of breath.   Cardiovascular:  Negative for chest pain, palpitations and leg swelling.  Gastrointestinal:  Negative for abdominal pain, blood in stool and nausea.  Genitourinary:  Negative for dysuria and frequency.  Musculoskeletal:  Negative for falls.  Skin:  Positive for itching and rash.  Neurological:  Negative for dizziness, loss of consciousness and headaches.  Endo/Heme/Allergies:  Negative for environmental allergies.  Psychiatric/Behavioral:  Negative for depression. The patient is not nervous/anxious.       Objective:    Physical Exam Constitutional:      General: He is not in acute distress.    Appearance: Normal appearance. He is not ill-appearing or toxic-appearing.  HENT:     Head: Normocephalic and  atraumatic.     Right Ear: External ear normal.     Left Ear: External ear normal.     Nose: Nose normal.  Eyes:     General:        Right eye: No discharge.        Left eye: No discharge.  Pulmonary:     Effort: Pulmonary effort is normal.  Skin:    Findings: No rash.  Neurological:     Mental Status: He is alert and oriented to person, place, and time.  Psychiatric:        Behavior: Behavior normal.    There were no vitals taken for this visit. Wt Readings from Last 3 Encounters:  04/17/20 212 lb (96.2 kg)  12/04/19 212 lb 4.9 oz (96.3 kg)  11/30/19 212 lb 6.4 oz (96.3 kg)    Diabetic Foot Exam - Simple   No data filed    Lab Results  Component Value Date   WBC 6.3 06/10/2020   HGB 12.9 (L) 06/10/2020   HCT 39.5 06/10/2020   PLT 252.0 06/10/2020   GLUCOSE 97 06/10/2020   CHOL 118 06/10/2020   TRIG 126.0 06/10/2020   HDL 39.50 06/10/2020   LDLDIRECT 149.4 09/13/2007   LDLCALC 53 06/10/2020   ALT 25 06/10/2020   AST 25 06/10/2020   NA 139 06/10/2020   K 4.6 06/10/2020   CL 105 06/10/2020   CREATININE 1.13 06/10/2020   BUN 21 06/10/2020   CO2 25 06/10/2020   TSH 2.94 06/10/2020   PSA 0.40 06/10/2020   HGBA1C 6.1 06/10/2020    Lab Results  Component Value Date   TSH 2.94 06/10/2020   Lab Results  Component Value Date   WBC 6.3 06/10/2020  HGB 12.9 (L) 06/10/2020   HCT 39.5 06/10/2020   MCV 88.0 06/10/2020   PLT 252.0 06/10/2020   Lab Results  Component Value Date   NA 139 06/10/2020   K 4.6 06/10/2020   CO2 25 06/10/2020   GLUCOSE 97 06/10/2020   BUN 21 06/10/2020   CREATININE 1.13 06/10/2020   BILITOT 0.5 06/10/2020   ALKPHOS 92 06/10/2020   AST 25 06/10/2020   ALT 25 06/10/2020   PROT 7.2 06/10/2020   ALBUMIN 4.3 06/10/2020   CALCIUM 9.3 06/10/2020   ANIONGAP 8 03/11/2014   GFR 70.84 06/10/2020   Lab Results  Component Value Date   CHOL 118 06/10/2020   Lab Results  Component Value Date   HDL 39.50 06/10/2020   Lab Results   Component Value Date   LDLCALC 53 06/10/2020   Lab Results  Component Value Date   TRIG 126.0 06/10/2020   Lab Results  Component Value Date   CHOLHDL 3 06/10/2020   Lab Results  Component Value Date   HGBA1C 6.1 06/10/2020       Assessment & Plan:   Problem List Items Addressed This Visit     Hyperlipidemia, mixed    Encouraged heart healthy diet, increase exercise, avoid trans fats, consider a krill oil cap daily       Essential hypertension    Monitor and report any concerns, no changes to meds. Encouraged heart healthy diet such as the DASH diet and exercise as tolerated.        Insomnia    Encouraged good sleep hygiene such as dark, quiet room. No blue/green glowing lights such as computer screens in bedroom. No alcohol or stimulants in evening. Cut down on caffeine as able. Regular exercise is helpful but not just prior to bed time. Refill given on Ambien       Hyperglycemia    hgba1c acceptable, minimize simple carbs. Increase exercise as tolerated.        Psoriasis    Flared on right leg clobetasol cream and medrol dosepak if worsens while at the beach        I am having Dakota [name] maintain his mupirocin ointment, ezetimibe, losartan, buPROPion, multivitamin with minerals, cetirizine, Probiotic Product (PROBIOTIC PO), lansoprazole, Coenzyme Q10, Melatonin, Enstilar, rOPINIRole, atorvastatin, metoprolol succinate, aspirin EC, indomethacin, clobetasol ointment, tiZANidine, methylPREDNISolone, and zolpidem.  Meds ordered this encounter  Medications   clobetasol ointment (TEMOVATE) 0.05 %    Sig: Apply 1 application topically 2 (two) times daily as needed (psoriasis).    Dispense:  60 g    Refill:  2   tiZANidine (ZANAFLEX) 2 MG tablet    Sig: Take 0.5-2 tablets (1-4 mg total) by mouth every 8 (eight) hours as needed for muscle spasms.    Dispense:  40 tablet    Refill:  2   methylPREDNISolone (MEDROL) 4 MG tablet    Sig: 5 tab po qd X 1d then 4  tab po qd X 1d then 3 tab po qd X 1d then 2 tab po qd then 1 tab po qd    Dispense:  15 tablet    Refill:  0   zolpidem (AMBIEN) 10 MG tablet    Sig: Take 0.5-1 tablets (5-10 mg total) by mouth at bedtime as needed. for sleep    Dispense:  30 tablet    Refill:  5    I discussed the assessment and treatment plan with the patient. The patient was provided an opportunity to ask  questions and all were answered. The patient agreed with the plan and demonstrated an understanding of the instructions.   The patient was advised to call back or seek an in-person evaluation if the symptoms worsen or if the condition fails to improve as anticipated.  I provided 26 minutes of face-to-face time during this encounter.   Penni Homans, MD [facility] at [facility] [phone] (phone) [phone] (fax)  [place]

## 2020-07-12 NOTE — Assessment & Plan Note (Signed)
Encouraged good sleep hygiene such as dark, quiet room. No blue/green glowing lights such as computer screens in bedroom. No alcohol or stimulants in evening. Cut down on caffeine as able. Regular exercise is helpful but not just prior to bed time. Refill given on Ambien

## 2020-08-11 ENCOUNTER — Other Ambulatory Visit: Payer: Self-pay | Admitting: Cardiology

## 2020-08-11 ENCOUNTER — Encounter: Payer: Self-pay | Admitting: Family Medicine

## 2020-08-12 ENCOUNTER — Other Ambulatory Visit: Payer: Self-pay | Admitting: Family Medicine

## 2020-08-12 MED ORDER — METHYLPREDNISOLONE 4 MG PO TABS: ORAL_TABLET | ORAL | 0 refills | Status: DC

## 2020-08-14 MED ORDER — METOPROLOL SUCCINATE ER 50 MG PO TB24: ORAL_TABLET | ORAL | 3 refills | Status: DC

## 2020-08-19 ENCOUNTER — Other Ambulatory Visit: Payer: Self-pay | Admitting: *Deleted

## 2020-08-19 MED ORDER — INDOMETHACIN 50 MG PO CAPS: 50.0000 mg | ORAL_CAPSULE | Freq: Two times a day (BID) | ORAL | 0 refills | Status: DC | PRN

## 2020-09-19 ENCOUNTER — Ambulatory Visit: Payer: Managed Care, Other (non HMO) | Admitting: Family Medicine

## 2020-09-19 NOTE — Progress Notes (Deleted)
Dakota [name] - 60 y.o. male MRN [id number]  Date of birth: Feb 21, 1960  SUBJECTIVE:  Including CC & ROS.  No chief complaint on file.   Dakota [name] is a 60 y.o. male that is  ***.  ***   Review of Systems See HPI   HISTORY: Past Medical, Surgical, Social, and Family History Reviewed & Updated per EMR.   Pertinent Historical Findings include:  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   Kidney stone on left side 09/19/2013   Left knee pain 12/25/2012   Mumps as a child   Onychomycosis 09/30/2014   Overweight 12/16/2015   Preventative health care 09/24/2013    Past Surgical History:  Procedure Laterality Date   COLONOSCOPY     COLONOSCOPY WITH PROPOFOL N/A 12/04/2019   Procedure: COLONOSCOPY WITH PROPOFOL;  Surgeon: Irving Copas., MD;  Location: [place];  Service: Gastroenterology;  Laterality: N/A;  ultra slim scope    CORONARY STENT PLACEMENT  01-14-05   LITHOTRIPSY  2/16   POLYPECTOMY  12/04/2019   Procedure: POLYPECTOMY;  Surgeon: [name], Telford Nab., MD;  Location: [facility] ENDOSCOPY;  Service: Gastroenterology;;    Family History  Problem Relation Age of Onset   Multiple sclerosis Mother    Cancer Father        esophagus/ brain   Alcohol abuse Father        smoker   Esophageal cancer Father    Seizures Sister        AVM   COPD Sister    Alcohol abuse Sister        addiction, recovered   Heart disease Maternal Grandfather        MI   Cancer Paternal Aunt        leukemia   Colon cancer Neg Hx    Colon polyps Neg Hx    Rectal cancer Neg Hx    Stomach cancer  Neg Hx     Social History   Socioeconomic History   Marital status: Married    Spouse name: Not on file   Number of children: 2   Years of education: Not on file   Highest education level: Not on file  Occupational History   Occupation: real Investment banker, corporate  Tobacco Use   Smoking status: Former    Packs/day: 0.50    Years: 20.00    Pack years: 10.00    Types: Cigarettes    Quit date: 02/20/2015    Years since quitting: 5.5   Smokeless tobacco: Never  Vaping Use   Vaping Use: Every day  Substance and Sexual Activity   Alcohol use: Yes    Comment: 10 drinks per week   Drug use: No   Sexual activity: Yes    Partners: Female    Comment: wife, real Investment banker, corporate, no dairy, minimal fried foods  Other Topics Concern   Not on file  Social History Narrative   1 biological son   1 adopted child   3 step children  Social Determinants of Health   Financial Resource Strain: Not on file  Food Insecurity: Not on file  Transportation Needs: Not on file  Physical Activity: Not on file  Stress: Not on file  Social Connections: Not on file  Intimate Partner Violence: Not on file     PHYSICAL EXAM:  VS: There were no vitals taken for this visit. Physical Exam Gen: NAD, alert, cooperative with exam, well-appearing MSK:  ***      ASSESSMENT & PLAN:   No problem-specific Assessment & Plan notes found for this encounter.

## 2020-10-30 ENCOUNTER — Other Ambulatory Visit: Payer: Self-pay | Admitting: Family Medicine

## 2020-10-30 ENCOUNTER — Encounter: Payer: Self-pay | Admitting: Family Medicine

## 2020-10-30 MED ORDER — ALLOPURINOL 100 MG PO TABS: 100.0000 mg | ORAL_TABLET | Freq: Every day | ORAL | 3 refills | Status: DC

## 2020-10-30 MED ORDER — METHYLPREDNISOLONE 4 MG PO TABS: ORAL_TABLET | ORAL | 0 refills | Status: DC

## 2020-11-02 ENCOUNTER — Other Ambulatory Visit: Payer: Self-pay | Admitting: Cardiology

## 2020-11-02 ENCOUNTER — Other Ambulatory Visit: Payer: Self-pay

## 2020-11-04 ENCOUNTER — Encounter: Payer: Self-pay | Admitting: *Deleted

## 2020-11-04 MED ORDER — EZETIMIBE 10 MG PO TABS: 10.0000 mg | ORAL_TABLET | Freq: Every day | ORAL | 3 refills | Status: DC

## 2020-12-20 NOTE — Progress Notes (Signed)
[place] Phone: [phone] Subjective:   Dakota [name], am serving as a scribe for Dr. Hulan Saas.  This visit occurred during the SARS-CoV-2 public health emergency.  Safety protocols were in place, including screening questions prior to the visit, additional usage of staff PPE, and extensive cleaning of exam room while observing appropriate contact time as indicated for disinfecting solutions.   I'm seeing this patient by the request  of:  Dakota Lukes, MD  CC: Right foot pain  Dakota [name]  Dakota [name] is a 60 y.o. male coming in with complaint of R foot pain for past 2-3 weeks over distal peroneal tendon and insertion. Diagnosed with Achilles tendonitis of L ankle by another provider. Patient states that pain is constant and sharp with weight bearing. History of gout. Pain subsides when he sits down but is painful to put on his shoe.   L foot xray 09/19/2020 These show [name] acute  osseous abnormality. Moderate hallux MTP joint degenerative changes.  Moderate sized superior and plantar calcaneal enthesophytes. Mild midfoot  joint degenerative change.  This is on the contralateral foot compared to the one he is having pain there.     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   Kidney stone on left side 09/19/2013   Left knee pain 12/25/2012   Mumps as a child   Onychomycosis 09/30/2014   Overweight 12/16/2015   Preventative health care 09/24/2013   Past Surgical History:  Procedure Laterality Date   COLONOSCOPY      COLONOSCOPY WITH PROPOFOL N/A 12/04/2019   Procedure: COLONOSCOPY WITH PROPOFOL;  Surgeon: Irving Copas., MD;  Location: [place];  Service: Gastroenterology;  Laterality: N/A;  ultra slim scope    CORONARY STENT PLACEMENT  01-14-05   LITHOTRIPSY  2/16   POLYPECTOMY  12/04/2019   Procedure: POLYPECTOMY;  Surgeon: [name], Telford Nab., MD;  Location: [facility];  Service: Gastroenterology;;   Social History   Socioeconomic History   Marital status: Married    Spouse name: Not on file   Number of children: 2   Years of education: Not on file   Highest education level: Not on file  Occupational History   Occupation: real Investment banker, corporate  Tobacco Use   Smoking status: Former    Packs/day: 0.50    Years: 20.00    Pack years: 10.00    Types: Cigarettes    Quit date: 02/20/2015    Years since quitting: 5.8   Smokeless tobacco: Never  Vaping Use   Vaping Use: Every day  Substance and Sexual Activity   Alcohol use: Yes    Comment: 10 drinks per week   Drug use: [name]   Sexual activity: Yes    Partners: Female    Comment: wife, real Investment banker, corporate, [name] dairy, minimal fried foods  Other Topics Concern   Not on file  Social History Narrative   1 biological son   1 adopted child  3 step children   Social Determinants of Health   Financial Resource Strain: Not on file  Food Insecurity: Not on file  Transportation Needs: Not on file  Physical Activity: Not on file  Stress: Not on file  Social Connections: Not on file   Allergies  Allergen Reactions   Neosporin [Neomycin-Bacitracin Zn-Polymyx] Hives and Itching   Polysporin [Bacitracin-Polymyxin B] Hives and Itching   Amoxicillin Itching and Rash   Family History  Problem Relation Age of Onset   Multiple sclerosis Mother    Cancer Father        esophagus/ brain   Alcohol abuse Father        smoker   Esophageal cancer Father    Seizures Sister        AVM   COPD Sister    Alcohol abuse Sister         addiction, recovered   Heart disease Maternal Grandfather        MI   Cancer Paternal Aunt        leukemia   Colon cancer Neg Hx    Colon polyps Neg Hx    Rectal cancer Neg Hx    Stomach cancer Neg Hx     Current Outpatient Medications (Endocrine & Metabolic):    methylPREDNISolone (MEDROL) 4 MG tablet, 5 tab po qd X 1d then 4 tab po qd X 1d then 3 tab po qd X 1d then 2 tab po qd then 1 tab po qd  Current Outpatient Medications (Cardiovascular):    atorvastatin (LIPITOR) 80 MG tablet, TAKE 1 TABLET DAILY AT 6 P.M. (SCHEDULE AN APPOINTMENT FOR FUTURE REFILLS)   ezetimibe (ZETIA) 10 MG tablet, Take 1 tablet (10 mg total) by mouth daily.   metoprolol succinate (TOPROL-XL) 50 MG 24 hr tablet, TAKE 1 TABLET DAILY TAKE WITH OR IMMEDIATELY FOLLOWING A MEAL   losartan (COZAAR) 50 MG tablet, Take 1 tablet (50 mg total) by mouth daily.  Current Outpatient Medications (Respiratory):    cetirizine (ZYRTEC) 10 MG tablet, Take 10 mg by mouth daily as needed for allergies.  Current Outpatient Medications (Analgesics):    allopurinol (ZYLOPRIM) 100 MG tablet, Take 1 tablet (100 mg total) by mouth daily.   aspirin EC 81 MG tablet, Take 1 tablet (81 mg total) by mouth daily. Swallow whole.   indomethacin (INDOCIN) 50 MG capsule, Take 1 capsule (50 mg total) by mouth 2 (two) times daily as needed.   Current Outpatient Medications (Other):    buPROPion (WELLBUTRIN XL) 300 MG 24 hr tablet, TAKE 1 TABLET BY MOUTH DAILY (Patient taking differently: Take 300 mg by mouth daily.)   Calcipotriene-Betameth Diprop (ENSTILAR) 0.005-0.064 % FOAM, Apply 1 application topically daily as needed (psoriasis).   clobetasol ointment (TEMOVATE) 2.42 %, Apply 1 application topically 2 (two) times daily as needed (psoriasis).   Coenzyme Q10 300 MG CAPS, Take 300 mg by mouth every other day.   lansoprazole (PREVACID) 15 MG capsule, Take 15 mg by mouth daily as needed (acid reflux).   Melatonin 10 MG CAPS, Take 10 mg by  mouth at bedtime as needed (sleep).   Multiple Vitamin (MULTIVITAMIN WITH MINERALS) TABS tablet, Take 1 tablet by mouth daily.   mupirocin ointment (BACTROBAN) 2 %, Place 1 application into the nose 2 (two) times daily. (Patient taking differently: Place 1 application into the nose 2 (two) times daily as needed (wound care).)   rOPINIRole (REQUIP) 0.5 MG tablet, Take 1-2 tablets (0.5-1 mg total) by mouth at bedtime.  tiZANidine (ZANAFLEX) 2 MG tablet, Take 0.5-2 tablets (1-4 mg total) by mouth every 8 (eight) hours as needed for muscle spasms.   zolpidem (AMBIEN) 10 MG tablet, Take 0.5-1 tablets (5-10 mg total) by mouth at bedtime as needed. for sleep   Probiotic Product (PROBIOTIC PO), Take 1 capsule by mouth every other day.   Reviewed prior external information including notes and imaging from  primary care provider As well as notes that were available from care everywhere and other healthcare systems.  Past medical history, social, surgical and family history all reviewed in electronic medical record.  [name] pertanent information unless stated regarding to the chief complaint.   Review of Systems:  [name] headache, visual changes, nausea, vomiting, diarrhea, constipation, dizziness, abdominal pain, skin rash, fevers, chills, night sweats, weight loss, swollen lymph nodes, body aches, joint swelling, chest pain, shortness of breath, mood changes. POSITIVE muscle aches  Objective  Blood pressure (!) 136/92, pulse 68, height 6' (1.829 m), weight 212 lb (96.2 kg), SpO2 97 %.   General: [name] apparent distress alert and oriented x3 mood and affect normal, dressed appropriately.  HEENT: Pupils equal, extraocular movements intact  Respiratory: Patient's speak in full sentences and does not appear short of breath  Cardiovascular: [name] lower extremity edema, non tender, [name] erythema  Gait normal with good balance and coordination.  MSK: Right foot exam shows the patient does have some swelling noted over  the fifth metatarsal as well as over the anterior lateral aspect of the foot.  Patient's ankle is nontender with [name] pain over the lateral malleolus of the peroneal tendons.  Patient does have tenderness to palpation at the base of the fifth metatarsal.  Neurovascularly intact distally.  Limited muscular skeletal ultrasound was performed and interpreted by Hulan Saas, M  Limited musculoskeletal ultrasound shows patient does have a cortical irregularity noted over the base of the fifth metatarsal.  Does seem to have a soft callus formation noted as well as some mild hard callus formation still noted.  Some increasing in neovascularization in Doppler flow in the surrounding area consistent with a stress reaction versus healing stress fracture. Impression: Likely stress reaction.    Impression and Recommendations:     The above documentation has been reviewed and is accurate and complete Lyndal Pulley, DO

## 2020-12-23 ENCOUNTER — Encounter: Payer: Self-pay | Admitting: Family Medicine

## 2020-12-23 ENCOUNTER — Ambulatory Visit: Payer: Managed Care, Other (non HMO) | Admitting: Family Medicine

## 2020-12-23 ENCOUNTER — Other Ambulatory Visit: Payer: Self-pay

## 2020-12-23 ENCOUNTER — Ambulatory Visit: Payer: Self-pay

## 2020-12-23 ENCOUNTER — Ambulatory Visit (INDEPENDENT_AMBULATORY_CARE_PROVIDER_SITE_OTHER): Payer: Managed Care, Other (non HMO) | Admitting: Family Medicine

## 2020-12-23 VITALS — BP 136/92 | HR 68 | Ht 72.0 in | Wt 212.0 lb

## 2020-12-23 DIAGNOSIS — M79671 Pain in right foot: Secondary | ICD-10-CM | POA: Diagnosis not present

## 2020-12-23 DIAGNOSIS — M79672 Pain in left foot: Secondary | ICD-10-CM

## 2020-12-23 NOTE — Patient Instructions (Signed)
OOFOS or HOKA Recovery Sandals Voltaren gel Ice Continue exercises  Rigid soled shoes See me in 6 weeks

## 2020-12-23 NOTE — Assessment & Plan Note (Signed)
Patient does have what appears to be more of 1/5 stress reaction or stress fracture.  At this moment.  Seems to be proximal.  Does have some mild swelling.  We discussed potential advanced imaging but patient declined even the x-ray at the moment.  Patient would like to try conservative therapy.  Discussed rigid soled shoes, avoiding being barefoot, increasing vitamin D, icing regimen.  Patient does have a past medical history significant for gout.  Patient also has a significant history of a deep venous thrombosis.  Discussed the possibility of further imaging for this which patient declined as well.  Do not think that this is likely with no calf pain.  Patient will try the conservative therapy and follow-up with me again in 6  weeks.  Worsening pain I would like to see patient sooner.

## 2021-01-01 ENCOUNTER — Other Ambulatory Visit: Payer: Self-pay

## 2021-01-01 MED ORDER — ALLOPURINOL 100 MG PO TABS: 100.0000 mg | ORAL_TABLET | Freq: Every day | ORAL | 1 refills | Status: DC

## 2021-01-16 ENCOUNTER — Encounter: Payer: Self-pay | Admitting: Family Medicine

## 2021-01-17 ENCOUNTER — Other Ambulatory Visit: Payer: Self-pay

## 2021-01-17 DIAGNOSIS — I10 Essential (primary) hypertension: Secondary | ICD-10-CM

## 2021-01-17 DIAGNOSIS — E782 Mixed hyperlipidemia: Secondary | ICD-10-CM

## 2021-01-17 DIAGNOSIS — R739 Hyperglycemia, unspecified: Secondary | ICD-10-CM

## 2021-01-17 NOTE — Telephone Encounter (Signed)
done

## 2021-01-20 ENCOUNTER — Ambulatory Visit: Payer: Managed Care, Other (non HMO)

## 2021-01-20 ENCOUNTER — Other Ambulatory Visit: Payer: Self-pay

## 2021-01-20 DIAGNOSIS — M79672 Pain in left foot: Secondary | ICD-10-CM

## 2021-01-21 ENCOUNTER — Ambulatory Visit (INDEPENDENT_AMBULATORY_CARE_PROVIDER_SITE_OTHER): Payer: Managed Care, Other (non HMO)

## 2021-01-21 ENCOUNTER — Other Ambulatory Visit (INDEPENDENT_AMBULATORY_CARE_PROVIDER_SITE_OTHER): Payer: Managed Care, Other (non HMO)

## 2021-01-21 DIAGNOSIS — M79671 Pain in right foot: Secondary | ICD-10-CM

## 2021-01-21 DIAGNOSIS — E782 Mixed hyperlipidemia: Secondary | ICD-10-CM

## 2021-01-21 DIAGNOSIS — M79672 Pain in left foot: Secondary | ICD-10-CM | POA: Diagnosis not present

## 2021-01-21 DIAGNOSIS — I10 Essential (primary) hypertension: Secondary | ICD-10-CM

## 2021-01-21 DIAGNOSIS — R739 Hyperglycemia, unspecified: Secondary | ICD-10-CM | POA: Diagnosis not present

## 2021-01-21 LAB — CBC WITH DIFFERENTIAL/PLATELET
Basophils Absolute: 0.1 10*3/uL (ref 0.0–0.1)
Basophils Relative: 1.7 % (ref 0.0–3.0)
Eosinophils Absolute: 0.2 10*3/uL (ref 0.0–0.7)
Eosinophils Relative: 2.5 % (ref 0.0–5.0)
HCT: 41.4 % (ref 39.0–52.0)
Hemoglobin: 13.1 g/dL (ref 13.0–17.0)
Lymphocytes Relative: 37.3 % (ref 12.0–46.0)
Lymphs Abs: 2.7 10*3/uL (ref 0.7–4.0)
MCHC: 31.7 g/dL (ref 30.0–36.0)
MCV: 87.3 fl (ref 78.0–100.0)
Monocytes Absolute: 0.8 10*3/uL (ref 0.1–1.0)
Monocytes Relative: 10.4 % (ref 3.0–12.0)
Neutro Abs: 3.5 10*3/uL (ref 1.4–7.7)
Neutrophils Relative %: 48.1 % (ref 43.0–77.0)
Platelets: 281 10*3/uL (ref 150.0–400.0)
RBC: 4.74 Mil/uL (ref 4.22–5.81)
RDW: 16 % — ABNORMAL HIGH (ref 11.5–15.5)
WBC: 7.2 10*3/uL (ref 4.0–10.5)

## 2021-01-21 LAB — LIPID PANEL
Cholesterol: 131 mg/dL (ref 0–200)
HDL: 38.4 mg/dL — ABNORMAL LOW (ref >39.00)
LDL Cholesterol: 54 mg/dL (ref 0–99)
NonHDL: 92.58
Total CHOL/HDL Ratio: 3
Triglycerides: 193 mg/dL — ABNORMAL HIGH (ref 0.0–149.0)
VLDL: 38.6 mg/dL (ref 0.0–40.0)

## 2021-01-21 LAB — COMPREHENSIVE METABOLIC PANEL
ALT: 22 U/L (ref 0–53)
AST: 17 U/L (ref 0–37)
Albumin: 4.2 g/dL (ref 3.5–5.2)
Alkaline Phosphatase: 74 U/L (ref 39–117)
BUN: 17 mg/dL (ref 6–23)
CO2: 31 mEq/L (ref 19–32)
Calcium: 9.5 mg/dL (ref 8.4–10.5)
Chloride: 101 mEq/L (ref 96–112)
Creatinine, Ser: 1.08 mg/dL (ref 0.40–1.50)
GFR: 74.48 mL/min (ref >60.00)
Glucose, Bld: 90 mg/dL (ref 70–99)
Potassium: 4.6 mEq/L (ref 3.5–5.1)
Sodium: 139 mEq/L (ref 135–145)
Total Bilirubin: 0.5 mg/dL (ref 0.2–1.2)
Total Protein: 7.4 g/dL (ref 6.0–8.3)

## 2021-01-21 LAB — TSH: TSH: 2.94 u[IU]/mL (ref 0.35–5.50)

## 2021-01-21 LAB — HEMOGLOBIN A1C: Hgb A1c MFr Bld: 6.1 % (ref 4.6–6.5)

## 2021-01-21 IMAGING — DX DG FOOT COMPLETE 3+V*R*
3 series · 3 of 3 positions shown · non-contrast
Comparison: None.

CLINICAL DATA: Bilateral foot pain for 2 months, left greater than
right

EXAM:
LEFT FOOT - COMPLETE 3+ VIEW; RIGHT FOOT COMPLETE - 3+ VIEW

[foot ap]
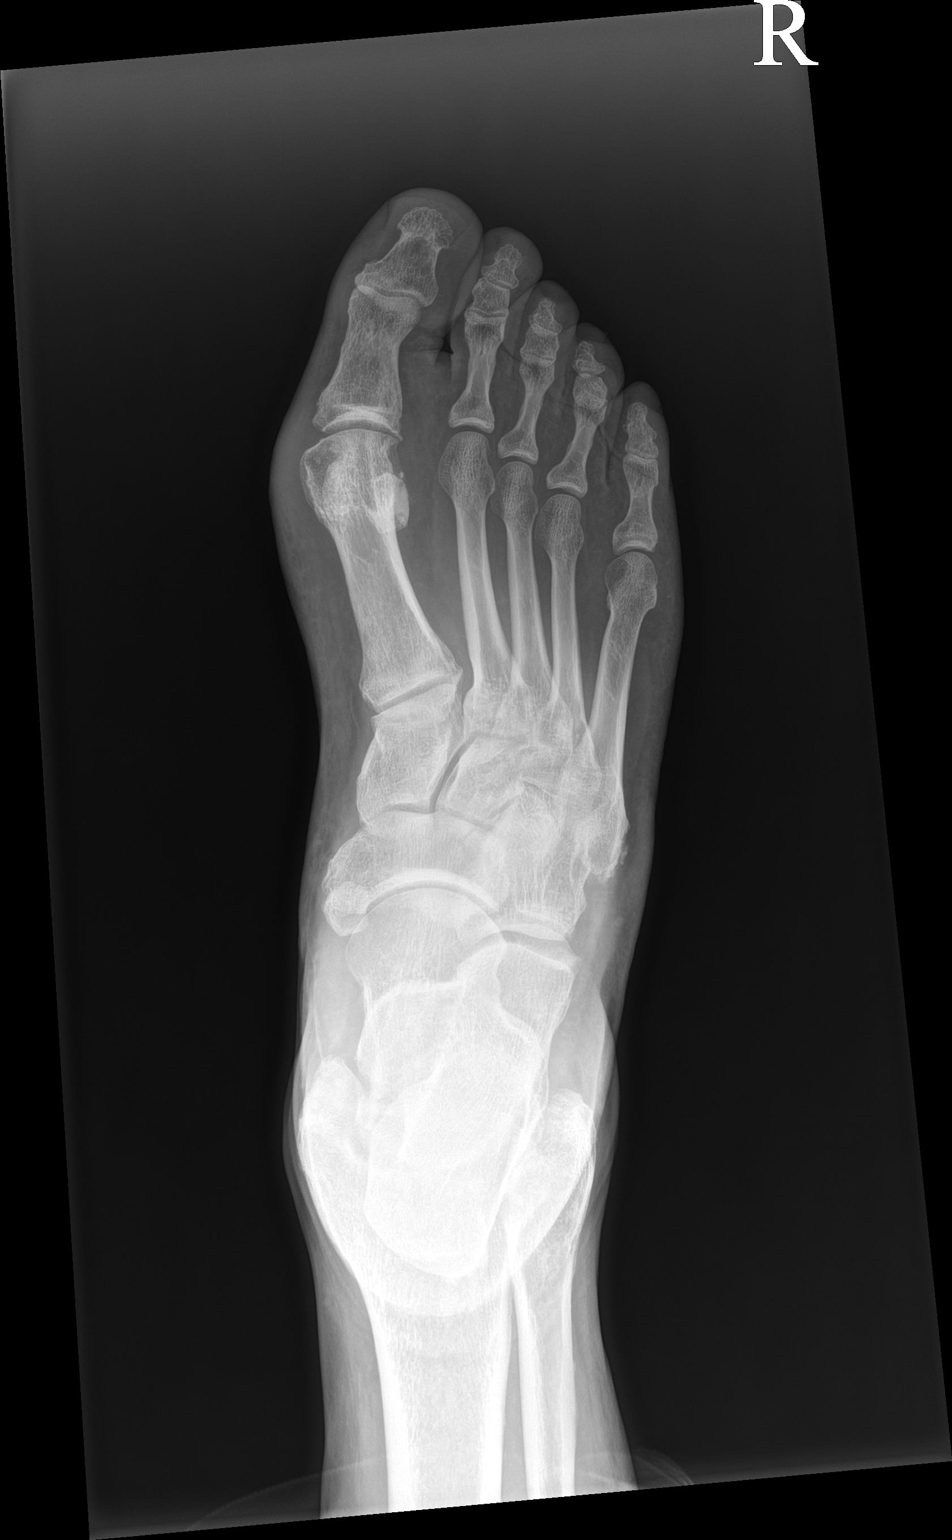

[foot obl]
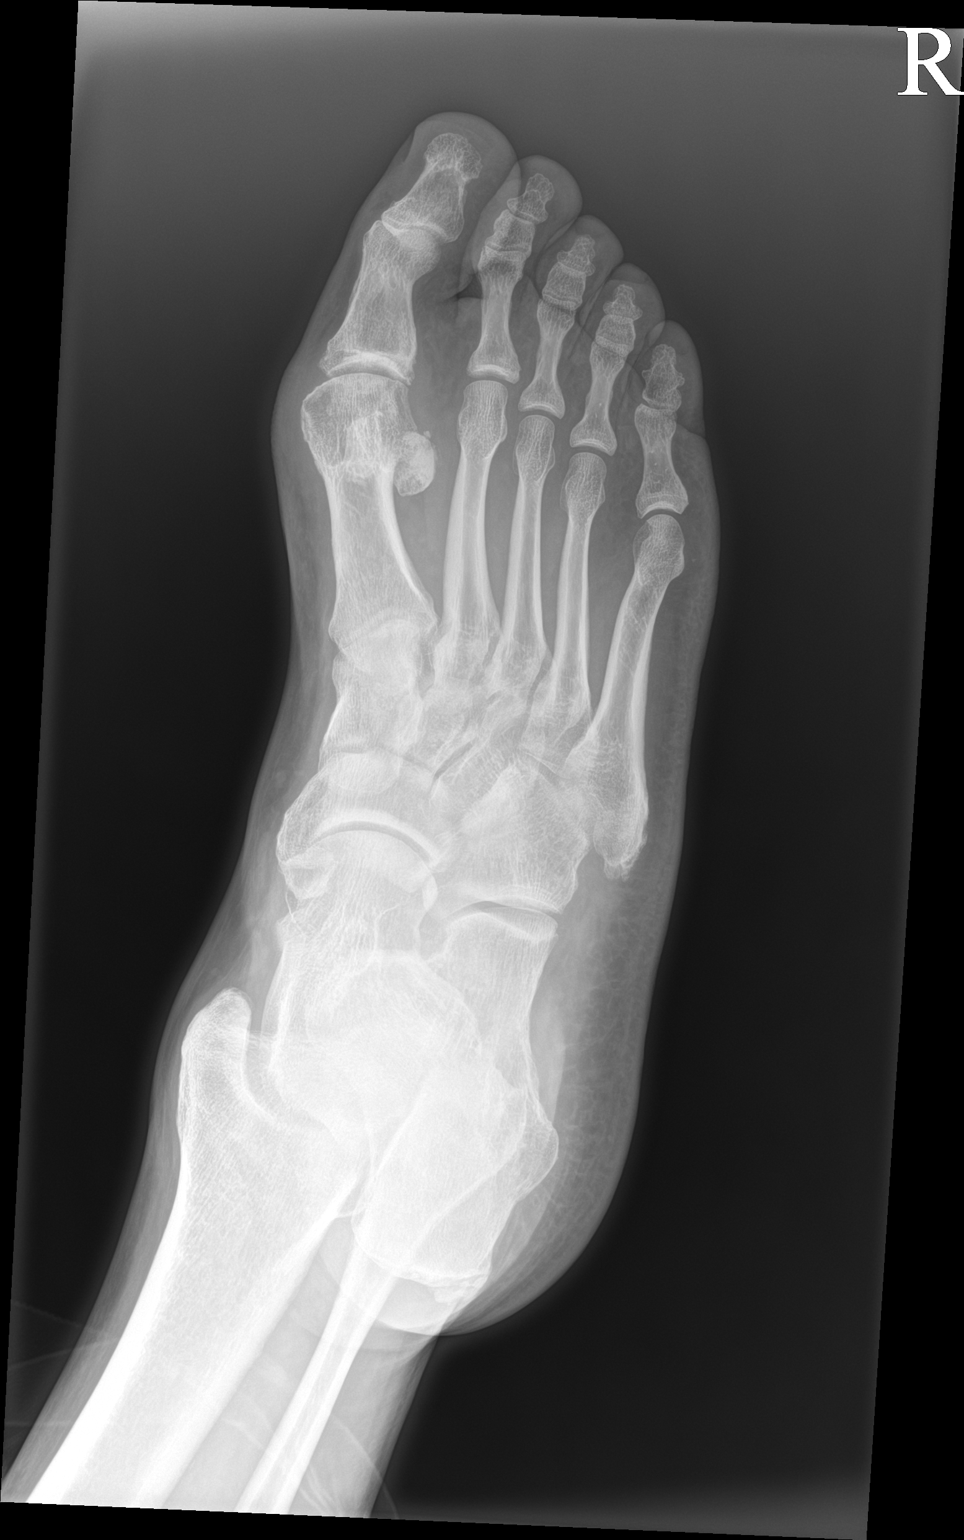

[foot lat]
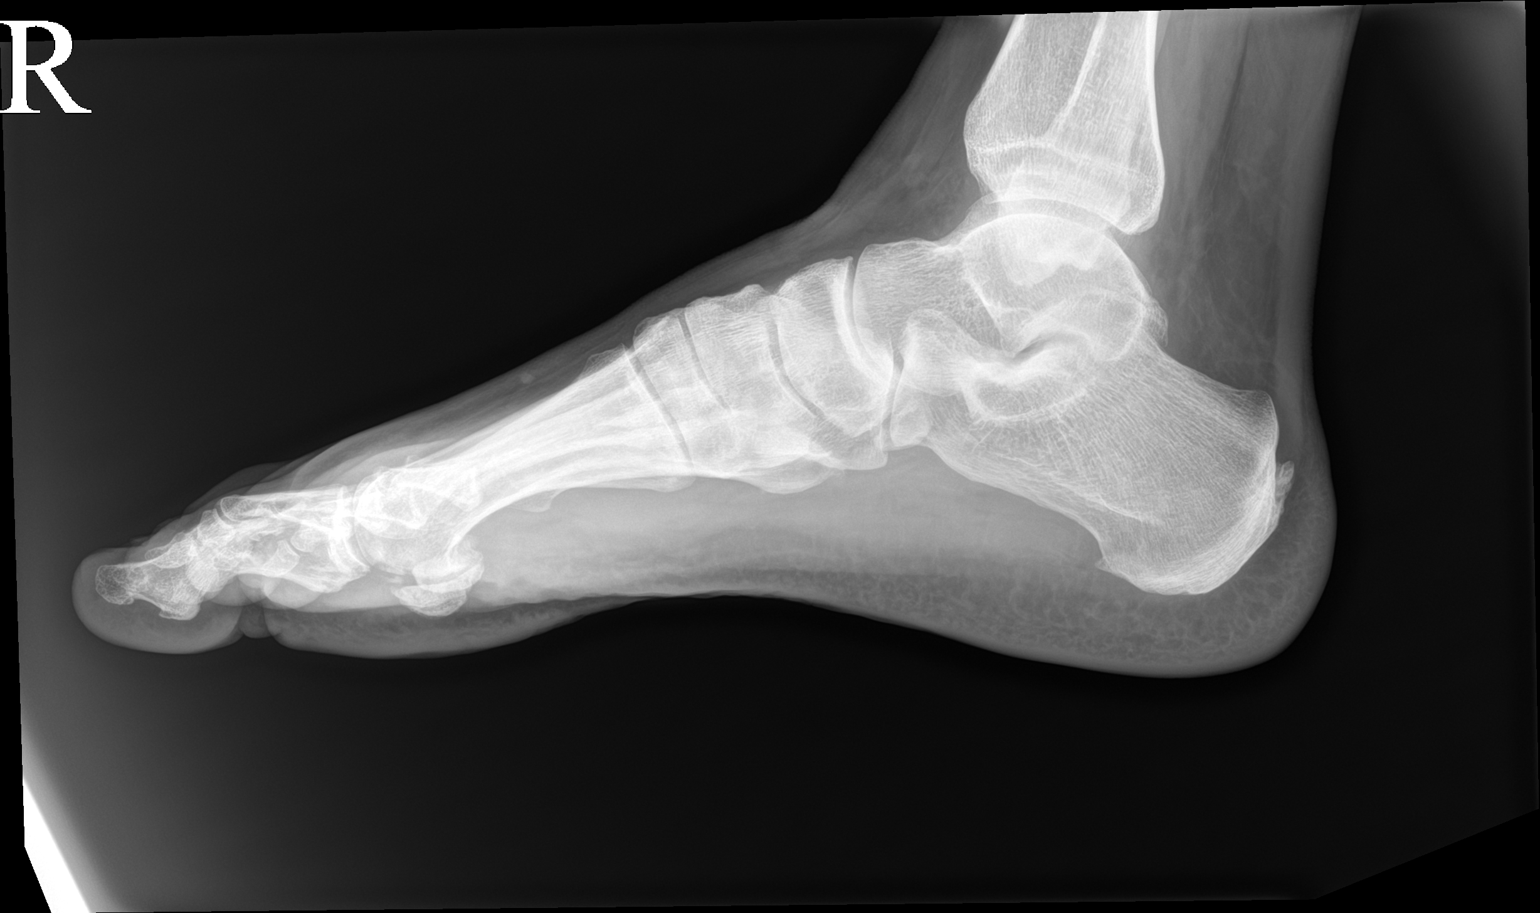

[3 of 3 positions shown; findings below may reference images not displayed]

FINDINGS: Right foot: Frontal, oblique, and lateral views are obtained. Mild
hallux valgus deformity and osteoarthritis of the first
metatarsophalangeal joint. Mild osteoarthritis also seen within the
tarsometatarsal joints. There is small superior and inferior
calcaneal spurs. No acute fracture. Soft tissues are unremarkable.

Left foot: Frontal, oblique, lateral views are obtained. Mild hallux
valgus deformity and moderate osteoarthritis of the first
metatarsophalangeal joint. Mild osteoarthritis of the
tarsometatarsal joints. There are small superior and inferior
calcaneal spurs. No acute fracture. Soft tissues are unremarkable.
IMPRESSION: 1. Bilateral hallux valgus deformity and osteoarthritis of the first
metatarsophalangeal joints, left greater than right.
2. Symmetrical mild osteoarthritis of the bilateral mid feet.
3. Small bilateral calcaneal spurs.
4. No acute bony abnormalities.

## 2021-01-21 IMAGING — DX DG FOOT COMPLETE 3+V*L*
3 series · 3 of 3 positions shown · non-contrast
Comparison: None.

CLINICAL DATA: Bilateral foot pain for 2 months, left greater than
right

EXAM:
LEFT FOOT - COMPLETE 3+ VIEW; RIGHT FOOT COMPLETE - 3+ VIEW

[foot ap]
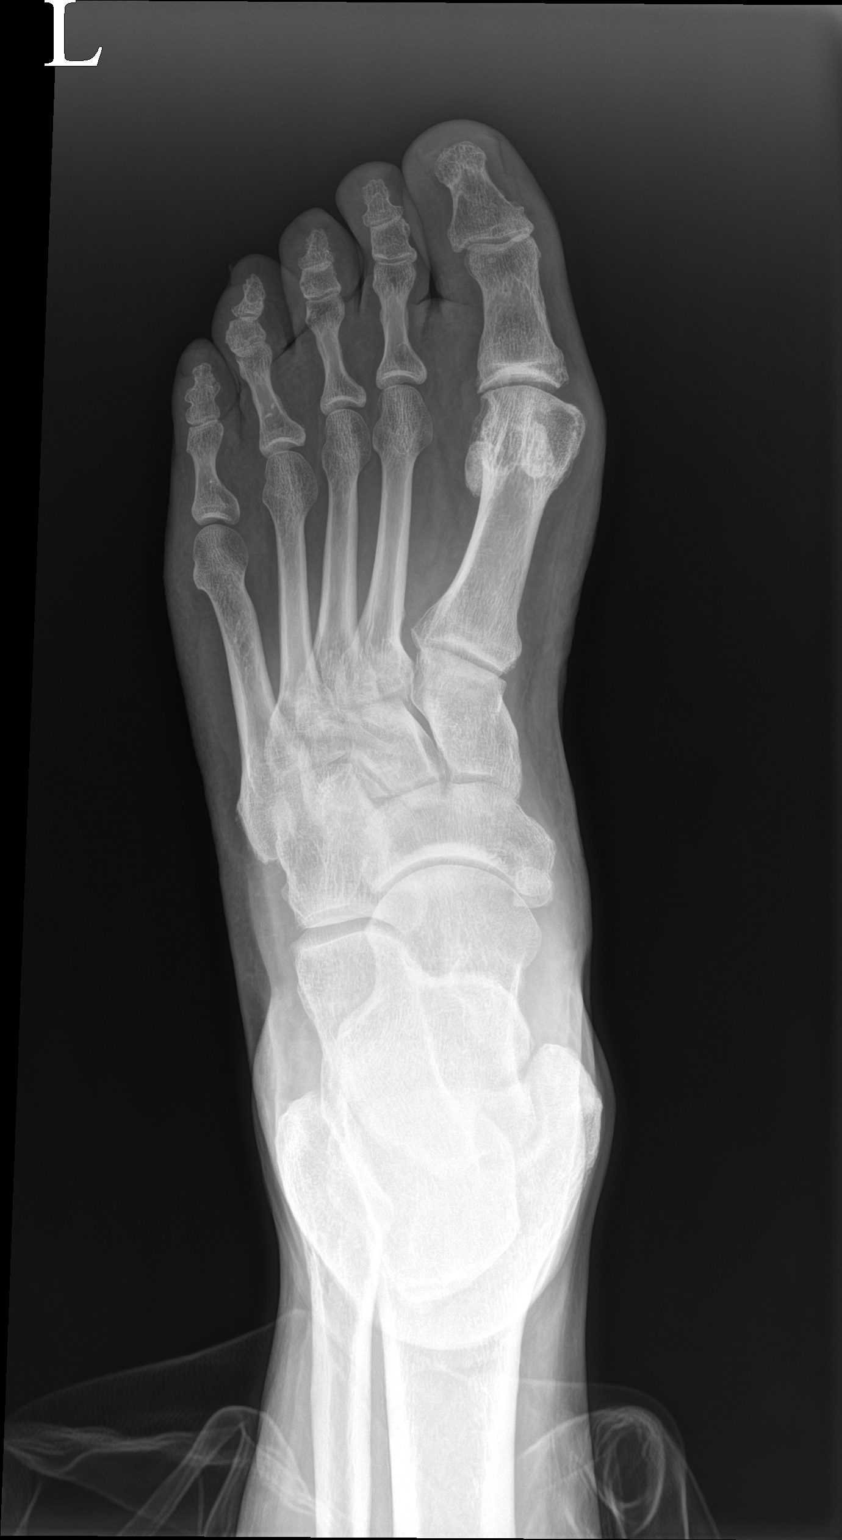

[foot obl]
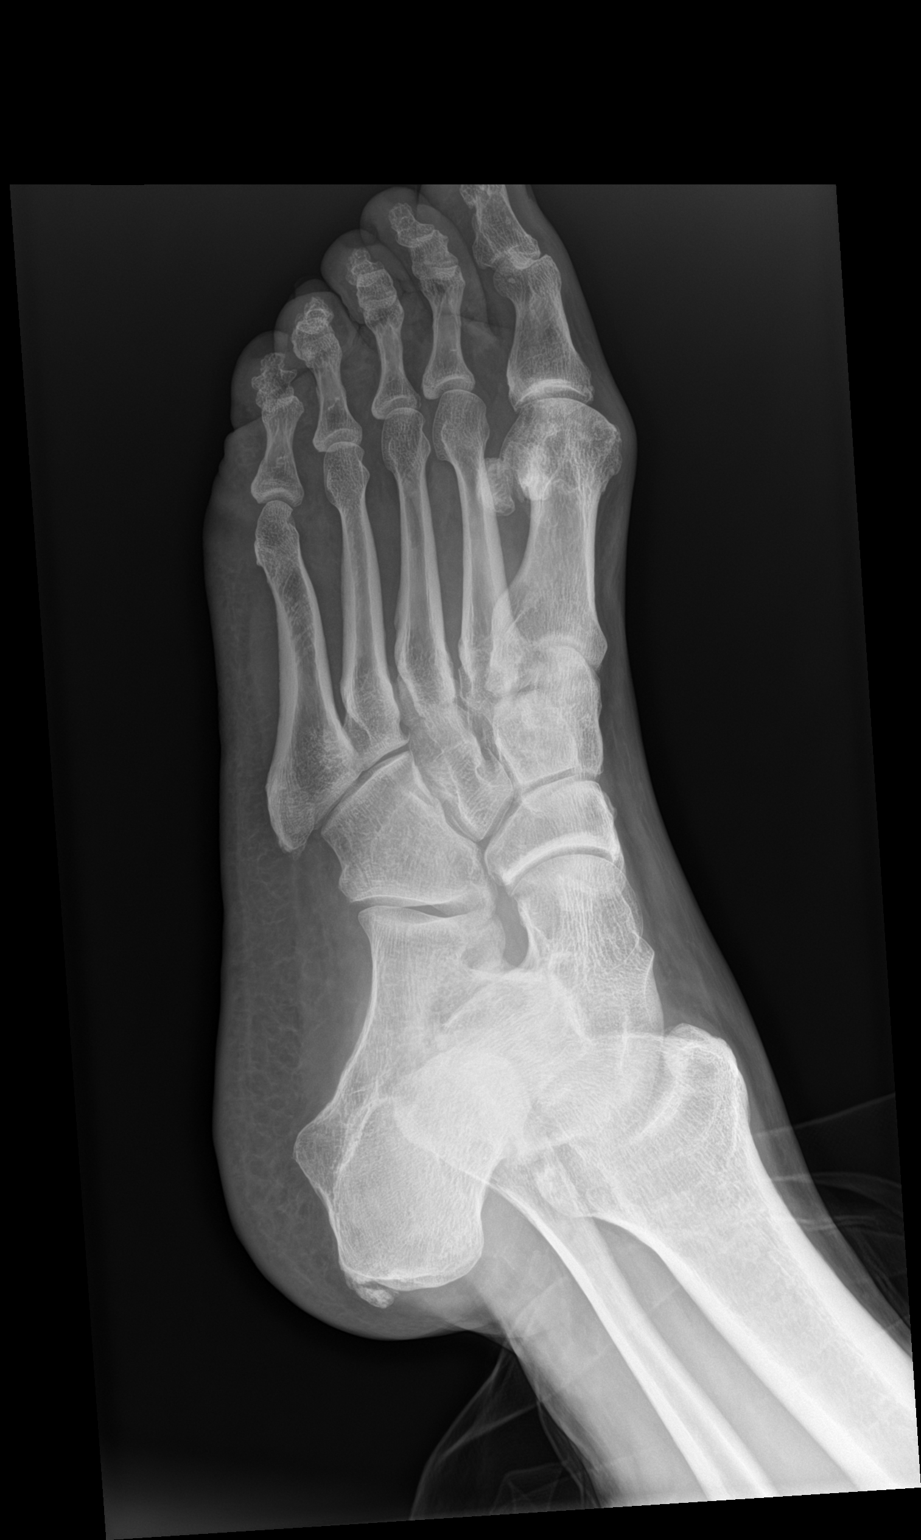

[foot lat]
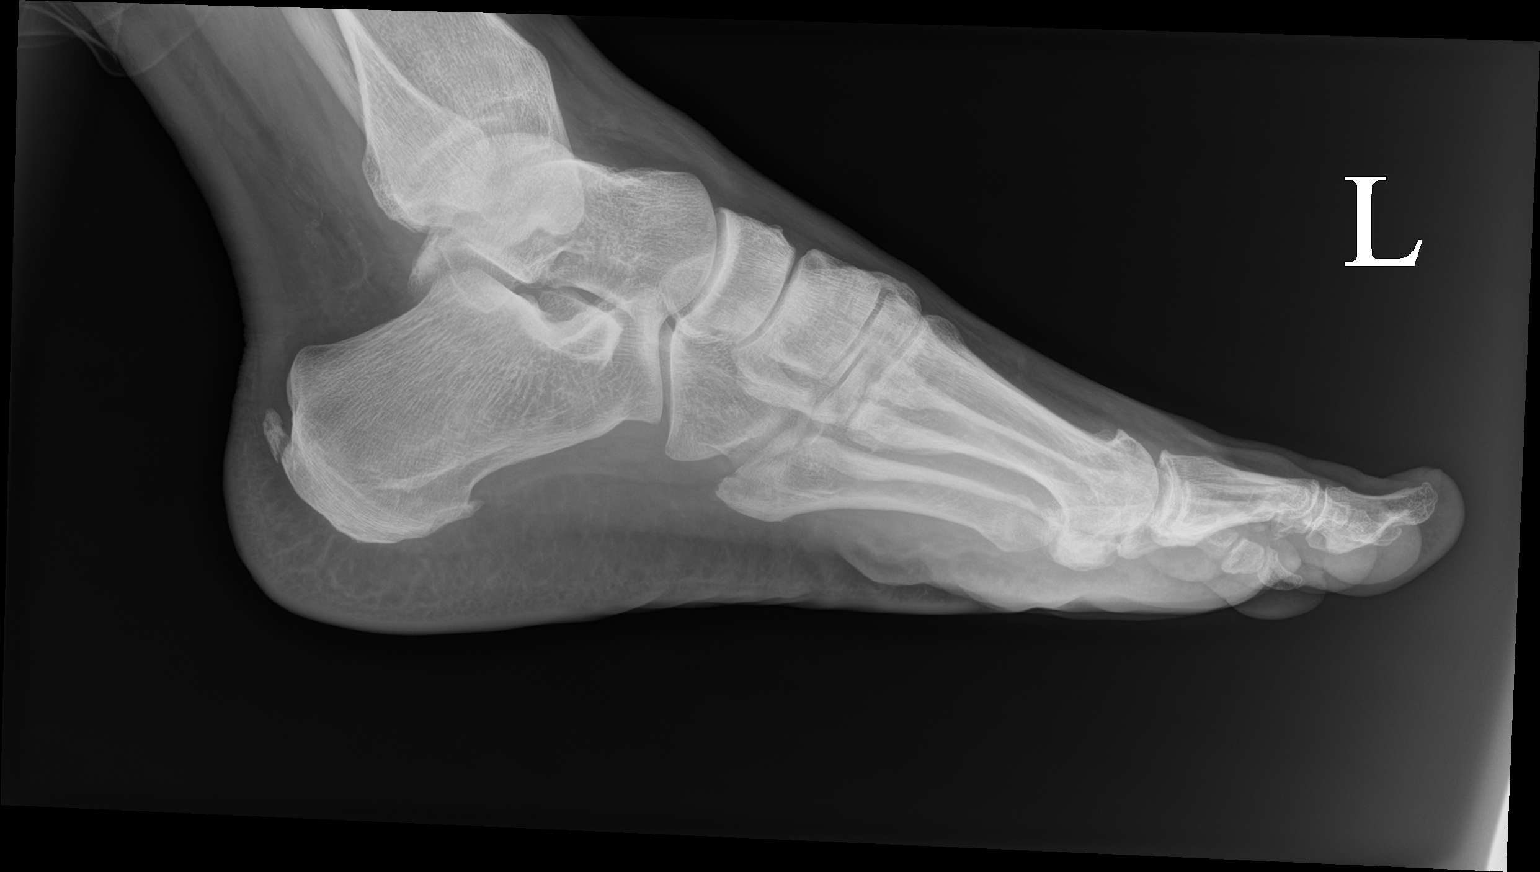

[3 of 3 positions shown; findings below may reference images not displayed]

FINDINGS: Right foot: Frontal, oblique, and lateral views are obtained. Mild
hallux valgus deformity and osteoarthritis of the first
metatarsophalangeal joint. Mild osteoarthritis also seen within the
tarsometatarsal joints. There is small superior and inferior
calcaneal spurs. No acute fracture. Soft tissues are unremarkable.

Left foot: Frontal, oblique, lateral views are obtained. Mild hallux
valgus deformity and moderate osteoarthritis of the first
metatarsophalangeal joint. Mild osteoarthritis of the
tarsometatarsal joints. There are small superior and inferior
calcaneal spurs. No acute fracture. Soft tissues are unremarkable.
IMPRESSION: 1. Bilateral hallux valgus deformity and osteoarthritis of the first
metatarsophalangeal joints, left greater than right.
2. Symmetrical mild osteoarthritis of the bilateral mid feet.
3. Small bilateral calcaneal spurs.
4. No acute bony abnormalities.

## 2021-01-23 ENCOUNTER — Ambulatory Visit (INDEPENDENT_AMBULATORY_CARE_PROVIDER_SITE_OTHER): Payer: Managed Care, Other (non HMO) | Admitting: Family Medicine

## 2021-01-23 ENCOUNTER — Encounter: Payer: Self-pay | Admitting: Family Medicine

## 2021-01-23 VITALS — BP 122/74 | HR 74 | Temp 97.7°F | Resp 16 | Ht 72.0 in | Wt 209.8 lb

## 2021-01-23 DIAGNOSIS — I10 Essential (primary) hypertension: Secondary | ICD-10-CM

## 2021-01-23 DIAGNOSIS — R739 Hyperglycemia, unspecified: Secondary | ICD-10-CM

## 2021-01-23 DIAGNOSIS — G250 Essential tremor: Secondary | ICD-10-CM

## 2021-01-23 DIAGNOSIS — Z0000 Encounter for general adult medical examination without abnormal findings: Secondary | ICD-10-CM | POA: Diagnosis not present

## 2021-01-23 DIAGNOSIS — Z79899 Other long term (current) drug therapy: Secondary | ICD-10-CM

## 2021-01-23 DIAGNOSIS — G4700 Insomnia, unspecified: Secondary | ICD-10-CM

## 2021-01-23 DIAGNOSIS — E782 Mixed hyperlipidemia: Secondary | ICD-10-CM

## 2021-01-23 DIAGNOSIS — D649 Anemia, unspecified: Secondary | ICD-10-CM

## 2021-01-23 MED ORDER — ZOLPIDEM TARTRATE 10 MG PO TABS: 5.0000 mg | ORAL_TABLET | Freq: Every evening | ORAL | 5 refills | Status: DC | PRN

## 2021-01-23 NOTE — Patient Instructions (Addendum)
Consider switching from metoprolol to propranolol for tremors discuss with cardiology  Preventive Care 60-60 Years Old, Male Preventive care refers to lifestyle choices and visits with your health care provider that can promote health and wellness. Preventive care visits are also called wellness exams. What can I expect for my preventive care visit? Counseling During your preventive care visit, your health care provider may ask about your: Medical history, including: Past medical problems. Family medical history. Current health, including: Emotional well-being. Home life and relationship well-being. Sexual activity. Lifestyle, including: Alcohol, nicotine or tobacco, and drug use. Access to firearms. Diet, exercise, and sleep habits. Safety issues such as seatbelt and bike helmet use. Sunscreen use. Work and work Statistician. Physical exam Your health care provider will check your: Height and weight. These may be used to calculate your BMI (body mass index). BMI is a measurement that tells if you are at a healthy weight. Waist circumference. This measures the distance around your waistline. This measurement also tells if you are at a healthy weight and may help predict your risk of certain diseases, such as type 2 diabetes and high blood pressure. Heart rate and blood pressure. Body temperature. Skin for abnormal spots. What immunizations do I need? Vaccines are usually given at various ages, according to a schedule. Your health care provider will recommend vaccines for you based on your age, medical history, and lifestyle or other factors, such as travel or where you work. What tests do I need? Screening Your health care provider may recommend screening tests for certain conditions. This may include: Lipid and cholesterol levels. Diabetes screening. This is done by checking your blood sugar (glucose) after you have not eaten for a while (fasting). Hepatitis B test. Hepatitis C  test. HIV (human immunodeficiency virus) test. STI (sexually transmitted infection) testing, if you are at risk. Lung cancer screening. Prostate cancer screening. Colorectal cancer screening. Talk with your health care provider about your test results, treatment options, and if necessary, the need for more tests. Follow these instructions at home: Eating and drinking  Eat a diet that includes fresh fruits and vegetables, whole grains, lean protein, and low-fat dairy products. Take vitamin and mineral supplements as recommended by your health care provider. Do not drink alcohol if your health care provider tells you not to drink. If you drink alcohol: Limit how much you have to 0-2 drinks a day. Know how much alcohol is in your drink. In the [place] one drink equals one 12 oz bottle of beer (355 mL), one 5 oz glass of wine (148 mL), or one 1 oz glass of hard liquor (44 mL). Lifestyle Brush your teeth every morning and night with fluoride toothpaste. Floss one time each day. Exercise for at least 30 minutes 5 or more days each week. Do not use any products that contain nicotine or tobacco. These products include cigarettes, chewing tobacco, and vaping devices, such as e-cigarettes. If you need help quitting, ask your health care provider. Do not use drugs. If you are sexually active, practice safe sex. Use a condom or other form of protection to prevent STIs. Take aspirin only as told by your health care provider. Make sure that you understand how much to take and what form to take. Work with your health care provider to find out whether it is safe and beneficial for you to take aspirin daily. Find healthy ways to manage stress, such as: Meditation, yoga, or listening to music. Journaling. Talking to a trusted person. Spending time  with friends and family. Minimize exposure to UV radiation to reduce your risk of skin cancer. Safety Always wear your seat belt while driving or riding in a  vehicle. Do not drive: If you have been drinking alcohol. Do not ride with someone who has been drinking. When you are tired or distracted. While texting. If you have been using any mind-altering substances or drugs. Wear a helmet and other protective equipment during sports activities. If you have firearms in your house, make sure you follow all gun safety procedures. What's next? Go to your health care provider once a year for an annual wellness visit. Ask your health care provider how often you should have your eyes and teeth checked. Stay up to date on all vaccines. This information is not intended to replace advice given to you by your health care provider. Make sure you discuss any questions you have with your health care provider. Document Revised: 07/17/2020 Document Reviewed: 07/17/2020 Elsevier Patient Education  [place].

## 2021-01-23 NOTE — Assessment & Plan Note (Signed)
Tolerating statin, encouraged heart healthy diet, avoid trans fats, minimize simple carbs and saturated fats. Increase exercise as tolerated tolerating Atorvastatin 

## 2021-01-23 NOTE — Assessment & Plan Note (Signed)
Encouraged good sleep hygiene such as dark, quiet room. No blue/green glowing lights such as computer screens in bedroom. No alcohol or stimulants in evening. Cut down on caffeine as able. Regular exercise is helpful but not just prior to bed time. May continue using Ambien prn

## 2021-01-23 NOTE — Assessment & Plan Note (Signed)
Well controlled, no changes to meds. Encouraged heart healthy diet such as the DASH diet and exercise as tolerated.  °

## 2021-01-23 NOTE — Progress Notes (Signed)
Patient ID: Dakota [name], male    DOB: November 19, 1960  Age: 60 y.o. MRN: [id number]    Subjective:   Chief Complaint  Patient presents with   Annual Exam   Subjective  HPI Dakota [name] presents for office visit today for comprehensive physical exam today and follow up on management of chronic concerns. 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  Foot problems: He reports that last week he was barely able to walk due to his right foot pain and left achilles' heel pain. He took leftover Methylprednisolone which alleviated his pain. He is scheduled for an MRI of bilateral feet. Denies CP/palp/SOB/HA/congestion/fevers/GI or GU c/o. Taking meds as prescribed.  Tremors: changed BP meds, but now feel uncontrollable especially when stressed or feeling anxiety. Tremors been always there for years but recently he notes that they have been worsening.  FMHx: Father and son have tremors as well, Paternal aunt diseased in her 35's. She had leukemia.  He has psoriasis which he seems dermatology for tx.   Review of Systems  Constitutional:  Negative for chills, fatigue and fever.  HENT:  Positive for hearing loss and tinnitus (L > R). Negative for congestion, rhinorrhea, sinus pressure, sinus pain, sore throat and trouble swallowing.   Eyes:  Negative for pain.  Respiratory:  Negative for cough and shortness of breath.   Cardiovascular:  Negative for chest pain, palpitations and leg swelling.  Gastrointestinal:  Negative for abdominal pain, blood in stool, diarrhea, nausea and vomiting.  Genitourinary:  Negative for flank pain, frequency and penile pain.  Musculoskeletal:  Negative for back pain.  Neurological:  Positive for tremors. Negative for headaches.   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   His family history includes Alcohol abuse in his father and sister; COPD in his sister; Cancer in his father and paternal aunt; Esophageal cancer in his father; Heart disease in his maternal grandfather; Multiple sclerosis in his mother; Seizures in his sister; Tremor in his father and son.He reports that he quit smoking about 5 years ago. His smoking use included cigarettes. He has a 10.00 pack-year smoking history. He has never used smokeless tobacco. He reports current alcohol use. He reports that he does not use drugs.  Current Outpatient Medications on File Prior to Visit  Medication Sig Dispense Refill   allopurinol (ZYLOPRIM) 100 MG tablet Take 1 tablet (100 mg total) by mouth daily. 90 tablet 1   aspirin EC 81 MG tablet Take 1 tablet (81 mg total) by mouth daily. Swallow whole. 90 tablet 3   atorvastatin (LIPITOR) 80 MG tablet  TAKE 1 TABLET DAILY AT 6 P.M. (SCHEDULE AN APPOINTMENT FOR FUTURE REFILLS) 60 tablet 5   buPROPion (WELLBUTRIN XL) 300 MG 24 hr tablet TAKE 1 TABLET BY MOUTH DAILY (Patient taking differently: Take 300 mg by mouth daily.) 90 tablet 3   cetirizine (ZYRTEC) 10 MG tablet Take 10 mg by mouth daily as needed for allergies.     clobetasol ointment (TEMOVATE) 1.61 % Apply 1 application topically 2 (two) times daily as needed (psoriasis). 60 g 2    Coenzyme Q10 300 MG CAPS Take 300 mg by mouth every other day.     ezetimibe (ZETIA) 10 MG tablet Take 1 tablet (10 mg total) by mouth daily. 90 tablet 3   indomethacin (INDOCIN) 50 MG capsule Take 1 capsule (50 mg total) by mouth 2 (two) times daily as needed. 180 capsule 0   lansoprazole (PREVACID) 15 MG capsule Take 15 mg by mouth daily as needed (acid reflux).     Melatonin 10 MG CAPS Take 10 mg by mouth at bedtime as needed (sleep).     metoprolol succinate (TOPROL-XL) 50 MG 24 hr tablet TAKE 1 TABLET DAILY TAKE WITH OR IMMEDIATELY FOLLOWING A MEAL 90 tablet 3   Multiple Vitamin (MULTIVITAMIN WITH MINERALS) TABS tablet Take 1 tablet by mouth daily.     mupirocin ointment (BACTROBAN) 2 % Place 1 application into the nose 2 (two) times daily. (Patient taking differently: Place 1 application into the nose 2 (two) times daily as needed (wound care).) 30 g 1   rOPINIRole (REQUIP) 0.5 MG tablet Take 1-2 tablets (0.5-1 mg total) by mouth at bedtime. 60 tablet 2   tiZANidine (ZANAFLEX) 2 MG tablet Take 0.5-2 tablets (1-4 mg total) by mouth every 8 (eight) hours as needed for muscle spasms. 40 tablet 2   losartan (COZAAR) 50 MG tablet Take 1 tablet (50 mg total) by mouth daily. 90 tablet 3   No current facility-administered medications on file prior to visit.     Objective:  Objective  Physical Exam Constitutional:      General: He is not in acute distress.    Appearance: Normal appearance. He is not ill-appearing or toxic-appearing.  HENT:     Head: Normocephalic and atraumatic.     Right Ear: Tympanic membrane, ear canal and external ear normal.     Left Ear: Tympanic membrane, ear canal and external ear normal.     Nose: No congestion or rhinorrhea.  Eyes:     Extraocular Movements: Extraocular movements intact.     Right eye: No nystagmus.     Left eye: No nystagmus.     Pupils: Pupils are equal, round, and reactive to light.  Cardiovascular:     Rate and Rhythm: Regular rhythm.      Pulses: Normal pulses.          Posterior tibial pulses are 2+ on the right side and 2+ on the left side.     Heart sounds: Normal heart sounds. No murmur heard. Pulmonary:     Effort: Pulmonary effort is normal. No respiratory distress.     Breath sounds: Normal breath sounds. No wheezing, rhonchi or rales.  Abdominal:     General: Bowel sounds are normal.     Palpations: Abdomen is soft. There is no mass.     Tenderness: There is no abdominal tenderness. There is no guarding.     Hernia: No hernia is present.  Musculoskeletal:        General: Normal range of  motion.     Cervical back: Normal range of motion and neck supple.  Skin:    General: Skin is warm and dry.  Neurological:     Mental Status: He is alert and oriented to person, place, and time.     Cranial Nerves: No facial asymmetry.     Motor: Motor function is intact. No weakness.     Deep Tendon Reflexes:     Reflex Scores:      Patellar reflexes are 2+ on the right side and 2+ on the left side. Psychiatric:        Behavior: Behavior normal.   BP 122/74    Pulse 74    Temp 97.7 F (36.5 C)    Resp 16    Ht 6' (1.829 m)    Wt 209 lb 12.8 oz (95.2 kg)    SpO2 95%    BMI 28.45 kg/m  Wt Readings from Last 3 Encounters:  01/23/21 209 lb 12.8 oz (95.2 kg)  12/23/20 212 lb (96.2 kg)  04/17/20 212 lb (96.2 kg)     Lab Results  Component Value Date   WBC 7.2 01/21/2021   HGB 13.1 01/21/2021   HCT 41.4 01/21/2021   PLT 281.0 01/21/2021   GLUCOSE 90 01/21/2021   CHOL 131 01/21/2021   TRIG 193.0 (H) 01/21/2021   HDL 38.40 (L) 01/21/2021   LDLDIRECT 149.4 09/13/2007   LDLCALC 54 01/21/2021   ALT 22 01/21/2021   AST 17 01/21/2021   NA 139 01/21/2021   K 4.6 01/21/2021   CL 101 01/21/2021   CREATININE 1.08 01/21/2021   BUN 17 01/21/2021   CO2 31 01/21/2021   TSH 2.94 01/21/2021   PSA 0.40 06/10/2020   HGBA1C 6.1 01/21/2021    DG Cervical Spine Complete  Result Date: 03/09/2020 CLINICAL DATA:  Neck pain  radiating into both shoulders, history of disc disease EXAM: CERVICAL SPINE - COMPLETE 4+ VIEW COMPARISON:  None. FINDINGS: Frontal, bilateral oblique, and lateral views of the cervical spine are obtained. There is reversal of cervical lordosis, with mild kyphosis centered at the C4 level. Otherwise alignment is anatomic. There is moderate spondylosis from C3 through C7, with significant disc space narrowing and anterior osteophyte formation. There is mild symmetrical neural foraminal narrowing from C3-4 through C6-7. Prevertebral soft tissues are unremarkable. Lung apices are clear. IMPRESSION: 1. Multilevel spondylosis from C3 through C7, with symmetrical neural foraminal encroachment. Electronically Signed   By: Randa Ngo M.D.   On: 03/09/2020 15:07     Assessment & Plan:  Plan    Meds ordered this encounter  Medications   zolpidem (AMBIEN) 10 MG tablet    Sig: Take 0.5-1 tablets (5-10 mg total) by mouth at bedtime as needed. for sleep    Dispense:  30 tablet    Refill:  5    Problem List Items Addressed This Visit     Hyperlipidemia, mixed    Tolerating statin, encouraged heart healthy diet, avoid trans fats, minimize simple carbs and saturated fats. Increase exercise as tolerated tolerating Atorvastatin      Relevant Orders   CBC with Differential/Platelet   Comprehensive metabolic panel   Lipid panel   TSH   Essential hypertension    Well controlled, no changes to meds. Encouraged heart healthy diet such as the DASH diet and exercise as tolerated.       Relevant Orders   CBC with Differential/Platelet   Comprehensive metabolic panel   Lipid panel   TSH  Insomnia    Encouraged good sleep hygiene such as dark, quiet room. No blue/green glowing lights such as computer screens in bedroom. No alcohol or stimulants in evening. Cut down on caffeine as able. Regular exercise is helpful but not just prior to bed time. May continue using Ambien prn      Preventative health  care    Patient encouraged to maintain heart healthy diet, regular exercise, adequate sleep. Consider daily probiotics. Take medications as prescribed. Labs ordered and reviewed. last colonoscopy in 2021 repeat in 2024      Hyperglycemia - Primary   Relevant Orders   Hemoglobin A1c   Benign head tremor    Notes some tremor in hands as well and is slowly progressing there is a family history so likely a benign tremor. Could consider switching from Metoprolol to Propranolol to see if that helps      Other Visit Diagnoses     Anemia, unspecified type       Relevant Orders   CBC with Differential/Platelet   Comprehensive metabolic panel   High risk medication use       Relevant Orders   Drug Monitoring Panel [phone] , Urine   Long-term current use of benzodiazepine       Relevant Orders   Drug Monitoring Panel [phone] , Urine       Follow-up: Return in about 6 months (around 07/24/2021) for lab appointment before f/u visit.  I, Suezanne Jacquet, acting as a scribe for Penni Homans, MD, have documented all relevent documentation on behalf of Penni Homans, MD, as directed by Penni Homans, MD while in the presence of Penni Homans, MD. DO:01/23/21.  I, [name], MD personally performed the services described in this documentation. All medical record entries made by the scribe were at my direction and in my presence. I have reviewed the chart and agree that the record reflects my personal performance and is accurate and complete

## 2021-01-23 NOTE — Assessment & Plan Note (Signed)
Notes some tremor in hands as well and is slowly progressing there is a family history so likely a benign tremor. Could consider switching from Metoprolol to Propranolol to see if that helps

## 2021-01-23 NOTE — Assessment & Plan Note (Addendum)
Patient encouraged to maintain heart healthy diet, regular exercise, adequate sleep. Consider daily probiotics. Take medications as prescribed. Labs ordered and reviewed. last colonoscopy in 2021 repeat in 2024

## 2021-01-30 ENCOUNTER — Ambulatory Visit
Admission: RE | Admit: 2021-01-30 | Discharge: 2021-01-30 | Disposition: A | Discharge status: Home / Self Care | Payer: Managed Care, Other (non HMO) | Source: Ambulatory Visit | Attending: Family Medicine | Admitting: Family Medicine

## 2021-01-30 ENCOUNTER — Other Ambulatory Visit: Payer: Self-pay

## 2021-01-30 DIAGNOSIS — M79671 Pain in right foot: Secondary | ICD-10-CM

## 2021-01-30 DIAGNOSIS — M79672 Pain in left foot: Secondary | ICD-10-CM

## 2021-01-30 IMAGING — MR MR FOOT*R* W/O CM
4 of 6 series · 14 of 40 positions shown · non-contrast
Comparison: X-ray foot [DATE].

CLINICAL DATA: Chronic right hindfoot/lateral midfoot pain for 2
months. Patient reports injured right foot/ankle 2 months ago
related to stepped in a hole in yard. Clinical concern for
osteoarthritis.

EXAM:
MRI OF THE RIGHT FOREFOOT WITHOUT CONTRAST
TECHNIQUE: Multiplanar, multisequence MR imaging of the right forefoot was
performed. No intravenous contrast was administered.

[Series 3: PD fat-sat · axial · left · 3.0mm · 0.28mm/px · z∈[-100,+36]mm · 5 of 35 slices shown]
[im 1/35]
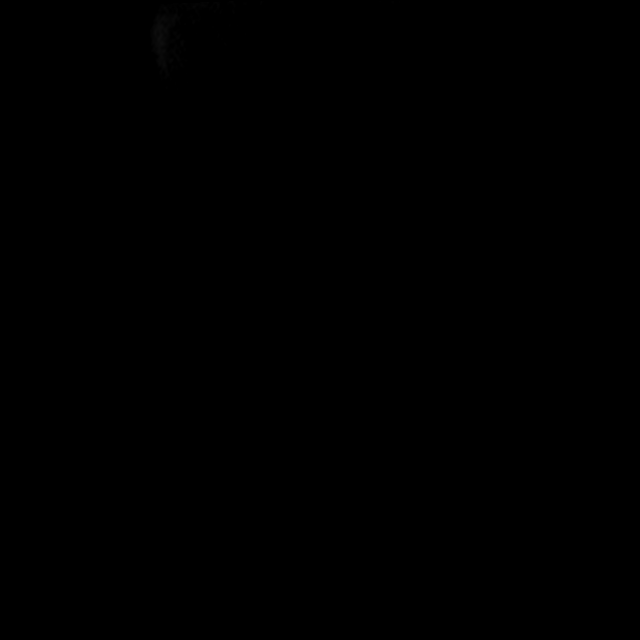
[im 7/35]
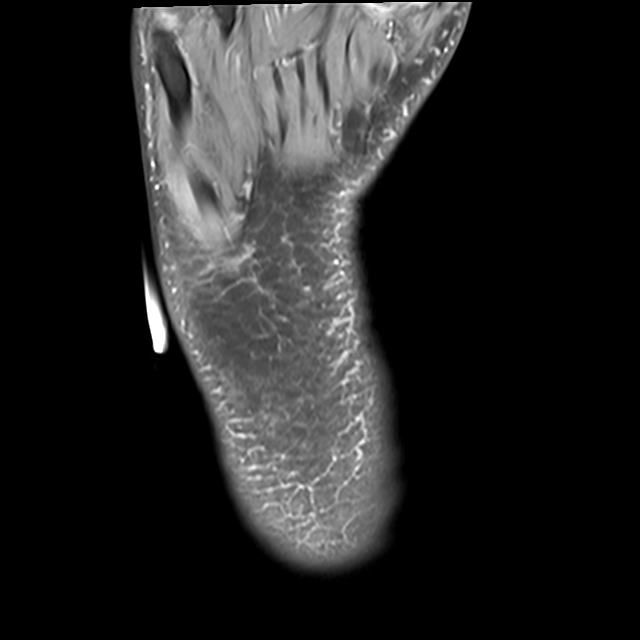
[im 14/35]
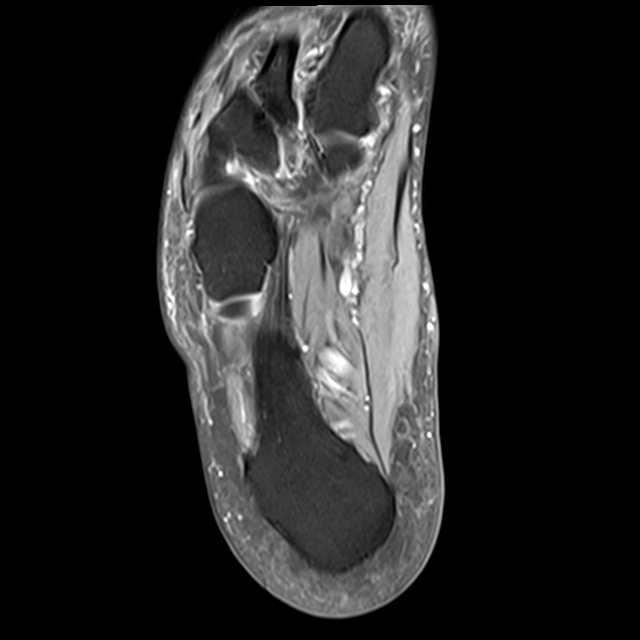
[im 21/35]
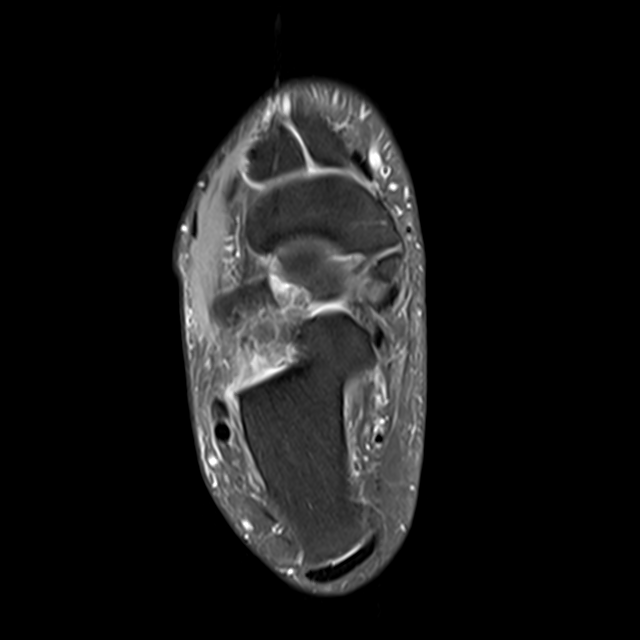
[im 35/35]
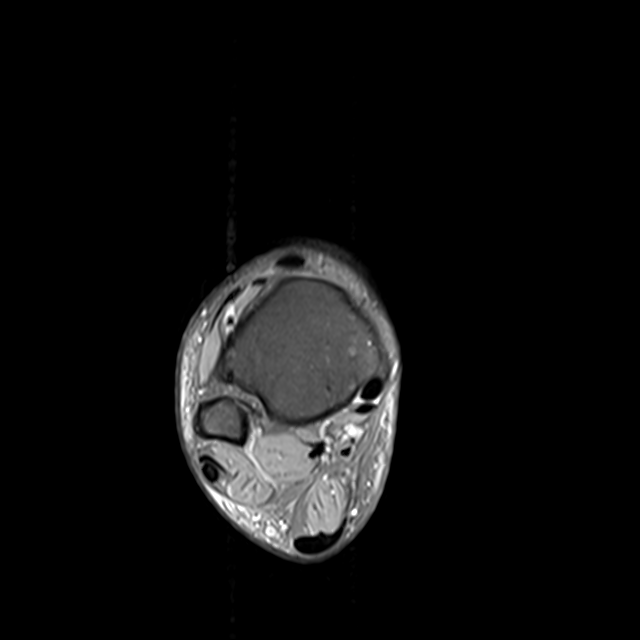

[Series 4: T2 fat-sat · axial · left · 3.0mm · 0.28mm/px · z∈[-80,+12]mm · 3 of 35 slices shown (1 of 2)]
[im 6/35]
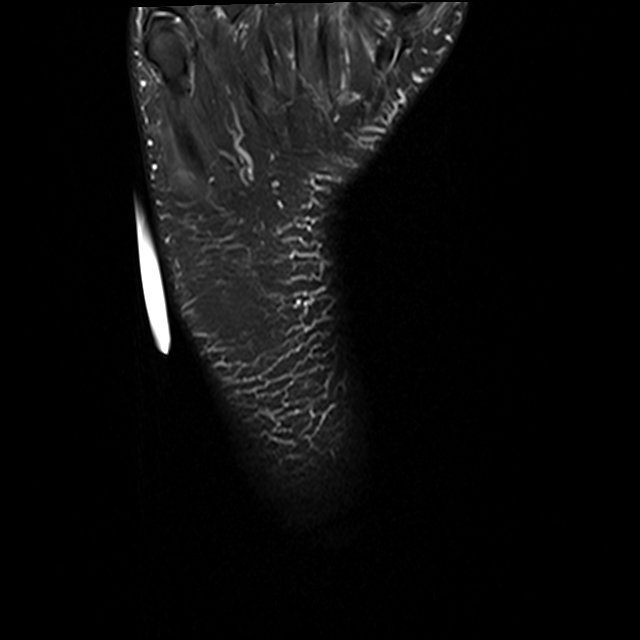
[im 18/35]
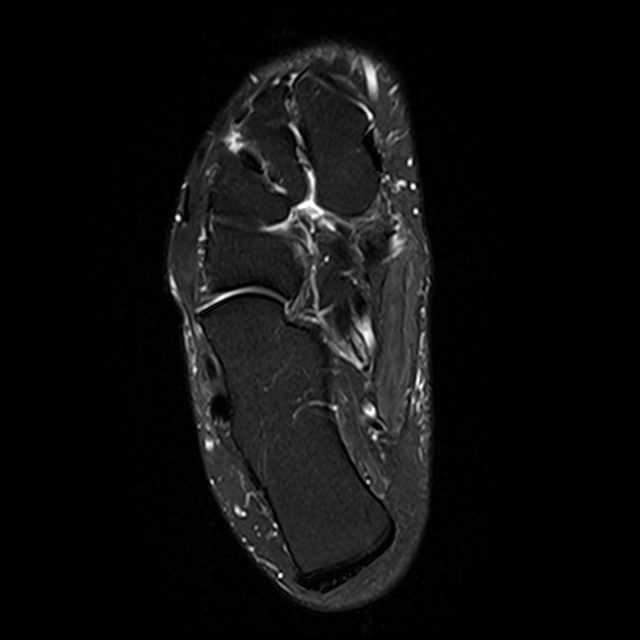
[im 29/35]
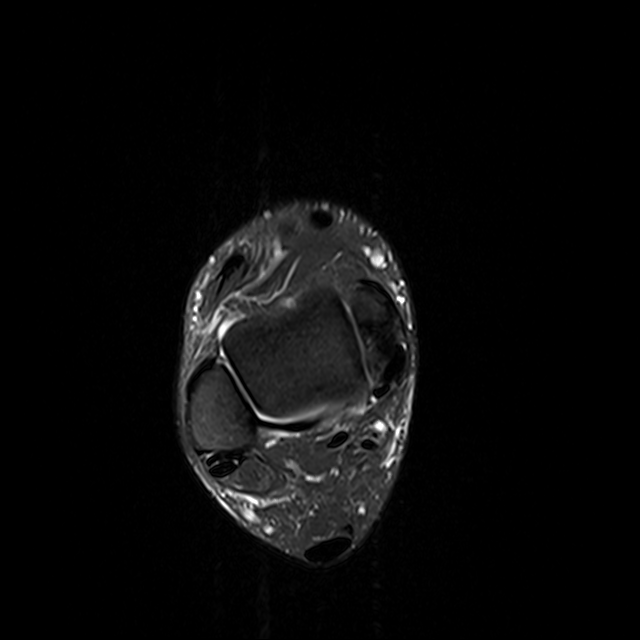

[Series 7: T1 · sagittal · left · 4.0mm · 0.35mm/px · 3 of 24 slices shown]
[im 1/24]
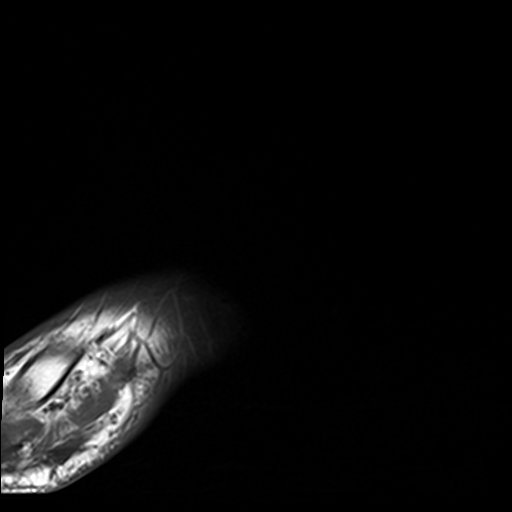
[im 12/24]
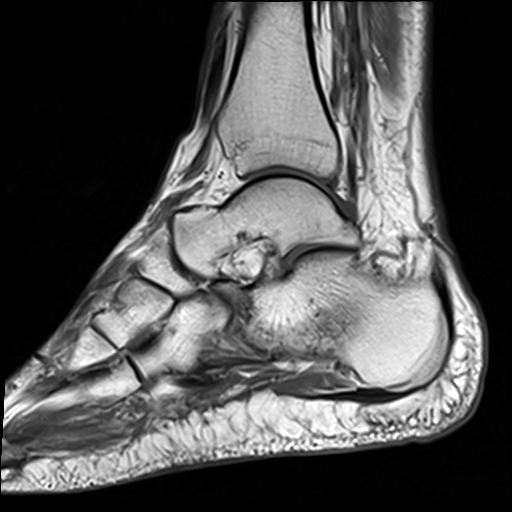
[im 24/24]
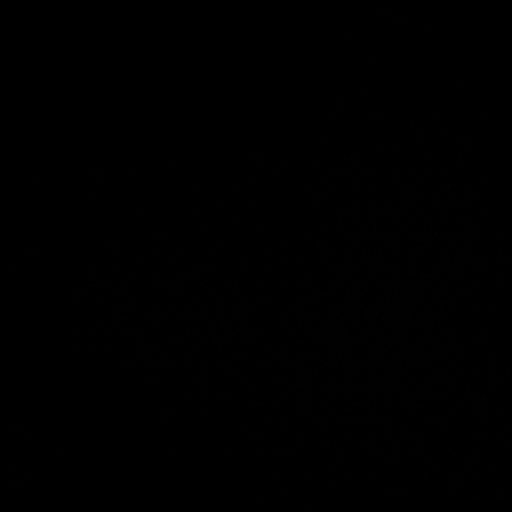

[Series 8: T2 fat-sat · coronal · left · 3.0mm · 0.25mm/px · 3 of 48 slices shown (2 of 2)]
[im 6/48]
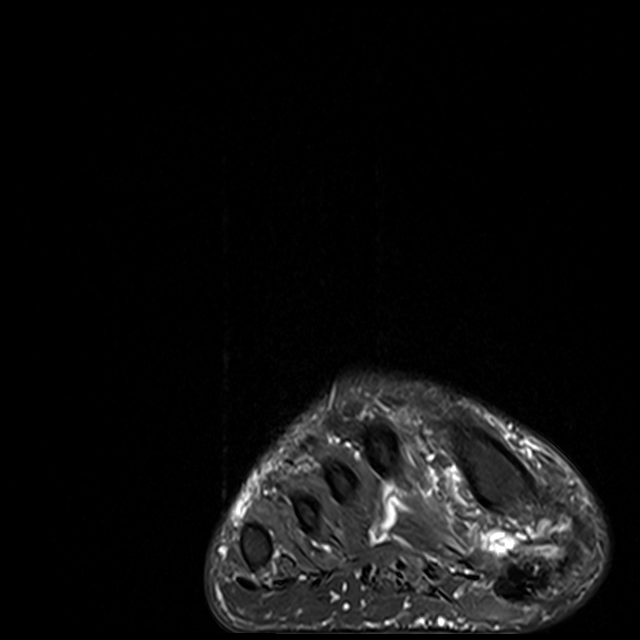
[im 24/48]
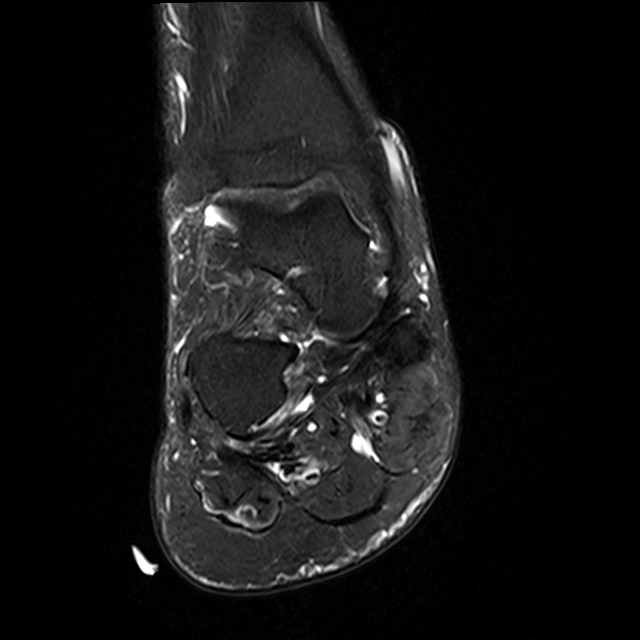
[im 42/48]
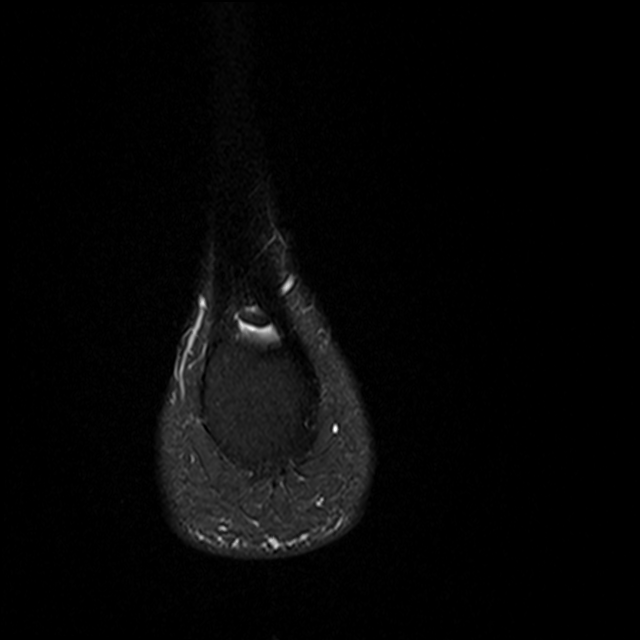

[14 of 40 positions shown; findings below may reference images not displayed]

FINDINGS: TENDONS

Peroneal: Mild tendinosis of the peroneus longus. Mild tendinosis of
the peroneus brevis with a longitudinal split tear of the level of
the lateral malleolus.

Posteromedial: Posterior tibial tendon intact. Flexor hallucis
longus tendon intact. Flexor digitorum longus tendon intact.

Anterior: Tibialis anterior tendon intact. Extensor hallucis longus
tendon intact Extensor digitorum longus tendon intact.

Achilles:  Intact.

Plantar Fascia: Intact.

LIGAMENTS

Lateral: Anterior talofibular ligament intact. Calcaneofibular
ligament intact. Posterior talofibular ligament intact. Anterior and
posterior tibiofibular ligaments intact.

Medial: Deltoid ligament intact. Spring ligament intact.

CARTILAGE

Ankle Joint: No joint effusion. Normal ankle mortise. No chondral
defect.

Subtalar Joints/Sinus Tarsi: Normal subtalar joints. No subtalar
joint effusion. Normal sinus tarsi.

Bones: No marrow signal abnormality.  No fracture or dislocation.

Soft Tissue: No fluid collection or hematoma. Muscles are normal
without edema or atrophy. Tarsal tunnel is normal.
IMPRESSION: 1. Mild tendinosis of the peroneus longus.
2. Mild tendinosis of the peroneus brevis with a longitudinal split
tear of the level of the lateral malleolus.
3. No significant arthropathy of right ankle.

## 2021-01-30 NOTE — Progress Notes (Signed)
I, Dakota [name], LAT, ATC, am serving as scribe for Dr. Lynne Leader.  Dakota [name] is a 60 y.o. male who presents to [place] at [facility] today for f/u of B foot pain thought to be due to a possible stress reaction and to review his B foot MRI.  He was last seen by Dr. Tamala [name] on 12/23/20 and was advised to wear rigid soled shoes, use Voltaren gel and to complete a HEP per ATC.  He was thought to perhaps have a stress fracture or stress reaction especially in the right foot.  He later messaged Dr. Tamala [name] and B foot MRIs were ordered.  Today, pt reports both feet are very painful, R>L. Pt notes he was told the tear in his R foot should heal within 6 weeks or so. Pt notes some relief when taking indomethacin.   Diagnostic testing: R and L foot MRI- 01/30/21; R and L foot XR- 01/21/21  Pertinent review of systems: No fevers or chills  Relevant historical information: Hypertension.   Exam:  BP 130/86    Pulse 67    Ht 6' (1.829 m)    Wt 209 lb 6.4 oz (95 kg)    SpO2 98%    BMI 28.40 kg/m  General: Well Developed, well nourished, and in no acute distress.   MSK: Right foot and ankle: Normal-appearing Tender palpation along the course of the peroneal tendon and into the proximal fifth metatarsal.  Normal ankle motion.  Pain with resisted foot eversion.  Left foot and ankle normal-appearing Mildly tender palpation along the lateral ankle.  Normal ankle motion and strength.    Lab and Radiology Results  EXAM: MRI OF THE LEFT FOOT WITHOUT CONTRAST   TECHNIQUE: Multiplanar, multisequence MR imaging of the left foot was performed. No intravenous contrast was administered.   COMPARISON:  X-ray foot 01/21/2021.   FINDINGS: TENDONS   Peroneal: Mild tendinosis of the peroneus longus. Mild tendinosis of the peroneus brevis.   Posteromedial: Posterior tibial tendon intact. Flexor hallucis longus tendon intact. Flexor digitorum longus tendon intact.   Anterior:  Tibialis anterior tendon intact. Extensor hallucis longus tendon intact Extensor digitorum longus tendon intact.   Achilles:  Intact.   Plantar Fascia: Intact. Tiny plantar calcaneal spur.   LIGAMENTS   Lateral: Anterior talofibular ligament intact. Calcaneofibular ligament intact. Posterior talofibular ligament intact. Anterior and posterior tibiofibular ligaments intact.   Medial: Deltoid ligament intact. Spring ligament intact.   CARTILAGE   Ankle Joint: No joint effusion. Normal ankle mortise. No chondral defect.   Subtalar Joints/Sinus Tarsi: Normal subtalar joints. No subtalar joint effusion. Normal sinus tarsi.   Bones: No marrow signal abnormality.  No fracture or dislocation.   Soft Tissue: No fluid collection or hematoma. Muscles are normal without edema or atrophy. Tarsal tunnel is normal.   IMPRESSION: 1. Mild tendinosis of the peroneus longus. 2. Mild tendinosis of the peroneus brevis. 3. No significant arthropathy of the left ankle.     Electronically Signed   By: Kathreen Devoid M.D.   On: 01/31/2021 08:20   EXAM: MRI OF THE RIGHT FOREFOOT WITHOUT CONTRAST   TECHNIQUE: Multiplanar, multisequence MR imaging of the right forefoot was performed. No intravenous contrast was administered.   COMPARISON:  X-ray foot 01/21/2021.   FINDINGS: TENDONS   Peroneal: Mild tendinosis of the peroneus longus. Mild tendinosis of the peroneus brevis with a longitudinal split tear of the level of the lateral malleolus.   Posteromedial: Posterior tibial tendon intact.  Flexor hallucis longus tendon intact. Flexor digitorum longus tendon intact.   Anterior: Tibialis anterior tendon intact. Extensor hallucis longus tendon intact Extensor digitorum longus tendon intact.   Achilles:  Intact.   Plantar Fascia: Intact.   LIGAMENTS   Lateral: Anterior talofibular ligament intact. Calcaneofibular ligament intact. Posterior talofibular ligament intact. Anterior  and posterior tibiofibular ligaments intact.   Medial: Deltoid ligament intact. Spring ligament intact.   CARTILAGE   Ankle Joint: No joint effusion. Normal ankle mortise. No chondral defect.   Subtalar Joints/Sinus Tarsi: Normal subtalar joints. No subtalar joint effusion. Normal sinus tarsi.   Bones: No marrow signal abnormality.  No fracture or dislocation.   Soft Tissue: No fluid collection or hematoma. Muscles are normal without edema or atrophy. Tarsal tunnel is normal.   IMPRESSION: 1. Mild tendinosis of the peroneus longus. 2. Mild tendinosis of the peroneus brevis with a longitudinal split tear of the level of the lateral malleolus. 3. No significant arthropathy of right ankle.     Electronically Signed   By: Kathreen Devoid M.D.   On: 01/31/2021 08:15   I, Lynne Leader, personally (independently) visualized and performed the interpretation of the images attached in this note.   Assessment and Plan: 60 y.o. male with bilateral foot and ankle pain right worse than left.  Dominant finding of bilateral ankle MRI (MRI is listed his foot but this is much more of an ankle MRI than a foot MRI) his right peroneal brevis longitudinal split tear.  He does also have bilateral peroneal tendinitis. Plan to treat with home exercise program dedicated for peroneal tendinitis, physical therapy, nitroglycerin patch protocol, and intermittent cam walker boot as needed.  Check back in 6 weeks.  If not better consider injection or surgical referral.     PDMP not reviewed this encounter. Orders Placed This Encounter  Procedures   Ambulatory referral to Physical Therapy    Referral Priority:   Routine    Referral Type:   Physical Medicine    Referral Reason:   Specialty Services Required    Requested Specialty:   Physical Therapy    Number of Visits Requested:   1   Meds ordered this encounter  Medications   nitroGLYCERIN (NITRODUR - DOSED IN MG/24 HR) 0.2 mg/hr patch    Sig: Apply  1/4 patch daily to tendon for tendonitis.    Dispense:  30 patch    Refill:  1     Discussed warning signs or symptoms. Please see discharge instructions. Patient expresses understanding.   The above documentation has been reviewed and is accurate and complete Lynne Leader, M.D.   Total encounter time 30 minutes including face-to-face time with the patient and, reviewing past medical record, and charting on the date of service.   Reviewed MRI findings discussed treatment plan and options as well as nitroglycerin patch protocol.

## 2021-02-04 ENCOUNTER — Ambulatory Visit: Payer: Managed Care, Other (non HMO) | Admitting: Family Medicine

## 2021-02-04 ENCOUNTER — Other Ambulatory Visit: Payer: Self-pay

## 2021-02-04 VITALS — BP 130/86 | HR 67 | Ht 72.0 in | Wt 209.4 lb

## 2021-02-04 DIAGNOSIS — M79671 Pain in right foot: Secondary | ICD-10-CM | POA: Diagnosis not present

## 2021-02-04 DIAGNOSIS — M7671 Peroneal tendinitis, right leg: Secondary | ICD-10-CM | POA: Diagnosis not present

## 2021-02-04 DIAGNOSIS — M7672 Peroneal tendinitis, left leg: Secondary | ICD-10-CM | POA: Insufficient documentation

## 2021-02-04 DIAGNOSIS — S86319A Strain of muscle(s) and tendon(s) of peroneal muscle group at lower leg level, unspecified leg, initial encounter: Secondary | ICD-10-CM | POA: Insufficient documentation

## 2021-02-04 DIAGNOSIS — S86311A Strain of muscle(s) and tendon(s) of peroneal muscle group at lower leg level, right leg, initial encounter: Secondary | ICD-10-CM | POA: Diagnosis not present

## 2021-02-04 DIAGNOSIS — M79672 Pain in left foot: Secondary | ICD-10-CM | POA: Diagnosis not present

## 2021-02-04 MED ORDER — NITROGLYCERIN 0.2 MG/HR TD PT24: MEDICATED_PATCH | TRANSDERMAL | 1 refills | Status: DC

## 2021-02-04 NOTE — Patient Instructions (Addendum)
Thank you for coming in today.   I've referred you to Physical Therapy.  Let us know if you don't hear from them in one week.   Please go to [facility] supply to get the CAM walker boot we talked about today. You may also be able to get it from [employer].    Please complete the exercises that the athletic trainer went over with you:  View at [email or web address] using code: [email or web address]  Recheck back in 6 weeks.  Nitroglycerin Protocol Apply 1/4 nitroglycerin patch to affected area daily. Change position of patch within the affected area every 24 hours. You may experience a headache during the first 1-2 weeks of using the patch, these should subside. If you experience headaches after beginning nitroglycerin patch treatment, you may take your preferred over the counter pain reliever. Another side effect of the nitroglycerin patch is skin irritation or rash related to patch adhesive. Please notify our office if you develop more severe headaches or rash, and stop the patch. Tendon healing with nitroglycerin patch may require 12 to 24 weeks depending on the extent of injury. Men should not use if taking Viagra, Cialis, or Levitra.  Do not use if you have migraines or rosacea.

## 2021-02-14 ENCOUNTER — Encounter: Payer: Self-pay | Admitting: Family Medicine

## 2021-02-14 ENCOUNTER — Encounter: Payer: Self-pay | Admitting: Cardiology

## 2021-02-21 ENCOUNTER — Encounter: Payer: Self-pay | Admitting: Family Medicine

## 2021-02-21 MED ORDER — BUPROPION HCL ER (XL) 300 MG PO TB24: 300.0000 mg | ORAL_TABLET | Freq: Every day | ORAL | 1 refills | Status: DC

## 2021-02-24 ENCOUNTER — Encounter: Payer: Self-pay | Admitting: Family Medicine

## 2021-02-25 NOTE — Progress Notes (Signed)
[place] Phone: [phone] Subjective:   Dakota [name], am serving as a scribe for Dr. Hulan Saas.This visit occurred during the SARS-CoV-2 public health emergency.  Safety protocols were in place, including screening questions prior to the visit, additional usage of staff PPE, and extensive cleaning of exam room while observing appropriate contact time as indicated for disinfecting solutions.  I'm seeing this patient by the request  of:  Dakota Lukes, MD  CC: Bilateral foot and ankle pain.  Dakota [name]  12/23/2020 Patient does have what appears to be more of 1/5 stress reaction or stress fracture.  At this moment.  Seems to be proximal.  Does have some mild swelling.  We discussed potential advanced imaging but patient declined even the x-ray at the moment.  Patient would like to try conservative therapy.  Discussed rigid soled shoes, avoiding being barefoot, increasing vitamin D, icing regimen.  Patient does have a past medical history significant for gout.  Patient also has a significant history of a deep venous thrombosis.  Discussed the possibility of further imaging for this which patient declined as well.  Do not think that this is likely with [name] calf pain.  Patient will try the conservative therapy and follow-up with me again in 6  weeks.  Worsening pain I would like to see patient sooner.  Update 02/26/2021 Dakota [name] is a 61 y.o. male coming in with complaint of B foot pain.  Patient continued to have pain and we did order MRIs of the foot and ankles bilaterally.  Patient was found to have more of a mild peroneal tendinosis bilaterally but does have a longitudinal split tear at the level of the lateral malleolus on the right side and questionable early on the left side.  Patient states that recently has noticed some more increasing discomfort on the left side.     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   Kidney stone on left side 09/19/2013   Left knee pain 12/25/2012   Mumps as a child   Onychomycosis 09/30/2014   Overweight 12/16/2015   Preventative health care 09/24/2013   Past Surgical History:  Procedure Laterality Date   COLONOSCOPY     COLONOSCOPY WITH PROPOFOL N/A 12/04/2019   Procedure: COLONOSCOPY WITH PROPOFOL;  Surgeon: Irving Copas., MD;  Location: [facility];  Service: Gastroenterology;  Laterality: N/A;  ultra slim scope    CORONARY STENT PLACEMENT  01-14-05   LITHOTRIPSY  2/16   POLYPECTOMY  12/04/2019   Procedure: POLYPECTOMY;  Surgeon: [name], Telford Nab., MD;  Location: [facility] ENDOSCOPY;  Service: Gastroenterology;;   Social History   Socioeconomic History   Marital status: Married    Spouse name: Not on file   Number of children: 2   Years of education: Not on file   Highest education level: Not on file  Occupational History   Occupation: real Investment banker, corporate  Tobacco Use   Smoking status: Former  Packs/day: 0.50    Years: 20.00    Pack years: 10.00    Types: Cigarettes    Quit date: 02/20/2015    Years since quitting: 6.0   Smokeless tobacco: Never  Vaping Use   Vaping Use: Every day  Substance and Sexual Activity   Alcohol use: Yes    Comment: 10 drinks per week   Drug use: [name]   Sexual activity: Yes    Partners: Female    Comment: wife, real Investment banker, corporate, [name] dairy, minimal fried foods  Other Topics Concern   Not on file  Social History Narrative   1 biological son   1 adopted child   3 step children   Social Determinants of  Radio broadcast assistant Strain: Not on file  Food Insecurity: Not on file  Transportation Needs: Not on file  Physical Activity: Not on file  Stress: Not on file  Social Connections: Not on file   Allergies  Allergen Reactions   Neosporin [Neomycin-Bacitracin Zn-Polymyx] Hives and Itching   Polysporin [Bacitracin-Polymyxin B] Hives and Itching   Amoxicillin Itching and Rash   Family History  Problem Relation Age of Onset   Multiple sclerosis Mother    Cancer Father        esophagus/ brain   Alcohol abuse Father        smoker   Esophageal cancer Father    Tremor Father    Seizures Sister        AVM   COPD Sister    Alcohol abuse Sister        addiction, recovered   Tremor Son    Cancer Paternal Aunt        leukemia   Heart disease Maternal Grandfather        MI   Colon cancer Neg Hx    Colon polyps Neg Hx    Rectal cancer Neg Hx    Stomach cancer Neg Hx      Current Outpatient Medications (Cardiovascular):    atorvastatin (LIPITOR) 80 MG tablet, TAKE 1 TABLET DAILY AT 6 P.M. (SCHEDULE AN APPOINTMENT FOR FUTURE REFILLS)   ezetimibe (ZETIA) 10 MG tablet, Take 1 tablet (10 mg total) by mouth daily.   losartan (COZAAR) 50 MG tablet, Take 1 tablet (50 mg total) by mouth daily.   metoprolol succinate (TOPROL-XL) 50 MG 24 hr tablet, TAKE 1 TABLET DAILY TAKE WITH OR IMMEDIATELY FOLLOWING A MEAL   nitroGLYCERIN (NITRODUR - DOSED IN MG/24 HR) 0.2 mg/hr patch, Apply 1/4 patch daily to tendon for tendonitis.  Current Outpatient Medications (Respiratory):    cetirizine (ZYRTEC) 10 MG tablet, Take 10 mg by mouth daily as needed for allergies.  Current Outpatient Medications (Analgesics):    allopurinol (ZYLOPRIM) 100 MG tablet, Take 1 tablet (100 mg total) by mouth daily.   aspirin EC 81 MG tablet, Take 1 tablet (81 mg total) by mouth daily. Swallow whole.   indomethacin (INDOCIN) 50 MG capsule, Take 1 capsule (50 mg total) by mouth 2 (two) times daily as  needed.   Current Outpatient Medications (Other):    buPROPion (WELLBUTRIN XL) 300 MG 24 hr tablet, Take 1 tablet (300 mg total) by mouth daily.   clobetasol ointment (TEMOVATE) 6.30 %, Apply 1 application topically 2 (two) times daily as needed (psoriasis).   Coenzyme Q10 300 MG CAPS, Take 300 mg by mouth every other day.   lansoprazole (PREVACID) 15 MG capsule, Take 15 mg by mouth daily as needed (acid reflux).  Melatonin 10 MG CAPS, Take 10 mg by mouth at bedtime as needed (sleep).   Multiple Vitamin (MULTIVITAMIN WITH MINERALS) TABS tablet, Take 1 tablet by mouth daily.   mupirocin ointment (BACTROBAN) 2 %, Place 1 application into the nose 2 (two) times daily. (Patient taking differently: Place 1 application into the nose 2 (two) times daily as needed (wound care).)   rOPINIRole (REQUIP) 0.5 MG tablet, Take 1-2 tablets (0.5-1 mg total) by mouth at bedtime.   tiZANidine (ZANAFLEX) 2 MG tablet, Take 0.5-2 tablets (1-4 mg total) by mouth every 8 (eight) hours as needed for muscle spasms. (Patient not taking: Reported on 02/04/2021)   zolpidem (AMBIEN) 10 MG tablet, Take 0.5-1 tablets (5-10 mg total) by mouth at bedtime as needed. for sleep     Objective  Blood pressure 124/82, pulse 64, height 6' (1.829 m), weight 211 lb (95.7 kg), SpO2 98 %.   General: [name] apparent distress alert and oriented x3 mood and affect normal, dressed appropriately.  HEENT: Pupils equal, extraocular movements intact  Respiratory: Patient's speak in full sentences and does not appear short of breath  Cardiovascular: [name] lower extremity edema, non tender, [name] erythema  MSK: Sitting comfortably.  Patient does have still some mild swelling noted of the posterior inferior malleolus area consistent with the peroneal tendon  Procedure: Real-time Ultrasound Guided Injection of right peroneal tendon sheath Device: GE Logiq Q7 Ultrasound guided injection is preferred based studies that show increased duration, increased  effect, greater accuracy, decreased procedural pain, increased response rate, and decreased cost with ultrasound guided versus blind injection.  Verbal informed consent obtained.  Time-out conducted.  Noted [name] overlying erythema, induration, or other signs of local infection.  Skin prepped in a sterile fashion.  Local anesthesia: Topical Ethyl chloride.  With sterile technique and under real time ultrasound guidance: 21-gauge needle injected with 0.5 cc of 0.5% Marcaine and then injected with 2.5 cc of PRP. Completed without difficulty  Advised to call if fevers/chills, erythema, induration, drainage, or persistent bleeding.  Impression: Technically successful ultrasound guided injection.  Procedure: Real-time Ultrasound Guided Injection of left peroneal tendon sheath Device: GE Logiq Q7 Ultrasound guided injection is preferred based studies that show increased duration, increased effect, greater accuracy, decreased procedural pain, increased response rate, and decreased cost with ultrasound guided versus blind injection.  Verbal informed consent obtained.  Time-out conducted.  Noted [name] overlying erythema, induration, or other signs of local infection.  Skin prepped in a sterile fashion.  Local anesthesia: Topical Ethyl chloride.  With sterile technique and under real time ultrasound guidance: With a 21-gauge needle injected with 0.5 cc of 0.5% Marcaine and injected with 2 cc of PRP Completed without difficulty  Advised to call if fevers/chills, erythema, induration, drainage, or persistent bleeding.  Impression: Technically successful ultrasound guided injection.    Impression and Recommendations:     The above documentation has been reviewed and is accurate and complete Lyndal Pulley, DO

## 2021-02-26 ENCOUNTER — Other Ambulatory Visit: Payer: Self-pay

## 2021-02-26 ENCOUNTER — Encounter: Payer: Self-pay | Admitting: Family Medicine

## 2021-02-26 ENCOUNTER — Ambulatory Visit: Payer: Self-pay

## 2021-02-26 ENCOUNTER — Ambulatory Visit (INDEPENDENT_AMBULATORY_CARE_PROVIDER_SITE_OTHER): Payer: Self-pay | Admitting: Family Medicine

## 2021-02-26 VITALS — BP 124/82 | HR 64 | Ht 72.0 in | Wt 211.0 lb

## 2021-02-26 DIAGNOSIS — M79671 Pain in right foot: Secondary | ICD-10-CM

## 2021-02-26 DIAGNOSIS — M7671 Peroneal tendinitis, right leg: Secondary | ICD-10-CM

## 2021-02-26 DIAGNOSIS — M79672 Pain in left foot: Secondary | ICD-10-CM

## 2021-02-26 DIAGNOSIS — M7672 Peroneal tendinitis, left leg: Secondary | ICD-10-CM

## 2021-02-26 NOTE — Assessment & Plan Note (Signed)
Bilateral injections given today and tolerated the procedure well, discussed icing regimen and home exercises but did this in the post PRP protocol.  I am optimistic patient should do well with conservative therapy..  Discussed avoiding certain activities.  Follow-up with me again in 6 to 8 weeks

## 2021-02-26 NOTE — Patient Instructions (Signed)
PRP injection today No ice or IBU for 3 days See me in 4-6 weeks

## 2021-03-06 NOTE — Progress Notes (Signed)
Dakota [name]'s [place] Phone: [phone] Subjective:    I'm seeing this patient by the request  of:  [name], MD  CC: Left knee pain and swelling  [id number]  02/26/2021 Bilateral injections given today and tolerated the procedure well, discussed icing regimen and home exercises but did this in the post PRP protocol.  I am optimistic patient should do well with conservative therapy..  Discussed avoiding certain activities.  Follow-up with me again in 6 to 8 weeks  Update 03/11/2021 Dakota [name] is a 61 y.o. male coming in with complaint of L knee pain. Seen for L knee pain in 2014. Patient states about a week ago pain started, couldn't get out of bed. Hurts with knee flexion and putting pressure on it. Sharp pain posterior patella.       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   Kidney stone on left side 09/19/2013   Left knee pain 12/25/2012   Mumps as a child   Onychomycosis 09/30/2014   Overweight 12/16/2015   Preventative health care 09/24/2013   Past Surgical History:  Procedure Laterality Date   COLONOSCOPY     COLONOSCOPY WITH PROPOFOL N/A 12/04/2019   Procedure: COLONOSCOPY WITH PROPOFOL;  Surgeon: Irving Copas., MD;  Location: [facility];  Service: Gastroenterology;  Laterality: N/A;  ultra slim scope    CORONARY STENT PLACEMENT  01-14-05   LITHOTRIPSY  2/16   POLYPECTOMY  12/04/2019   Procedure: POLYPECTOMY;  Surgeon: [name], Telford Nab., MD;  Location: [facility] ENDOSCOPY;  Service: Gastroenterology;;    Social History   Socioeconomic History   Marital status: Married    Spouse name: Not on file   Number of children: 2   Years of education: Not on file   Highest education level: Not on file  Occupational History   Occupation: real Investment banker, corporate  Tobacco Use   Smoking status: Former    Packs/day: 0.50    Years: 20.00    Pack years: 10.00    Types: Cigarettes    Quit date: 02/20/2015    Years since quitting: 6.0   Smokeless tobacco: Never  Vaping Use   Vaping Use: Every day  Substance and Sexual Activity   Alcohol use: Yes    Comment: 10 drinks per week   Drug use: No   Sexual activity: Yes    Partners: Female    Comment: wife, real Investment banker, corporate, no dairy, minimal fried foods  Other Topics Concern   Not on file  Social History Narrative   1 biological son   1 adopted child   3 step children   Social Determinants of Health   Financial Resource Strain: Not on file  Food Insecurity: Not on file  Transportation Needs: Not on file  Physical Activity: Not on file  Stress: Not on file  Social Connections: Not on file   Allergies  Allergen Reactions   Neosporin [Neomycin-Bacitracin Zn-Polymyx] Hives and Itching   Polysporin [  Bacitracin-Polymyxin B] Hives and Itching   Amoxicillin Itching and Rash   Family History  Problem Relation Age of Onset   Multiple sclerosis Mother    Cancer Father        esophagus/ brain   Alcohol abuse Father        smoker   Esophageal cancer Father    Tremor Father    Seizures Sister        AVM   COPD Sister    Alcohol abuse Sister        addiction, recovered   Tremor Son    Cancer Paternal Aunt        leukemia   Heart disease Maternal Grandfather        MI   Colon cancer Neg Hx    Colon polyps Neg Hx    Rectal cancer Neg Hx    Stomach cancer Neg Hx      Current Outpatient Medications (Cardiovascular):    atorvastatin (LIPITOR) 80 MG tablet, TAKE 1 TABLET DAILY AT 6 P.M. (SCHEDULE AN APPOINTMENT FOR FUTURE  REFILLS)   ezetimibe (ZETIA) 10 MG tablet, Take 1 tablet (10 mg total) by mouth daily.   losartan (COZAAR) 50 MG tablet, Take 1 tablet (50 mg total) by mouth daily.   metoprolol succinate (TOPROL-XL) 50 MG 24 hr tablet, TAKE 1 TABLET DAILY TAKE WITH OR IMMEDIATELY FOLLOWING A MEAL   nitroGLYCERIN (NITRODUR - DOSED IN MG/24 HR) 0.2 mg/hr patch, Apply 1/4 patch daily to tendon for tendonitis.  Current Outpatient Medications (Respiratory):    cetirizine (ZYRTEC) 10 MG tablet, Take 10 mg by mouth daily as needed for allergies.  Current Outpatient Medications (Analgesics):    allopurinol (ZYLOPRIM) 100 MG tablet, Take 2 tablets (200 mg total) by mouth daily.   aspirin EC 81 MG tablet, Take 1 tablet (81 mg total) by mouth daily. Swallow whole.   indomethacin (INDOCIN) 50 MG capsule, Take 1 capsule (50 mg total) by mouth 2 (two) times daily as needed.   Current Outpatient Medications (Other):    buPROPion (WELLBUTRIN XL) 300 MG 24 hr tablet, Take 1 tablet (300 mg total) by mouth daily.   clobetasol ointment (TEMOVATE) 5.78 %, Apply 1 application topically 2 (two) times daily as needed (psoriasis).   Coenzyme Q10 300 MG CAPS, Take 300 mg by mouth every other day.   lansoprazole (PREVACID) 15 MG capsule, Take 15 mg by mouth daily as needed (acid reflux).   Melatonin 10 MG CAPS, Take 10 mg by mouth at bedtime as needed (sleep).   Multiple Vitamin (MULTIVITAMIN WITH MINERALS) TABS tablet, Take 1 tablet by mouth daily.   mupirocin ointment (BACTROBAN) 2 %, Place 1 application into the nose 2 (two) times daily. (Patient taking differently: Place 1 application into the nose 2 (two) times daily as needed (wound care).)   rOPINIRole (REQUIP) 0.5 MG tablet, Take 1-2 tablets (0.5-1 mg total) by mouth at bedtime.   tiZANidine (ZANAFLEX) 2 MG tablet, Take 0.5-2 tablets (1-4 mg total) by mouth every 8 (eight) hours as needed for muscle spasms. (Patient not taking: Reported on 02/04/2021)   zolpidem (AMBIEN) 10 MG  tablet, Take 0.5-1 tablets (5-10 mg total) by mouth at bedtime as needed. for sleep   Reviewed prior external information including notes and imaging from  primary care provider As well as notes that were available from care everywhere and other healthcare systems.  Past medical history, social, surgical and family history all reviewed in electronic medical record.  No pertanent information  unless stated regarding to the chief complaint.   Review of Systems:  No headache, visual changes, nausea, vomiting, diarrhea, constipation, dizziness, abdominal pain, skin rash, fevers, chills, night sweats, weight loss, swollen lymph nodes,  chest pain, shortness of breath, mood changes. POSITIVE muscle aches, body aches, joint swelling  Objective  Blood pressure 136/84, pulse 95, height 6' (1.829 m), weight 210 lb (95.3 kg), SpO2 97 %.   General: No apparent distress alert and oriented x3 mood and affect normal, dressed appropriately.  HEENT: Pupils equal, extraocular movements intact  Respiratory: Patient's speak in full sentences and does not appear short of breath  Cardiovascular: No lower extremity edema, non tender, no erythema  Severely antalgic favoring the left knee.  Patient does have only approximately 40 degrees of flexion.  Patient does have an effusion noted at the patellofemoral joint.  Tender to palpation over the medial joint line.  Positive McMurray's noted.  Limited muscular skeletal ultrasound was performed and interpreted by Hulan Saas, M  Limited ultrasound patient's left knee does show the patient does have what appears to be hypoechoic changes with displacement noted of the medial meniscus.  Patient does have an effusion noted with some synovitis.  Questionable calcific changes that could be consistent with gout. Impression: Early effusion with medial meniscal tear  Procedure: Real-time Ultrasound Guided Injection of left knee Device: GE Logiq Q7 Ultrasound guided injection  is preferred based studies that show increased duration, increased effect, greater accuracy, decreased procedural pain, increased response rate, and decreased cost with ultrasound guided versus blind injection.  Verbal informed consent obtained.  Time-out conducted.  Noted no overlying erythema, induration, or other signs of local infection.  Skin prepped in a sterile fashion.  Local anesthesia: Topical Ethyl chloride.  With sterile technique and under real time ultrasound guidance: With a 22-gauge 2 inch needle patient was injected with 4 cc of 0.5% Marcaine and aspirated 20 cc of sterile water, fluid with what appears to be crystal formation.  Then injected 1 cc of Kenalog 40 mg/dL. This was from a superior lateral approach.  Completed without difficulty  Pain immediately resolved suggesting accurate placement of the medication.  Advised to call if fevers/chills, erythema, induration, drainage, or persistent bleeding.  Impression: Technically successful ultrasound guided injection.    Impression and Recommendations:     The above documentation has been reviewed and is accurate and complete Lyndal Pulley, DO

## 2021-03-11 ENCOUNTER — Other Ambulatory Visit: Payer: Self-pay

## 2021-03-11 ENCOUNTER — Ambulatory Visit (INDEPENDENT_AMBULATORY_CARE_PROVIDER_SITE_OTHER): Payer: Managed Care, Other (non HMO)

## 2021-03-11 ENCOUNTER — Ambulatory Visit: Payer: Managed Care, Other (non HMO) | Admitting: Family Medicine

## 2021-03-11 ENCOUNTER — Encounter: Payer: Self-pay | Admitting: Family Medicine

## 2021-03-11 ENCOUNTER — Ambulatory Visit: Payer: Self-pay

## 2021-03-11 VITALS — BP 136/84 | HR 95 | Ht 72.0 in | Wt 210.0 lb

## 2021-03-11 DIAGNOSIS — M10062 Idiopathic gout, left knee: Secondary | ICD-10-CM | POA: Diagnosis not present

## 2021-03-11 DIAGNOSIS — M109 Gout, unspecified: Secondary | ICD-10-CM | POA: Insufficient documentation

## 2021-03-11 DIAGNOSIS — M25462 Effusion, left knee: Secondary | ICD-10-CM | POA: Insufficient documentation

## 2021-03-11 DIAGNOSIS — M25562 Pain in left knee: Secondary | ICD-10-CM

## 2021-03-11 IMAGING — DX DG KNEE AP/LAT W/ SUNRISE*L*
3 series · 3 of 3 positions shown · non-contrast
Comparison: X-ray knee [DATE].

CLINICAL DATA: Left knee pain related to an injury 1 week ago.

EXAM:
LEFT KNEE 3 VIEWS

[knee ap]
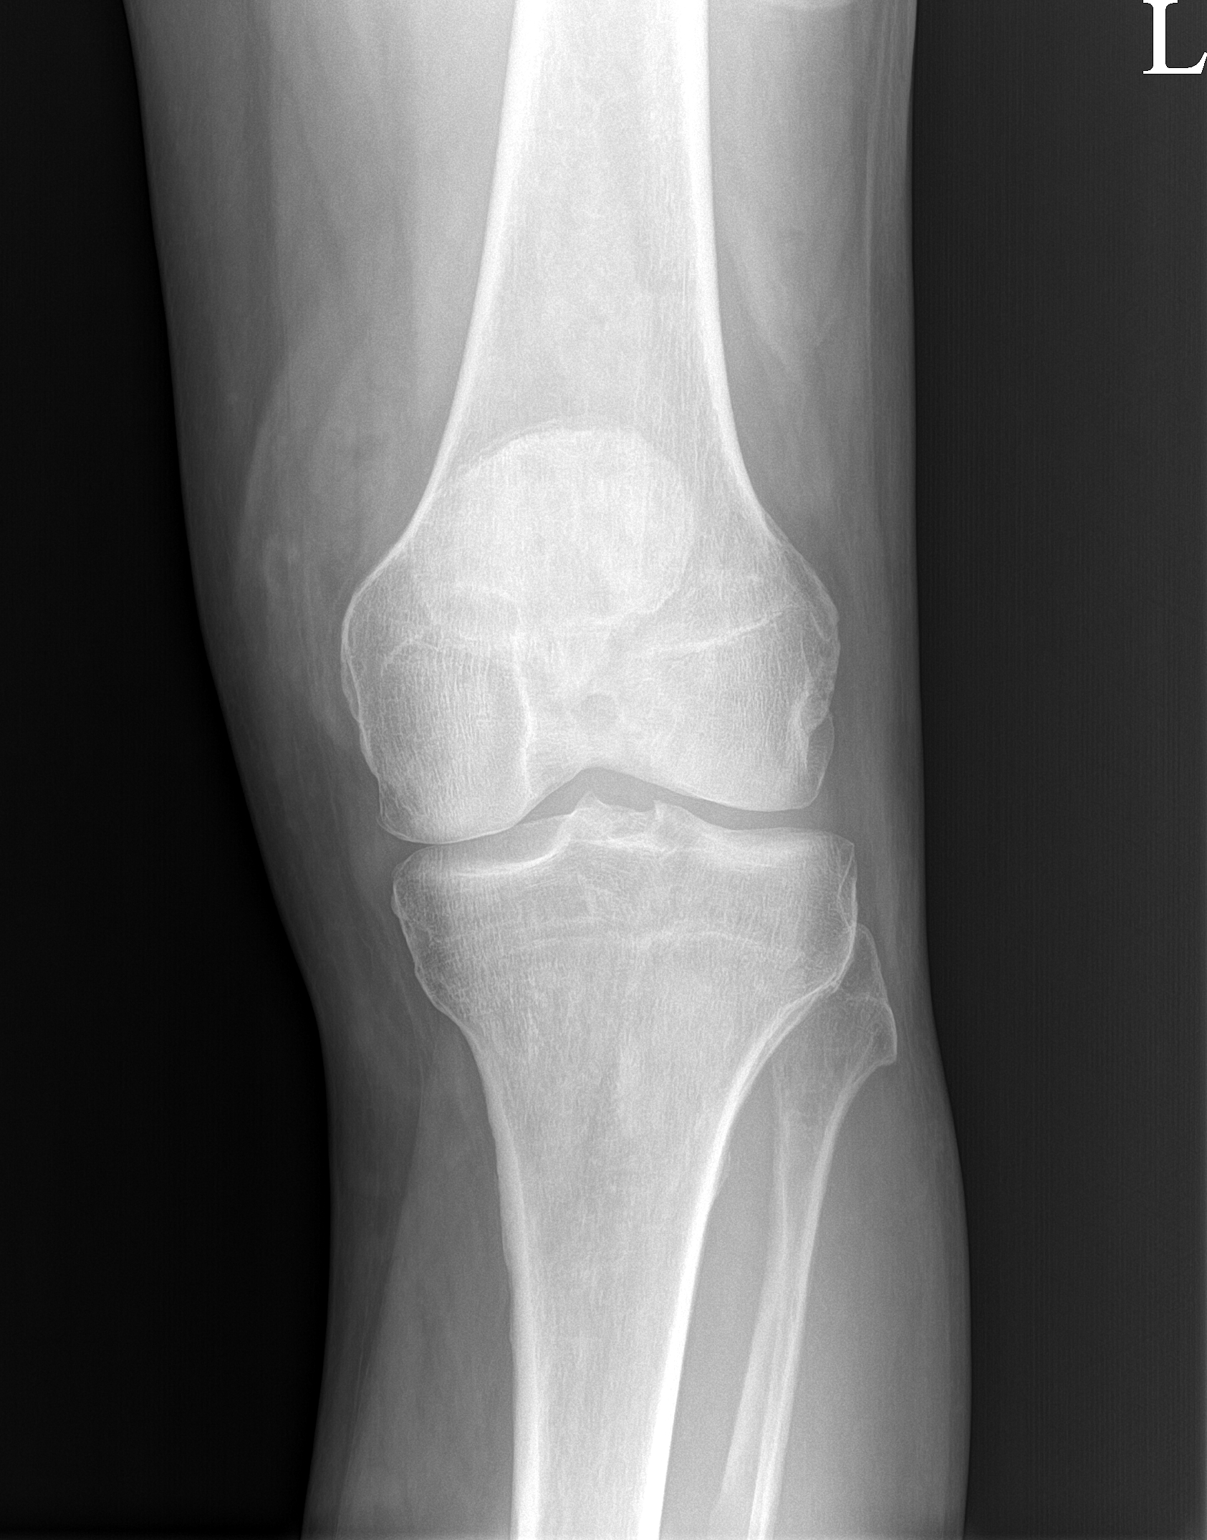

[knee lat]
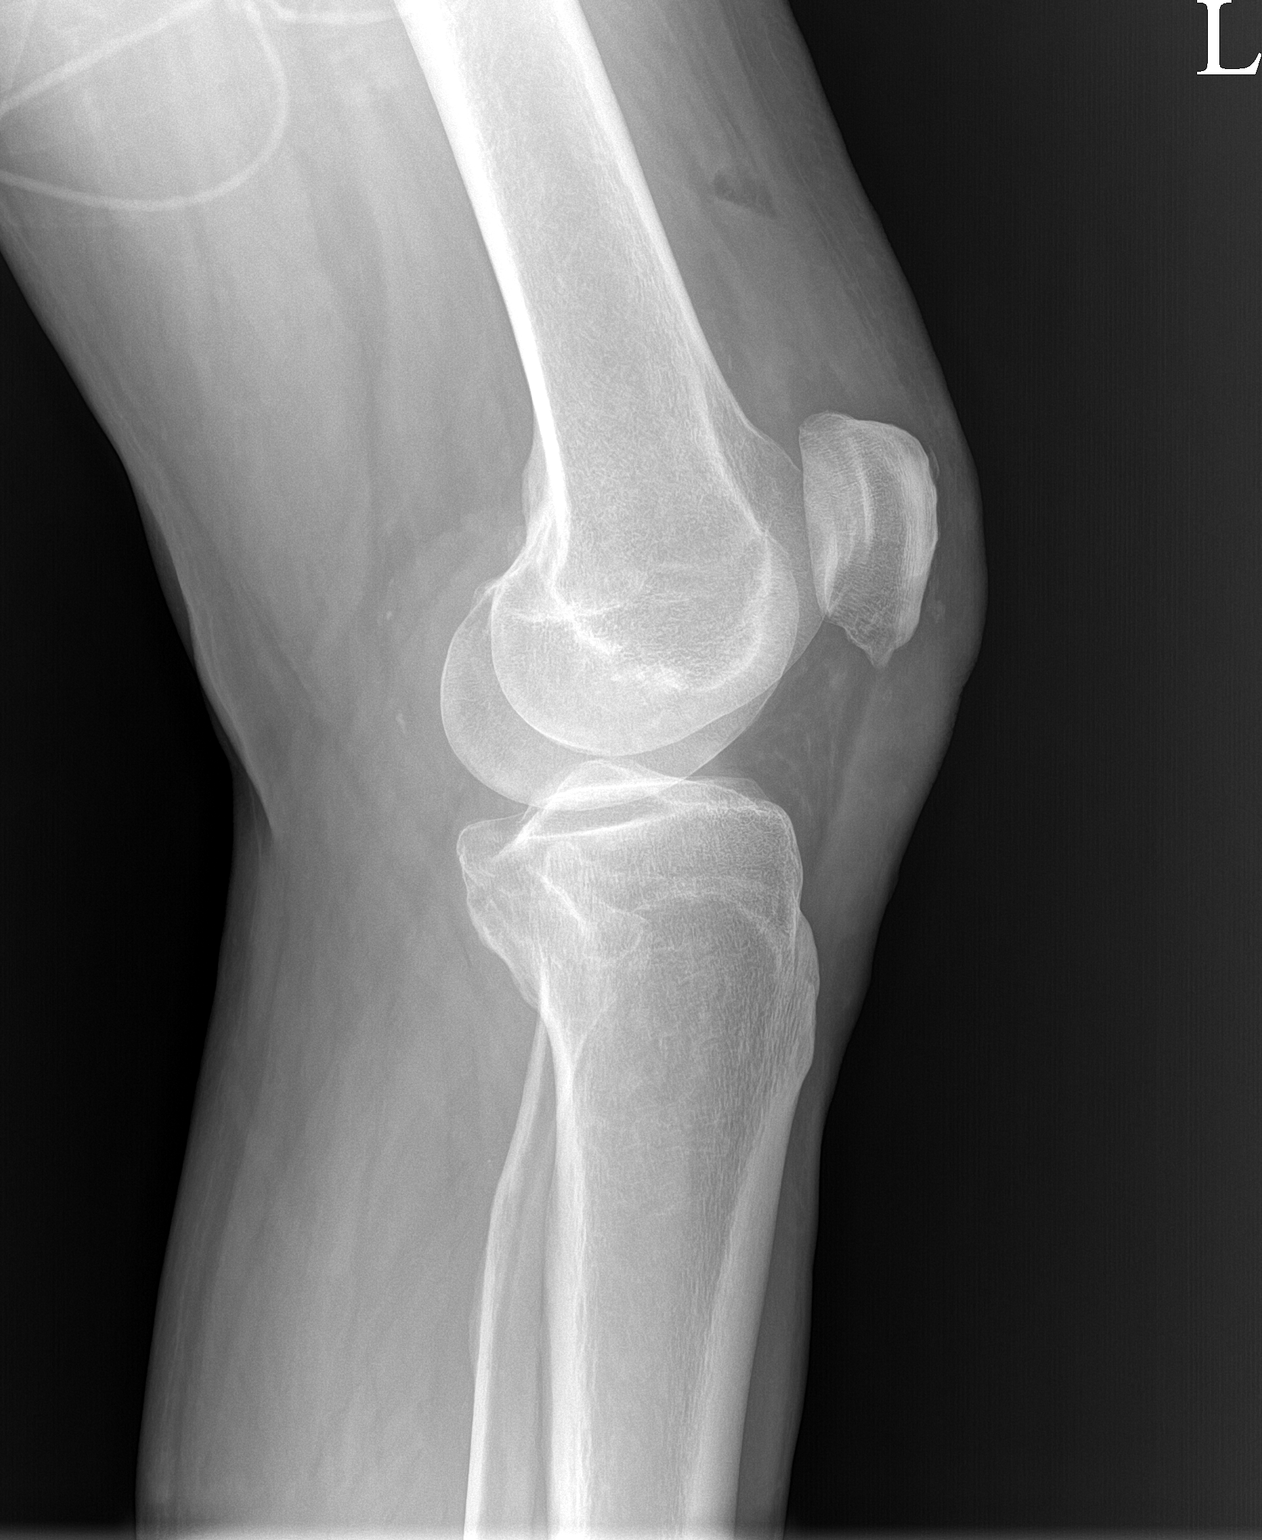

[patella]
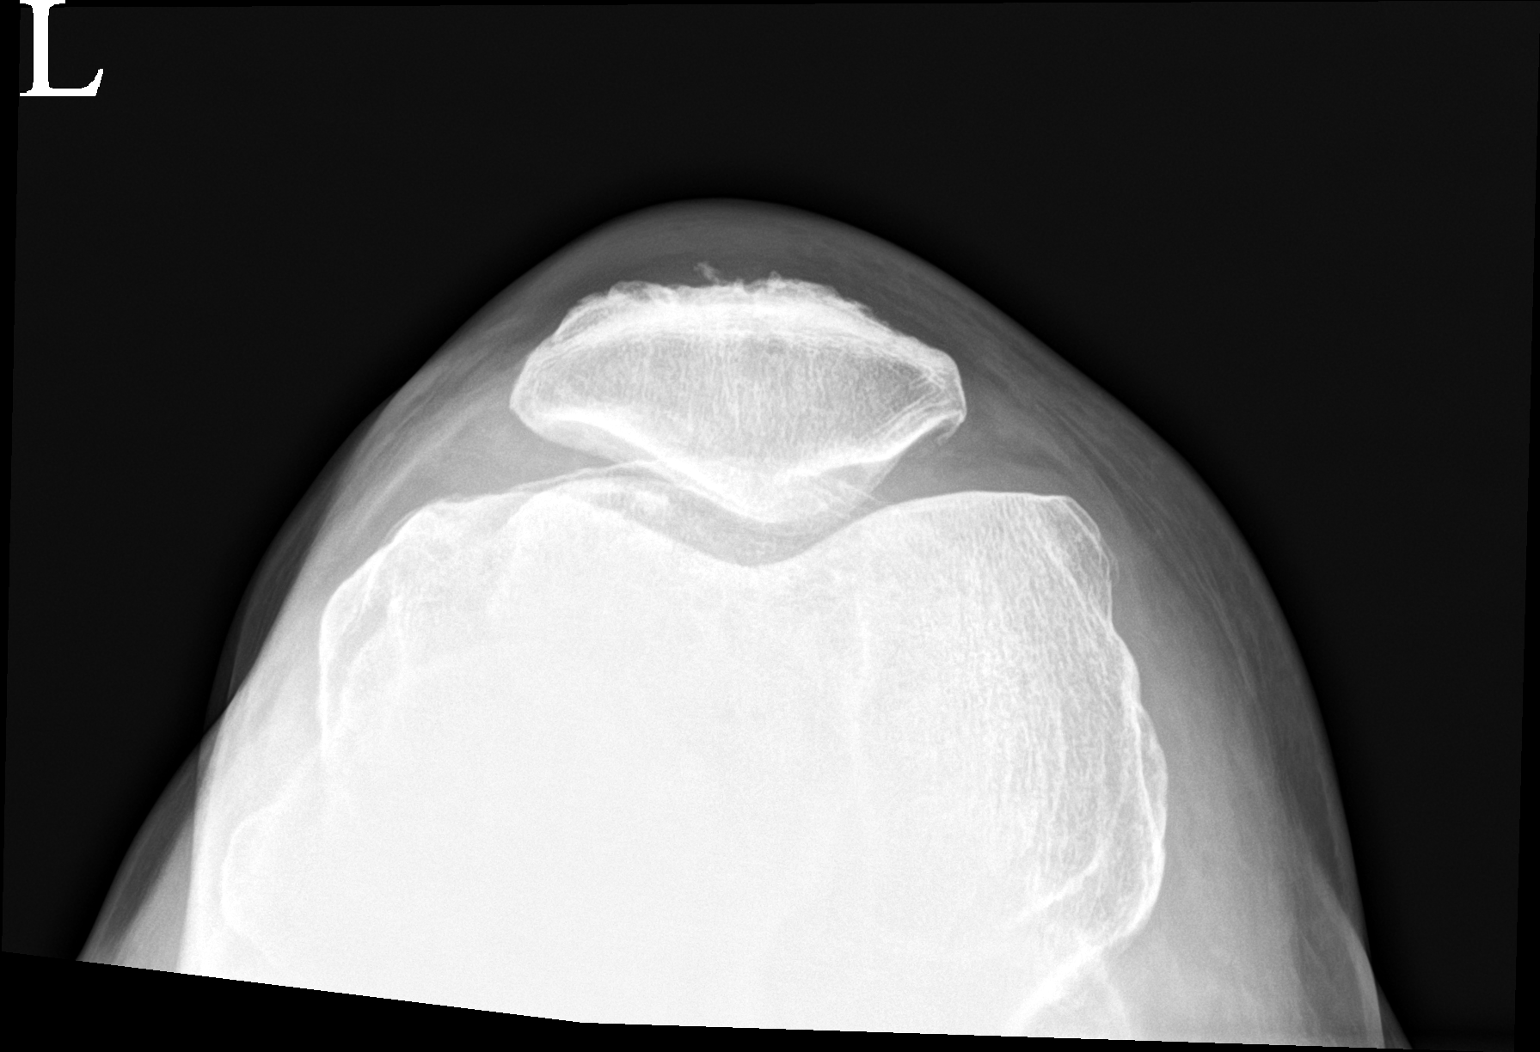

[3 of 3 positions shown; findings below may reference images not displayed]

FINDINGS: Mild tricompartment degenerative change. No acute bony or joint
abnormality. No evidence of fracture or dislocation. Mild soft
tissue swelling may be present. A focal air or fat collection is
noted over the suprapatellar space. Question has the patient had a
recent injection. Small left knee joint effusion cannot be excluded.
Further evaluation of the left knee can be obtained with MRI as
needed. Peripheral vascular calcification.
IMPRESSION: 1. Mild tricompartment degenerative change. No acute bony or joint
abnormality.

2. Soft tissue swelling may be present. A focal air or fat
collection is noted over the suprapatellar space. Question has the
patient had a recent injection. Small left knee joint effusion
cannot be excluded. Further evaluation of the left knee can be
obtained with MRI as needed.

3.  Peripheral vascular disease.

## 2021-03-11 MED ORDER — ALLOPURINOL 100 MG PO TABS: 200.0000 mg | ORAL_TABLET | Freq: Every day | ORAL | 1 refills | Status: DC

## 2021-03-11 NOTE — Assessment & Plan Note (Signed)
Patient had aspiration done.  I do believe the patient does have underlying gout that could be contributing.  I am concerned that the patient does have what appears to be an acute meniscal tear with displacement.  Discussed with patient if no significant improvement in the next 96 hours we do need to consider advanced imaging.  Discussed icing regimen and home exercises otherwise.  Patient work with Product/process development scientist.  Follow-up again in 4 weeks

## 2021-03-11 NOTE — Patient Instructions (Addendum)
Drained knee today Do prescribed exercises at least 3x a week (Start on Monday) Xray today Increase allopurinol to 200mg  daily See you again in 4 weeks

## 2021-03-11 NOTE — Assessment & Plan Note (Signed)
Patient on aspiration is about appears to be crystals noted.  We will send to lab for further evaluation.  Discussed icing regimen and home exercises.  Discussed different diet choices that I think will be beneficial for any individuals well.  Follow-up with me again in 4 weeks.  Patient has had a past medical history significant for DVT but is not having any difficulty at this time when it comes to that with no significant swelling of the lower extremity.

## 2021-03-12 LAB — SYNOVIAL FLUID ANALYSIS, COMPLETE
Basophils, %: 0 %
Eosinophils-Synovial: 0 % (ref 0–2)
Lymphocytes-Synovial Fld: 52 % (ref 0–74)
Monocyte/Macrophage: 16 % (ref 0–69)
Neutrophil, Synovial: 32 % — ABNORMAL HIGH (ref 0–24)
Synoviocytes, %: 0 % (ref 0–15)
WBC, Synovial: 146 cells/uL (ref <150)

## 2021-03-14 ENCOUNTER — Encounter: Payer: Self-pay | Admitting: Family Medicine

## 2021-03-16 ENCOUNTER — Other Ambulatory Visit: Payer: Self-pay | Admitting: Family Medicine

## 2021-03-18 ENCOUNTER — Encounter: Payer: Self-pay | Admitting: Family Medicine

## 2021-03-19 ENCOUNTER — Ambulatory Visit: Payer: Managed Care, Other (non HMO) | Admitting: Family Medicine

## 2021-03-19 ENCOUNTER — Other Ambulatory Visit: Payer: Self-pay

## 2021-03-19 DIAGNOSIS — M25562 Pain in left knee: Secondary | ICD-10-CM

## 2021-03-19 DIAGNOSIS — G8929 Other chronic pain: Secondary | ICD-10-CM

## 2021-03-20 ENCOUNTER — Encounter: Payer: Self-pay | Admitting: Family Medicine

## 2021-03-20 ENCOUNTER — Ambulatory Visit
Admission: RE | Admit: 2021-03-20 | Discharge: 2021-03-20 | Disposition: A | Discharge status: Home / Self Care | Payer: Managed Care, Other (non HMO) | Source: Ambulatory Visit | Attending: Family Medicine | Admitting: Family Medicine

## 2021-03-20 DIAGNOSIS — G8929 Other chronic pain: Secondary | ICD-10-CM

## 2021-03-20 DIAGNOSIS — M25562 Pain in left knee: Secondary | ICD-10-CM

## 2021-03-20 IMAGING — MR MR KNEE*L* W/O CM
6 series · 40 of 40 positions shown · non-contrast
Comparison: Left knee x-rays dated [DATE].

CLINICAL DATA: Severe left knee pain for the past few weeks. No
injury or prior surgery.

EXAM:
MRI OF THE LEFT KNEE WITHOUT CONTRAST
TECHNIQUE: Multiplanar, multisequence MR imaging of the knee was performed. No
intravenous contrast was administered.

[Series 6: T2 fat-sat · axial · left · 4.0mm · 0.50mm/px · z∈[-16,+137]mm · 9 of 36 slices shown (1 of 3)]
[im 1/36]
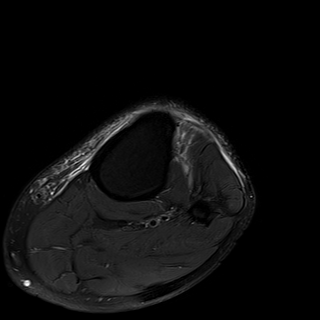
[im 5/36]
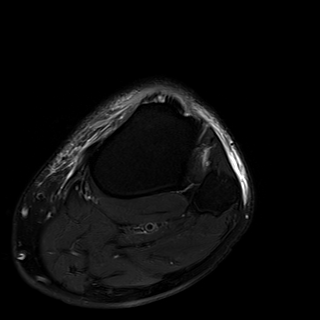
[im 9/36]
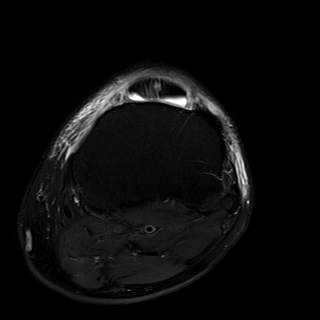
[im 14/36]
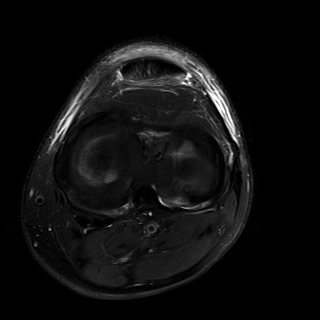
[im 18/36]
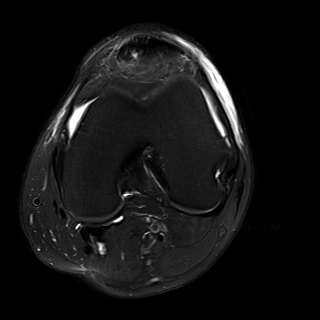
[im 22/36]
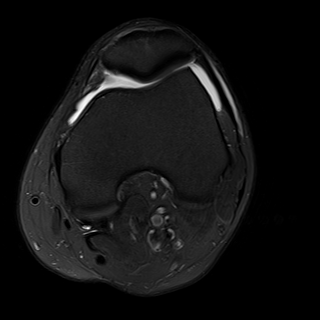
[im 27/36]
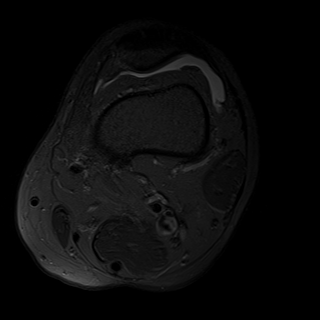
[im 31/36]
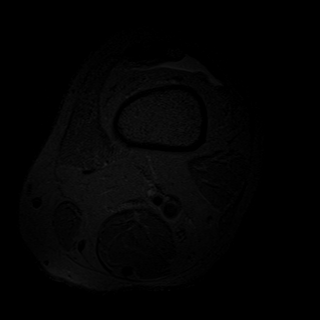
[im 36/36]
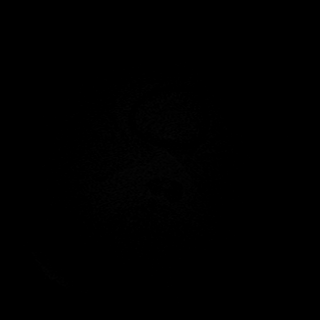

[Series 7: T2 fat-sat · coronal · left · 4.0mm · 0.47mm/px · 6 of 28 slices shown (2 of 3)]
[im 1/28]
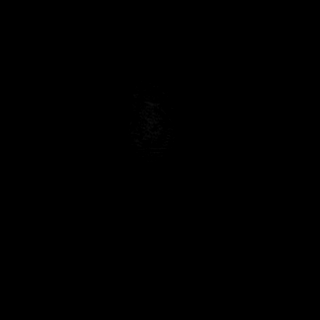
[im 6/28]
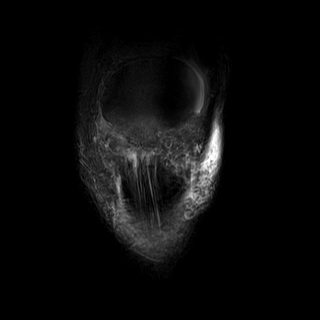
[im 11/28]
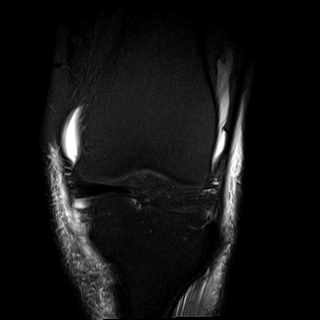
[im 17/28]
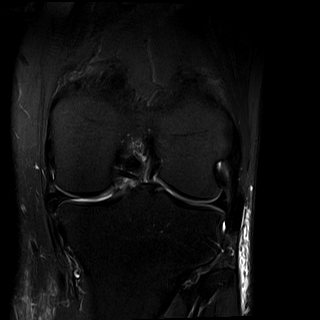
[im 22/28]
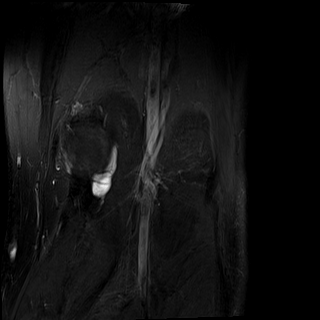
[im 28/28]
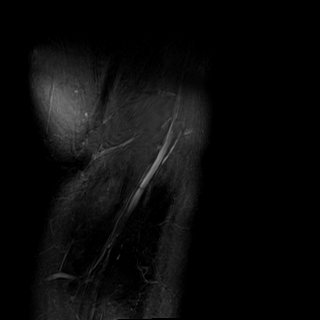

[Series 8: T1 · coronal · left · 4.0mm · 0.47mm/px · 6 of 28 slices shown]
[im 1/28]
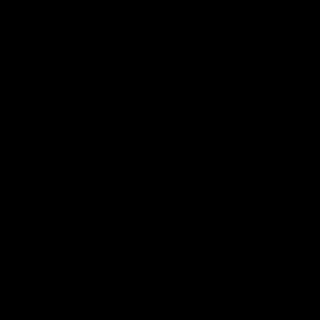
[im 6/28]
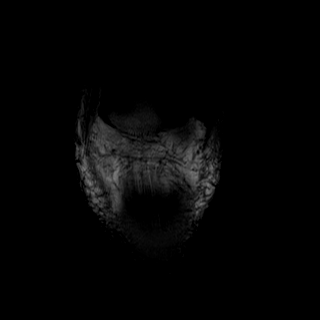
[im 11/28]
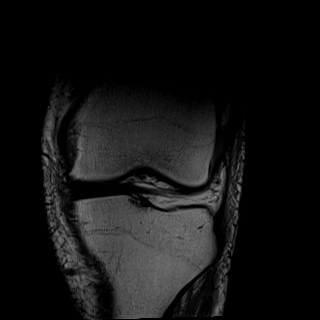
[im 17/28]
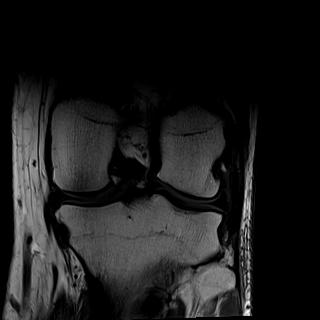
[im 22/28]
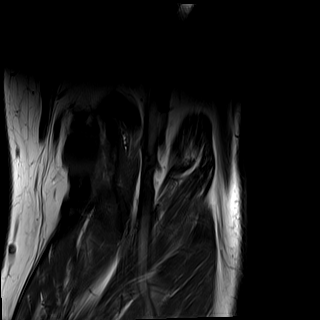
[im 28/28]
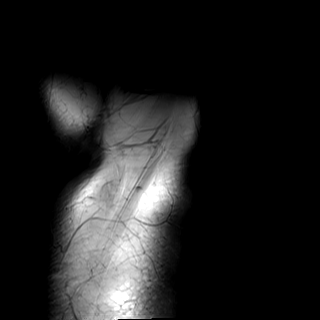

[Series 9: PD fat-sat · coronal · left · 3.0mm · 0.47mm/px · 7 of 30 slices shown (1 of 2)]
[im 1/30]
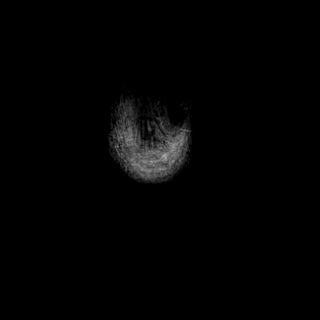
[im 5/30]
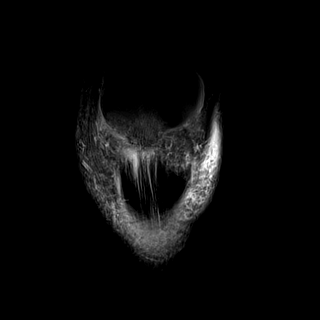
[im 10/30]
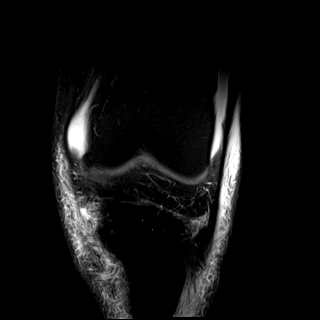
[im 15/30]
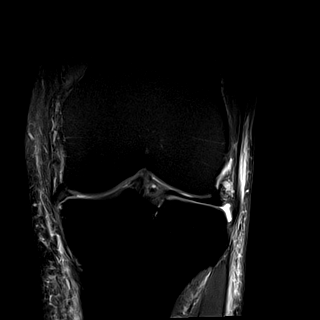
[im 20/30]
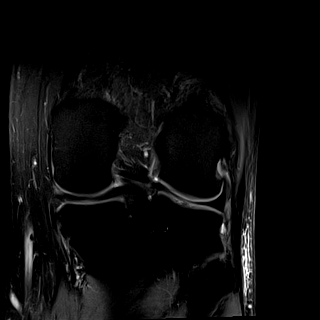
[im 25/30]
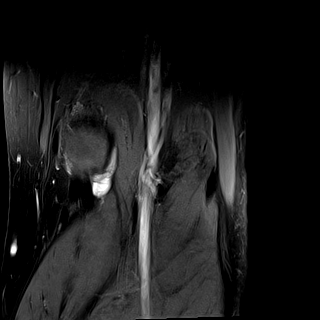
[im 30/30]
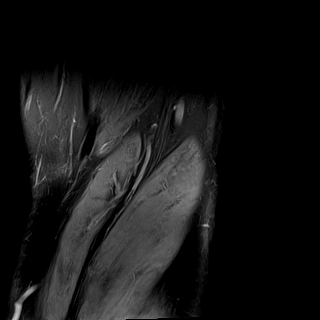

[Series 10: PD fat-sat · sagittal · left · 3.0mm · 0.39mm/px · 6 of 27 slices shown (2 of 2)]
[im 1/27]
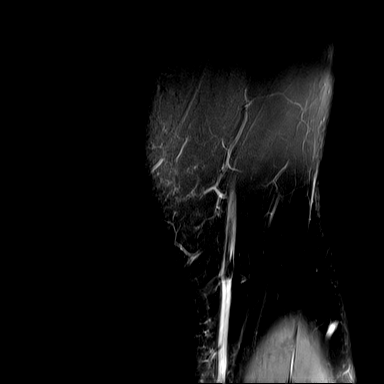
[im 6/27]
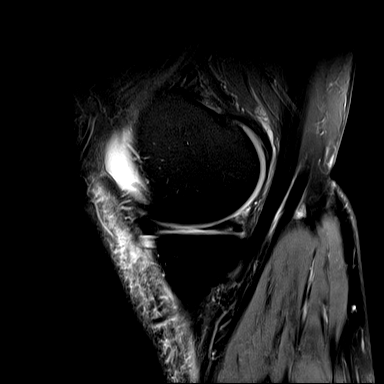
[im 11/27]
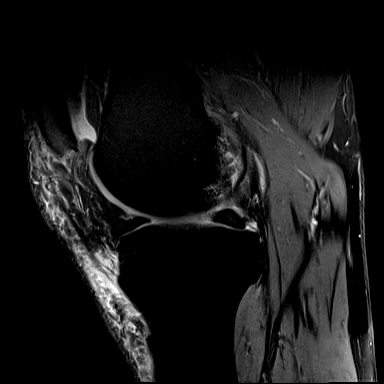
[im 16/27]
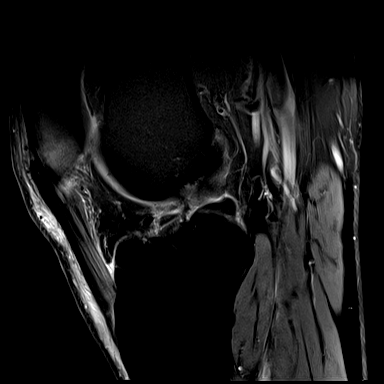
[im 21/27]
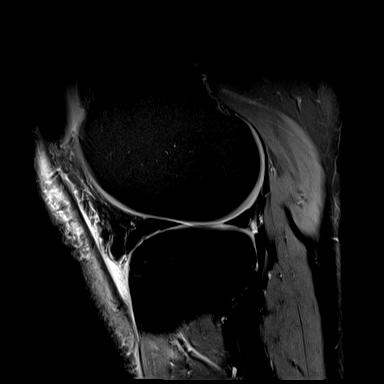
[im 27/27]
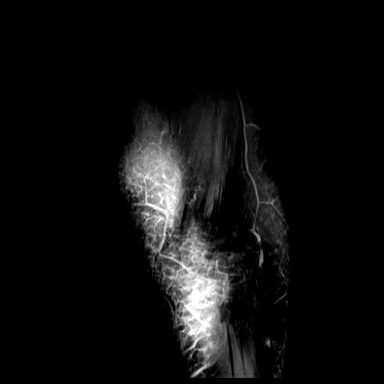

[Series 11: T2 fat-sat · sagittal · left · 3.0mm · 0.39mm/px · 6 of 27 slices shown (3 of 3)]
[im 1/27]
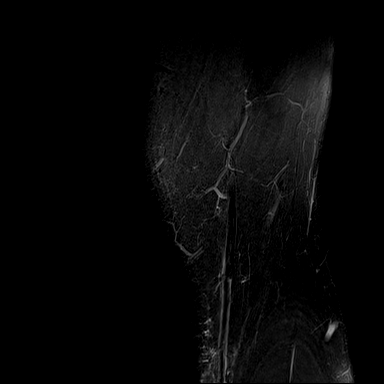
[im 6/27]
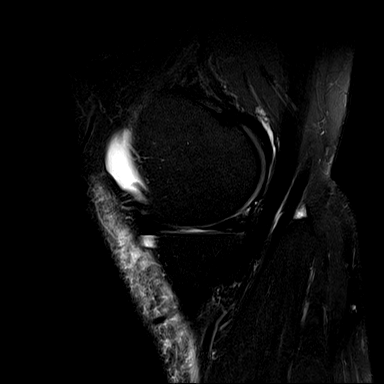
[im 11/27]
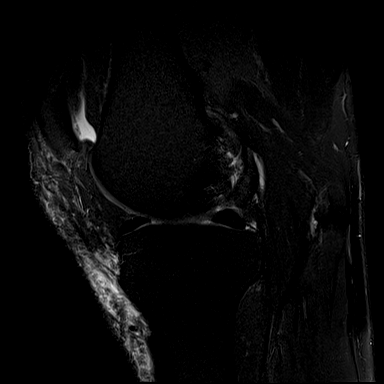
[im 16/27]
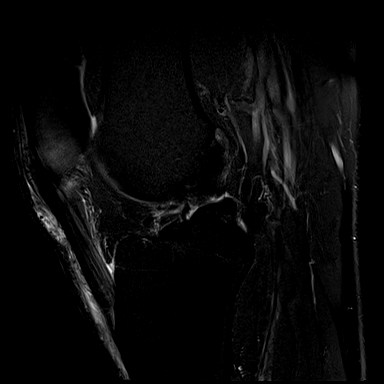
[im 21/27]
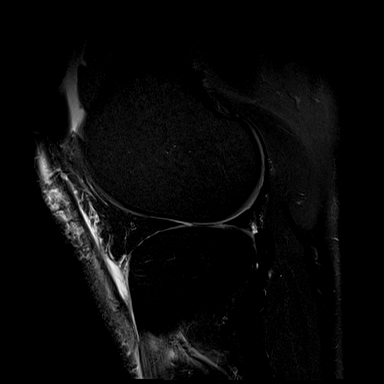
[im 27/27]
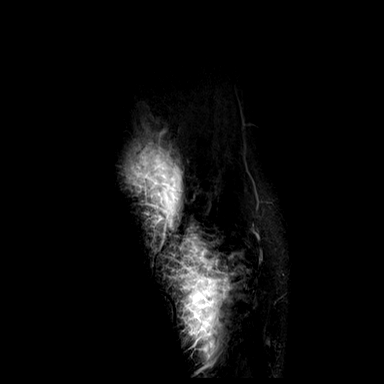

[40 of 40 positions shown; findings below may reference images not displayed]

FINDINGS: MENISCI

Medial meniscus: Horizontal tear at the body/posterior horn
junction. Significant degeneration of the body with extrusion.

Lateral meniscus:  Intact.

LIGAMENTS

Cruciates:  Intact ACL and PCL.

Collaterals: Medial collateral ligament is intact. Lateral
collateral ligament complex is intact.

CARTILAGE

Patellofemoral: High-grade partial-thickness cartilage loss over the
medial patellar facet. Mild partial-thickness cartilage loss and
superficial fissuring over the trochlear groove.

Medial:  No chondral defect.

Lateral: Mild superficial fissuring over the posterior lateral
tibial plateau.

Joint: Small joint effusion. Mild edema in the anterior aspect of
Hoffa's fat.

Popliteal Fossa:  Trace Baker cyst.  Intact popliteus tendon.

Extensor Mechanism: Severe thickening of the patellar tendon with
increased intermediate signal, prominent surrounding edema, and
small amount of fluid in the deep infrapatellar bursa intact
quadriceps tendon. Intact medial and lateral patellar retinaculum.
Intact MPFL.

Bones: No focal marrow signal abnormality. No fracture or
dislocation. No erosions.

Other: None.
IMPRESSION: 1. Horizontal tear at the medial meniscus body/posterior horn
junction. Significant degeneration of the body with extrusion.
2. Severe patellar tendinosis with surrounding soft tissue swelling.
Mild deep infrapatellar bursitis.
3. Mild patellofemoral compartment osteoarthritis.

## 2021-03-21 MED ORDER — PREDNISONE 20 MG PO TABS: 40.0000 mg | ORAL_TABLET | Freq: Every day | ORAL | 0 refills | Status: DC

## 2021-03-28 ENCOUNTER — Encounter: Payer: Self-pay | Admitting: Family Medicine

## 2021-04-02 ENCOUNTER — Ambulatory Visit: Payer: Managed Care, Other (non HMO) | Admitting: Family Medicine

## 2021-04-03 ENCOUNTER — Other Ambulatory Visit: Payer: Self-pay

## 2021-04-03 DIAGNOSIS — G8929 Other chronic pain: Secondary | ICD-10-CM

## 2021-04-03 NOTE — Progress Notes (Signed)
Dakota [name] [place] [address] [place] Phone: [phone] Subjective:   IVilma Meckel, am serving as a scribe for Dr. Hulan Saas. This visit occurred during the SARS-CoV-2 public health emergency.  Safety protocols were in place, including screening questions prior to the visit, additional usage of staff PPE, and extensive cleaning of exam room while observing appropriate contact time as indicated for disinfecting solutions.   I'm seeing this patient by the request  of:  [name], MD  CC: Knee pain, bilateral foot pain  TIR:[id number]  03/11/2021 Patient on aspiration is about appears to be crystals noted.  We will send to lab for further evaluation.  Discussed icing regimen and home exercises.  Discussed different diet choices that I think will be beneficial for any individuals well.  Follow-up with me again in 4 weeks.  Patient has had a past medical history significant for DVT but is not having any difficulty at this time when it comes to that with no significant swelling of the lower extremity.  Update 04/08/2021 Dakota [name] is a 61 y.o. male coming in with complaint of L knee pain. PRP done for ankles on 02/26/2021. Patient states knees felt better quickly and then it got really bad. Patient did have an MRI which was independently visualized by me showing the patient did have medial meniscal tear as well as severe patellar tendinosis.      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   Kidney stone on left side 09/19/2013   Left knee pain 12/25/2012    Mumps as a child   Onychomycosis 09/30/2014   Overweight 12/16/2015   Preventative health care 09/24/2013   Past Surgical History:  Procedure Laterality Date   COLONOSCOPY     COLONOSCOPY WITH PROPOFOL N/A 12/04/2019   Procedure: COLONOSCOPY WITH PROPOFOL;  Surgeon: Irving Copas., MD;  Location: [facility];  Service: Gastroenterology;  Laterality: N/A;  ultra slim scope    CORONARY STENT PLACEMENT  01-14-05   LITHOTRIPSY  2/16   POLYPECTOMY  12/04/2019   Procedure: POLYPECTOMY;  Surgeon: [name], Telford Nab., MD;  Location: [facility];  Service: Gastroenterology;;   Social History   Socioeconomic History   Marital status: Married    Spouse name: Not on file   Number of children: 2   Years of education: Not on file   Highest education level: Not on file  Occupational History   Occupation: real Investment banker, corporate  Tobacco Use   Smoking status: Former    Packs/day: 0.50    Years: 20.00    Pack years: 10.00    Types: Cigarettes    Quit date: 02/20/2015    Years since quitting: 6.1   Smokeless tobacco: Never  Vaping Use   Vaping Use: Every day  Substance and Sexual Activity   Alcohol use: Yes    Comment: 10 drinks per week   Drug use: No   Sexual activity: Yes    Partners: Female  Comment: wife, real Investment banker, corporate, no dairy, minimal fried foods  Other Topics Concern   Not on file  Social History Narrative   1 biological son   1 adopted child   3 step children   Social Determinants of Radio broadcast assistant Strain: Not on file  Food Insecurity: Not on file  Transportation Needs: Not on file  Physical Activity: Not on file  Stress: Not on file  Social Connections: Not on file   Allergies  Allergen Reactions   Neosporin [Neomycin-Bacitracin Zn-Polymyx] Hives and Itching   Polysporin [Bacitracin-Polymyxin B] Hives and Itching   Amoxicillin Itching and Rash   Family History  Problem Relation Age of Onset   Multiple sclerosis Mother     Cancer Father        esophagus/ brain   Alcohol abuse Father        smoker   Esophageal cancer Father    Tremor Father    Seizures Sister        AVM   COPD Sister    Alcohol abuse Sister        addiction, recovered   Tremor Son    Cancer Paternal Aunt        leukemia   Heart disease Maternal Grandfather        MI   Colon cancer Neg Hx    Colon polyps Neg Hx    Rectal cancer Neg Hx    Stomach cancer Neg Hx     Current Outpatient Medications (Endocrine & Metabolic):    predniSONE (DELTASONE) 20 MG tablet, Take 2 tablets (40 mg total) by mouth daily with breakfast.  Current Outpatient Medications (Cardiovascular):    atorvastatin (LIPITOR) 80 MG tablet, TAKE 1 TABLET DAILY AT 6 P.M. (SCHEDULE AN APPOINTMENT FOR FUTURE REFILLS)   ezetimibe (ZETIA) 10 MG tablet, Take 1 tablet (10 mg total) by mouth daily.   losartan (COZAAR) 50 MG tablet, Take 1 tablet (50 mg total) by mouth daily.   metoprolol succinate (TOPROL-XL) 50 MG 24 hr tablet, TAKE 1 TABLET DAILY TAKE WITH OR IMMEDIATELY FOLLOWING A MEAL   nitroGLYCERIN (NITRODUR - DOSED IN MG/24 HR) 0.2 mg/hr patch, Apply 1/4 patch daily to tendon for tendonitis.  Current Outpatient Medications (Respiratory):    cetirizine (ZYRTEC) 10 MG tablet, Take 10 mg by mouth daily as needed for allergies.  Current Outpatient Medications (Analgesics):    allopurinol (ZYLOPRIM) 300 MG tablet, Take 1 tablet (300 mg total) by mouth daily.   aspirin EC 81 MG tablet, Take 1 tablet (81 mg total) by mouth daily. Swallow whole.   indomethacin (INDOCIN) 50 MG capsule, Take 1 capsule (50 mg total) by mouth 2 (two) times daily as needed.   Current Outpatient Medications (Other):    buPROPion (WELLBUTRIN XL) 300 MG 24 hr tablet, Take 1 tablet (300 mg total) by mouth daily.   clobetasol ointment (TEMOVATE) 7.12 %, Apply 1 application topically 2 (two) times daily as needed (psoriasis).   Coenzyme Q10 300 MG CAPS, Take 300 mg by mouth every other day.    lansoprazole (PREVACID) 15 MG capsule, Take 15 mg by mouth daily as needed (acid reflux).   Melatonin 10 MG CAPS, Take 10 mg by mouth at bedtime as needed (sleep).   Multiple Vitamin (MULTIVITAMIN WITH MINERALS) TABS tablet, Take 1 tablet by mouth daily.   mupirocin ointment (BACTROBAN) 2 %, Place 1 application into the nose 2 (two) times daily. (Patient taking differently: Place 1 application into the nose 2 (  two) times daily as needed (wound care).)   rOPINIRole (REQUIP) 0.5 MG tablet, Take 1-2 tablets (0.5-1 mg total) by mouth at bedtime.   tiZANidine (ZANAFLEX) 2 MG tablet, Take 0.5-2 tablets (1-4 mg total) by mouth every 8 (eight) hours as needed for muscle spasms. (Patient not taking: Reported on 02/04/2021)   zolpidem (AMBIEN) 10 MG tablet, Take 0.5-1 tablets (5-10 mg total) by mouth at bedtime as needed. for sleep   Reviewed prior external information including notes and imaging from  primary care provider As well as notes that were available from care everywhere and other healthcare systems.  Past medical history, social, surgical and family history all reviewed in electronic medical record.  No pertanent information unless stated regarding to the chief complaint.   Review of Systems:  No headache, visual changes, nausea, vomiting, diarrhea, constipation, dizziness, abdominal pain, skin rash, fevers, chills, night sweats, weight loss, swollen lymph nodes, body aches, joint swelling, chest pain, shortness of breath, mood changes. POSITIVE muscle aches  Objective  Blood pressure 110/78, pulse 77, height 6' (1.829 m), weight 210 lb (95.3 kg), SpO2 97 %.   General: No apparent distress alert and oriented x3 mood and affect normal, dressed appropriately.  HEENT: Pupils equal, extraocular movements intact  Respiratory: Patient's speak in full sentences and does not appear short of breath  Cardiovascular: No lower extremity edema, non tender, no erythema  Gait normal with good balance and  coordination.  MSK: Patient does have morning tenderness to palpation over the left knee and more anteriorly than the knee itself.  Patient has very mild pain over the medial joint space.  Patient has improvement with the McMurray's test. Foot exam still has some mild pain at the distal aspect of the peroneal tendon at the insertion of the fifth metatarsal.  Does have some mild swelling changes in that area.  Good range of motion of the ankle.  Limited muscular skeletal ultrasound was performed and interpreted by Hulan Saas, M  Limited ultrasound of patient's right ankle and foot shows the patient still has some increase in hypoechoic changes and increasing in Doppler flow in neovascularization in the peroneal tendon.  It does seem little to have improvement from previous exam.  Still some calcific changes that could be secondary to gout.  The patient's left knee ultrasound shows the patient does have still some hypoechoic changes but less effusion noted.  Less displacement of the medial meniscus also noted.  Patient does have increase in neovascularization and hypoechoic changes of the patella tendon proximally and distally.  Mild cortical irregularities noted of the bone in both of these areas as well.     Impression and Recommendations:     The above documentation has been reviewed and is accurate and complete Lyndal Pulley, DO

## 2021-04-08 ENCOUNTER — Ambulatory Visit (INDEPENDENT_AMBULATORY_CARE_PROVIDER_SITE_OTHER): Payer: Managed Care, Other (non HMO) | Admitting: Family Medicine

## 2021-04-08 ENCOUNTER — Other Ambulatory Visit: Payer: Self-pay

## 2021-04-08 ENCOUNTER — Ambulatory Visit: Payer: Self-pay

## 2021-04-08 ENCOUNTER — Encounter: Payer: Self-pay | Admitting: Family Medicine

## 2021-04-08 VITALS — BP 110/78 | HR 77 | Ht 72.0 in | Wt 210.0 lb

## 2021-04-08 DIAGNOSIS — M10062 Idiopathic gout, left knee: Secondary | ICD-10-CM

## 2021-04-08 DIAGNOSIS — M7671 Peroneal tendinitis, right leg: Secondary | ICD-10-CM

## 2021-04-08 DIAGNOSIS — M7652 Patellar tendinitis, left knee: Secondary | ICD-10-CM | POA: Diagnosis not present

## 2021-04-08 DIAGNOSIS — M25462 Effusion, left knee: Secondary | ICD-10-CM

## 2021-04-08 DIAGNOSIS — M7672 Peroneal tendinitis, left leg: Secondary | ICD-10-CM

## 2021-04-08 DIAGNOSIS — M7650 Patellar tendinitis, unspecified knee: Secondary | ICD-10-CM | POA: Insufficient documentation

## 2021-04-08 MED ORDER — ALLOPURINOL 300 MG PO TABS
300.0000 mg | ORAL_TABLET | Freq: Every day | ORAL | 0 refills | Status: DC
Start: 2021-04-08 — End: 2021-10-22

## 2021-04-08 NOTE — Assessment & Plan Note (Signed)
Increase allopurinol to 300 mg. ?

## 2021-04-08 NOTE — Assessment & Plan Note (Addendum)
Severe on the left also does have the medial meniscal tear.  This was seen on the MRI.  Ultrasound today though does show that the hypoechoic changes noted.  We will restart nitroglycerin patches and warned of potential side effects.  I do feel that gout can be playing a role in increase allopurinol.  Follow-up again in 4 to 6 weeks ? ?

## 2021-04-08 NOTE — Assessment & Plan Note (Signed)
Patient is making some improvement.  Still has some hypoechoic changes with increasing activity.  I do feel the patient could actually make some improvement with the neovascularization also noted.  Discussed icing regimen and home exercises, which activities to do and which ones to avoid.  Follow-up again in 6 to 8 weeks ?

## 2021-04-08 NOTE — Patient Instructions (Signed)
Patella strap ?Allopurinol increase to '300mg'$  ?See you again in 6 weeks ?

## 2021-04-29 NOTE — Progress Notes (Signed)
? ? ? ? ?HPI: FU coronary artery disease. 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 Cath revealed normal LM. Left anterior descending artery had 30% lesion in the proximal and mid-vessel. Lcx with 60 % lesion. There were left-to-right collaterals to the RCA. Right coronary artery was dominant and was 100% midvessel occlusion after the takeoff of a medium-sized RV branch; inferobasal wall akinesis. EF was 50%. He underwent PCI of the right coronary artery with bare-metal stent. EF was 50%. Stress echocardiogram in February 2013 was normal. Carotid Dopplers June 2018 showed 1 to 39% bilateral stenosis.  Since last seen, the patient denies any dyspnea on exertion, orthopnea, PND, pedal edema, palpitations, syncope or chest pain. ? ? ?Current Outpatient Medications  ?Medication Sig Dispense Refill  ? allopurinol (ZYLOPRIM) 300 MG tablet Take 1 tablet (300 mg total) by mouth daily. 90 tablet 0  ? aspirin EC 81 MG tablet Take 1 tablet (81 mg total) by mouth daily. Swallow whole. 90 tablet 3  ? atorvastatin (LIPITOR) 80 MG tablet TAKE 1 TABLET DAILY AT 6 P.M. (SCHEDULE AN APPOINTMENT FOR FUTURE REFILLS) 60 tablet 5  ? buPROPion (WELLBUTRIN XL) 300 MG 24 hr tablet Take 1 tablet (300 mg total) by mouth daily. 90 tablet 1  ? cetirizine (ZYRTEC) 10 MG tablet Take 10 mg by mouth daily as needed for allergies.    ? clobetasol ointment (TEMOVATE) 7.32 % Apply 1 application topically 2 (two) times daily as needed (psoriasis). 60 g 2  ? Coenzyme Q10 300 MG CAPS Take 300 mg by mouth every other day.    ? ezetimibe (ZETIA) 10 MG tablet Take 1 tablet (10 mg total) by mouth daily. 90 tablet 3  ? indomethacin (INDOCIN) 50 MG capsule Take 1 capsule (50 mg total) by mouth 2 (two) times daily as needed. 180 capsule 0  ? lansoprazole (PREVACID) 15 MG capsule Take 15 mg by mouth daily as needed (acid reflux).    ? losartan (COZAAR) 50 MG tablet Take 1 tablet (50 mg total) by mouth daily. 90 tablet 3  ? Melatonin 10 MG  CAPS Take 10 mg by mouth at bedtime as needed (sleep).    ? metoprolol succinate (TOPROL-XL) 50 MG 24 hr tablet TAKE 1 TABLET DAILY TAKE WITH OR IMMEDIATELY FOLLOWING A MEAL 90 tablet 3  ? Multiple Vitamin (MULTIVITAMIN WITH MINERALS) TABS tablet Take 1 tablet by mouth daily.    ? mupirocin ointment (BACTROBAN) 2 % Place 1 application into the nose 2 (two) times daily. (Patient taking differently: Place 1 application. into the nose 2 (two) times daily as needed (wound care).) 30 g 1  ? nitroGLYCERIN (NITRODUR - DOSED IN MG/24 HR) 0.2 mg/hr patch Apply 1/4 patch daily to tendon for tendonitis. 30 patch 1  ? rOPINIRole (REQUIP) 0.5 MG tablet Take 1-2 tablets (0.5-1 mg total) by mouth at bedtime. 60 tablet 2  ? zolpidem (AMBIEN) 10 MG tablet Take 0.5-1 tablets (5-10 mg total) by mouth at bedtime as needed. for sleep 30 tablet 5  ? predniSONE (DELTASONE) 20 MG tablet Take 2 tablets (40 mg total) by mouth daily with breakfast. (Patient not taking: Reported on 05/07/2021) 10 tablet 0  ? tiZANidine (ZANAFLEX) 2 MG tablet Take 0.5-2 tablets (1-4 mg total) by mouth every 8 (eight) hours as needed for muscle spasms. (Patient not taking: Reported on 02/04/2021) 40 tablet 2  ? ?No current facility-administered medications for this visit.  ? ? ? ?Past Medical History:  ?Diagnosis Date  ? Allergy   ?  cats  ? Anxiety   ? Asthma   ? childhood  ? CAD (coronary artery disease)   ? Chicken pox as a child  ? Depression 12/25/2012  ? Dysphagia 12/16/2015  ? H/O tobacco use, presenting hazards to health 02/11/2011  ? 1/2 ppd encouraged Has tried nicotine replacements and Chantix in past   ? Hyperlipidemia   ? Hypertension   ? Insomnia 06/11/2013  ? 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 Kidney stone on left side 09/19/2013  ? Left knee pain 12/25/2012  ? Mumps as a child  ? Onychomycosis 09/30/2014  ? Overweight 12/16/2015  ? Preventative health care 09/24/2013  ? ? ?Past Surgical History:  ?Procedure Laterality Date  ? COLONOSCOPY    ?  COLONOSCOPY WITH PROPOFOL N/A 12/04/2019  ? Procedure: COLONOSCOPY WITH PROPOFOL;  Surgeon: [name], Telford Nab., MD;  Location: [facility];  Service: Gastroenterology;  Laterality: N/A;  ultra slim scope   ? CORONARY STENT PLACEMENT  01-14-05  ? LITHOTRIPSY  2/16  ? POLYPECTOMY  12/04/2019  ? Procedure: POLYPECTOMY;  Surgeon: [name], Telford Nab., MD;  Location: [place];  Service: Gastroenterology;;  ? ? ?Social History  ? ?Socioeconomic History  ? Marital status: Married  ?  Spouse name: Not on file  ? Number of children: 2  ? Years of education: Not on file  ? Highest education level: Not on file  ?Occupational History  ? Occupation: Clinical cytogeneticist  ?Tobacco Use  ? Smoking status: Former  ?  Packs/day: 0.50  ?  Years: 20.00  ?  Pack years: 10.00  ?  Types: Cigarettes  ?  Quit date: 02/20/2015  ?  Years since quitting: 6.2  ? Smokeless tobacco: Never  ?Vaping Use  ? Vaping Use: Every day  ?Substance and Sexual Activity  ? Alcohol use: Yes  ?  Comment: 10 drinks per week  ? Drug use: No  ? Sexual activity: Yes  ?  Partners: Female  ?  Comment: wife, real Investment banker, corporate, no dairy, minimal fried foods  ?Other Topics Concern  ? Not on file  ?Social History Narrative  ? 1 biological son  ? 1 adopted child  ? 3 step children  ? ?Social Determinants of Health  ? ?Financial Resource Strain: Not on file  ?Food Insecurity: Not on file  ?Transportation Needs: Not on file  ?Physical Activity: Not on file  ?Stress: Not on file  ?Social Connections: Not on file  ?Intimate Partner Violence: Not on file  ? ? ?Family History  ?Problem Relation Age of Onset  ? Multiple sclerosis Mother   ? Cancer Father   ?     esophagus/ brain  ? Alcohol abuse Father   ?     smoker  ? Esophageal cancer Father   ? Tremor Father   ? Seizures Sister   ?     AVM  ? COPD Sister   ? Alcohol abuse Sister   ?     addiction, recovered  ? Tremor Son   ? Cancer Paternal Aunt   ?     leukemia  ? Heart disease Maternal Grandfather   ?     MI   ? Colon cancer Neg Hx   ? Colon polyps Neg Hx   ? Rectal cancer Neg Hx   ? Stomach cancer Neg Hx   ? ? ?ROS: no fevers or chills, productive cough, hemoptysis, dysphasia, odynophagia, melena, hematochezia, dysuria, hematuria, rash, seizure activity, orthopnea, PND, pedal edema, claudication.  Remaining systems are negative. ? ?Physical Exam: ?Well-developed well-nourished in no acute distress.  ?Skin is warm and dry.  ?HEENT is normal.  ?Neck is supple.  ?Chest is clear to auscultation with normal expansion.  ?Cardiovascular exam is regular rate and rhythm.  ?Abdominal exam nontender or distended. No masses palpated. ?Extremities show no edema. ?neuro grossly intact ? ?ECG-normal sinus rhythm at a rate of 73, no ST changes.  Personally reviewed ? ?A/P ? ?1 coronary artery disease-patient denies recurrent chest pain.  Continue aspirin and statin. ? ?2 carotid artery disease-mild on most recent Dopplers. ? ?3 hypertension-blood pressure controlled today.  Continue present medical regimen. ? ?4 hyperlipidemia-continue statin.  Lipid panel from January 21, 2021 personally reviewed.  LDL 54.  Liver function is normal. ? ?Kirk Ruths, MD ? ? ? ?

## 2021-05-07 ENCOUNTER — Encounter: Payer: Self-pay | Admitting: Cardiology

## 2021-05-07 ENCOUNTER — Ambulatory Visit: Payer: Managed Care, Other (non HMO) | Admitting: Cardiology

## 2021-05-07 VITALS — BP 122/88 | HR 73 | Ht 72.0 in | Wt 213.0 lb

## 2021-05-07 DIAGNOSIS — I10 Essential (primary) hypertension: Secondary | ICD-10-CM

## 2021-05-07 DIAGNOSIS — E7800 Pure hypercholesterolemia, unspecified: Secondary | ICD-10-CM

## 2021-05-07 DIAGNOSIS — I679 Cerebrovascular disease, unspecified: Secondary | ICD-10-CM

## 2021-05-07 DIAGNOSIS — I2510 Atherosclerotic heart disease of native coronary artery without angina pectoris: Secondary | ICD-10-CM | POA: Diagnosis not present

## 2021-05-07 NOTE — Patient Instructions (Signed)

## 2021-05-19 NOTE — Progress Notes (Signed)
?Dakota [name] D.O. ?[facility] ?[place] ?Phone: [phone] ?Subjective:   ?I, Dakota [name], am serving as a scribe for Dr. Hulan Saas. ? ?This visit occurred during the SARS-CoV-2 public health emergency.  Safety protocols were in place, including screening questions prior to the visit, additional usage of staff PPE, and extensive cleaning of exam room while observing appropriate contact time as indicated for disinfecting solutions.  ? ? ?I'm seeing this patient by the request  of:  [name], MD ? ?CC: Foot pain, ankle pain, knee pain follow-up ? ?[id number]  ?04/08/2021 ?Increase allopurinol to 300 mg. ? ?Severe on the left also does have the medial meniscal tear.  This was seen on the MRI.  Ultrasound today though does show that the hypoechoic changes noted.  We will restart nitroglycerin patches and warned of potential side effects.  I do feel that gout can be playing a role in increase allopurinol.  Follow-up again in 4 to 6 weeks ?  ? ?Patient is making some improvement.  Still has some hypoechoic changes with increasing activity.  I do feel the patient could actually make some improvement with the neovascularization also noted.  Discussed icing regimen and home exercises, which activities to do and which ones to avoid.  Follow-up again in 6 to 8 weeks ? ?Update 05/20/2021 ?Dakota [name] is a 61 y.o. male coming in with complaint of L knee and B ankle pain. Patient states that knee is better after getting it drained. Has been trying to modify exercises but feels that he can go back to full exercise program. L achilles stays stiff especially in mornings. Pain in R foot is near base of 5th and can be sharp at times. Overall feels a lot better. Dropped dose from '300mg'$  to '100mg'$  of Allopurinol since he is feeling better. ? ?  ? ?Past Medical History:  ?Diagnosis Date  ? Allergy   ? cats  ? Anxiety   ? Asthma   ? childhood  ? CAD (coronary artery disease)   ?  Chicken pox as a child  ? Depression 12/25/2012  ? Dysphagia 12/16/2015  ? H/O tobacco use, presenting hazards to health 02/11/2011  ? 1/2 ppd encouraged Has tried nicotine replacements and Chantix in past   ? Hyperlipidemia   ? Hypertension   ? Insomnia 06/11/2013  ? 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 Kidney stone on left side 09/19/2013  ? Left knee pain 12/25/2012  ? Mumps as a child  ? Onychomycosis 09/30/2014  ? Overweight 12/16/2015  ? Preventative health care 09/24/2013  ? ?Past Surgical History:  ?Procedure Laterality Date  ? COLONOSCOPY    ? COLONOSCOPY WITH PROPOFOL N/A 12/04/2019  ? Procedure: COLONOSCOPY WITH PROPOFOL;  Surgeon: [name], Telford Nab., MD;  Location: [facility];  Service: Gastroenterology;  Laterality: N/A;  ultra slim scope   ? CORONARY STENT PLACEMENT  01-14-05  ? LITHOTRIPSY  2/16  ? POLYPECTOMY  12/04/2019  ? Procedure: POLYPECTOMY;  Surgeon: [name], Telford Nab., MD;  Location: [facility];  Service: Gastroenterology;;  ? ?Social History  ? ?Socioeconomic History  ? Marital status: Married  ?  Spouse name: Not on file  ? Number of children: 2  ? Years of education: Not on file  ? Highest education level: Not on file  ?Occupational History  ? Occupation: Clinical cytogeneticist  ?Tobacco Use  ? Smoking status: Former  ?  Packs/day: 0.50  ?  Years: 20.00  ?  Pack years: 10.00  ?  Types: Cigarettes  ?  Quit date: 02/20/2015  ?  Years since quitting: 6.2  ? Smokeless tobacco: Never  ?Vaping Use  ? Vaping Use: Every day  ?Substance and Sexual Activity  ? Alcohol use: Yes  ?  Comment: 10 drinks per week  ? Drug use: No  ? Sexual activity: Yes  ?  Partners: Female  ?  Comment: wife, real Investment banker, corporate, no dairy, minimal fried foods  ?Other Topics Concern  ? Not on file  ?Social History Narrative  ? 1 biological son  ? 1 adopted child  ? 3 step children  ? ?Social Determinants of Health  ? ?Financial Resource Strain: Not on file  ?Food Insecurity: Not on file  ?Transportation  Needs: Not on file  ?Physical Activity: Not on file  ?Stress: Not on file  ?Social Connections: Not on file  ? ?Allergies  ?Allergen Reactions  ? Neosporin [Neomycin-Bacitracin Zn-Polymyx] Hives and Itching  ? Polysporin [Bacitracin-Polymyxin B] Hives and Itching  ? Amoxicillin Itching and Rash  ? ?Family History  ?Problem Relation Age of Onset  ? Multiple sclerosis Mother   ? Cancer Father   ?     esophagus/ brain  ? Alcohol abuse Father   ?     smoker  ? Esophageal cancer Father   ? Tremor Father   ? Seizures Sister   ?     AVM  ? COPD Sister   ? Alcohol abuse Sister   ?     addiction, recovered  ? Tremor Son   ? Cancer Paternal Aunt   ?     leukemia  ? Heart disease Maternal Grandfather   ?     MI  ? Colon cancer Neg Hx   ? Colon polyps Neg Hx   ? Rectal cancer Neg Hx   ? Stomach cancer Neg Hx   ? ? ?Current Outpatient Medications (Endocrine & Metabolic):  ?  predniSONE (DELTASONE) 20 MG tablet, Take 2 tablets (40 mg total) by mouth daily with breakfast. ? ?Current Outpatient Medications (Cardiovascular):  ?  atorvastatin (LIPITOR) 80 MG tablet, TAKE 1 TABLET DAILY AT 6 P.M. (SCHEDULE AN APPOINTMENT FOR FUTURE REFILLS) ?  metoprolol succinate (TOPROL-XL) 50 MG 24 hr tablet, TAKE 1 TABLET DAILY TAKE WITH OR IMMEDIATELY FOLLOWING A MEAL ?  nitroGLYCERIN (NITRODUR - DOSED IN MG/24 HR) 0.2 mg/hr patch, Apply 1/4 patch daily to tendon for tendonitis. ?  ezetimibe (ZETIA) 10 MG tablet, Take 1 tablet (10 mg total) by mouth daily. ?  losartan (COZAAR) 50 MG tablet, Take 1 tablet (50 mg total) by mouth daily. ? ?Current Outpatient Medications (Respiratory):  ?  cetirizine (ZYRTEC) 10 MG tablet, Take 10 mg by mouth daily as needed for allergies. ? ?Current Outpatient Medications (Analgesics):  ?  allopurinol (ZYLOPRIM) 300 MG tablet, Take 1 tablet (300 mg total) by mouth daily. ?  aspirin EC 81 MG tablet, Take 1 tablet (81 mg total) by mouth daily. Swallow whole. ?  indomethacin (INDOCIN) 50 MG capsule, Take 1 capsule (50  mg total) by mouth 2 (two) times daily as needed. ? ? ?Current Outpatient Medications (Other):  ?  buPROPion (WELLBUTRIN XL) 300 MG 24 hr tablet, Take 1 tablet (300 mg total) by mouth daily. ?  clobetasol ointment (TEMOVATE) 7.01 %, Apply 1 application topically 2 (two) times daily as needed (psoriasis). ?  Coenzyme Q10 300 MG CAPS, Take 300 mg by mouth every other day. ?  lansoprazole (PREVACID)  15 MG capsule, Take 15 mg by mouth daily as needed (acid reflux). ?  Melatonin 10 MG CAPS, Take 10 mg by mouth at bedtime as needed (sleep). ?  Multiple Vitamin (MULTIVITAMIN WITH MINERALS) TABS tablet, Take 1 tablet by mouth daily. ?  mupirocin ointment (BACTROBAN) 2 %, Place 1 application into the nose 2 (two) times daily. (Patient taking differently: Place 1 application. into the nose 2 (two) times daily as needed (wound care).) ?  rOPINIRole (REQUIP) 0.5 MG tablet, Take 1-2 tablets (0.5-1 mg total) by mouth at bedtime. ?  tiZANidine (ZANAFLEX) 2 MG tablet, Take 0.5-2 tablets (1-4 mg total) by mouth every 8 (eight) hours as needed for muscle spasms. ?  zolpidem (AMBIEN) 10 MG tablet, Take 0.5-1 tablets (5-10 mg total) by mouth at bedtime as needed. for sleep ? ? ?Reviewed prior external information including notes and imaging from  ?primary care provider ?As well as notes that were available from care everywhere and other healthcare systems. ? ?Past medical history, social, surgical and family history all reviewed in electronic medical record.  No pertanent information unless stated regarding to the chief complaint.  ? ?Review of Systems: ? No headache, visual changes, nausea, vomiting, diarrhea, constipation, dizziness, abdominal pain, skin rash, fevers, chills, night sweats, weight loss, swollen lymph nodes, body aches, joint swelling, chest pain, shortness of breath, mood changes. POSITIVE muscle aches ? ?Objective  ?Blood pressure 132/88, pulse 72, height 6' (1.829 m), weight 211 lb (95.7 kg), SpO2 96 %. ?  ?General:  No apparent distress alert and oriented x3 mood and affect normal, dressed appropriately.  ?HEENT: Pupils equal, extraocular movements intact  ?Respiratory: Patient's speak in full sentences and does not a

## 2021-05-20 ENCOUNTER — Ambulatory Visit: Payer: Managed Care, Other (non HMO) | Admitting: Family Medicine

## 2021-05-20 ENCOUNTER — Other Ambulatory Visit: Payer: Self-pay | Admitting: Cardiology

## 2021-05-20 ENCOUNTER — Ambulatory Visit: Payer: Self-pay

## 2021-05-20 ENCOUNTER — Encounter: Payer: Self-pay | Admitting: Family Medicine

## 2021-05-20 VITALS — BP 132/88 | HR 72 | Ht 72.0 in | Wt 211.0 lb

## 2021-05-20 DIAGNOSIS — M25571 Pain in right ankle and joints of right foot: Secondary | ICD-10-CM | POA: Diagnosis not present

## 2021-05-20 DIAGNOSIS — M7671 Peroneal tendinitis, right leg: Secondary | ICD-10-CM | POA: Diagnosis not present

## 2021-05-20 DIAGNOSIS — M25462 Effusion, left knee: Secondary | ICD-10-CM

## 2021-05-20 DIAGNOSIS — M25572 Pain in left ankle and joints of left foot: Secondary | ICD-10-CM | POA: Diagnosis not present

## 2021-05-20 DIAGNOSIS — M7672 Peroneal tendinitis, left leg: Secondary | ICD-10-CM

## 2021-05-20 DIAGNOSIS — M10062 Idiopathic gout, left knee: Secondary | ICD-10-CM | POA: Diagnosis not present

## 2021-05-20 NOTE — Assessment & Plan Note (Signed)
Still mild inflammation noted.  ?Overall the patient is doing relatively well.  He can follow-up with me as needed ?

## 2021-05-20 NOTE — Assessment & Plan Note (Signed)
Patient is going to continue with the allopurinol.  He does not want to take that there is no longer taking milligrams.  Only taking 100 mg.  Follow-up as needed ?

## 2021-05-20 NOTE — Assessment & Plan Note (Signed)
Significant improvement.  Likely secondary to the gout.  No other work-up necessary.  Follow-up as needed ?

## 2021-05-21 MED ORDER — LOSARTAN POTASSIUM 50 MG PO TABS: 50.0000 mg | ORAL_TABLET | Freq: Every day | ORAL | 3 refills | Status: DC

## 2021-05-23 ENCOUNTER — Other Ambulatory Visit: Payer: Self-pay

## 2021-05-23 MED ORDER — BUPROPION HCL ER (XL) 300 MG PO TB24: 300.0000 mg | ORAL_TABLET | Freq: Every day | ORAL | 1 refills | Status: DC

## 2021-07-20 ENCOUNTER — Encounter: Payer: Self-pay | Admitting: Family Medicine

## 2021-07-21 MED ORDER — ZOLPIDEM TARTRATE 10 MG PO TABS: 5.0000 mg | ORAL_TABLET | Freq: Every evening | ORAL | 5 refills | Status: DC | PRN

## 2021-07-21 NOTE — Telephone Encounter (Signed)
Requesting: ambien Contract:01/23/2021 UDS:01/23/21 Last Visit:01/23/21 Next Visit:07/28/21 Last Refill:01/23/21  Please Advise

## 2021-07-28 ENCOUNTER — Encounter: Payer: Self-pay | Admitting: Family Medicine

## 2021-07-28 ENCOUNTER — Ambulatory Visit: Payer: Managed Care, Other (non HMO) | Admitting: Family Medicine

## 2021-07-28 VITALS — BP 121/68 | HR 62 | Ht 72.0 in | Wt 209.4 lb

## 2021-07-28 DIAGNOSIS — E782 Mixed hyperlipidemia: Secondary | ICD-10-CM | POA: Diagnosis not present

## 2021-07-28 DIAGNOSIS — I10 Essential (primary) hypertension: Secondary | ICD-10-CM

## 2021-07-28 DIAGNOSIS — R739 Hyperglycemia, unspecified: Secondary | ICD-10-CM

## 2021-07-28 LAB — LIPID PANEL
Cholesterol: 142 mg/dL (ref 0–200)
HDL: 46.2 mg/dL (ref >39.00)
LDL Cholesterol: 65 mg/dL (ref 0–99)
NonHDL: 95.38
Total CHOL/HDL Ratio: 3
Triglycerides: 154 mg/dL — ABNORMAL HIGH (ref 0.0–149.0)
VLDL: 30.8 mg/dL (ref 0.0–40.0)

## 2021-07-28 LAB — COMPREHENSIVE METABOLIC PANEL
ALT: 31 U/L (ref 0–53)
AST: 29 U/L (ref 0–37)
Albumin: 4.3 g/dL (ref 3.5–5.2)
Alkaline Phosphatase: 89 U/L (ref 39–117)
BUN: 17 mg/dL (ref 6–23)
CO2: 26 mEq/L (ref 19–32)
Calcium: 9.7 mg/dL (ref 8.4–10.5)
Chloride: 100 mEq/L (ref 96–112)
Creatinine, Ser: 1.03 mg/dL (ref 0.40–1.50)
GFR: 78.55 mL/min (ref >60.00)
Glucose, Bld: 130 mg/dL — ABNORMAL HIGH (ref 70–99)
Potassium: 4.6 mEq/L (ref 3.5–5.1)
Sodium: 136 mEq/L (ref 135–145)
Total Bilirubin: 0.6 mg/dL (ref 0.2–1.2)
Total Protein: 7.4 g/dL (ref 6.0–8.3)

## 2021-07-28 LAB — CBC
HCT: 41.2 % (ref 39.0–52.0)
Hemoglobin: 13.2 g/dL (ref 13.0–17.0)
MCHC: 32.1 g/dL (ref 30.0–36.0)
MCV: 87.7 fl (ref 78.0–100.0)
Platelets: 248 10*3/uL (ref 150.0–400.0)
RBC: 4.7 Mil/uL (ref 4.22–5.81)
RDW: 18.1 % — ABNORMAL HIGH (ref 11.5–15.5)
WBC: 7 10*3/uL (ref 4.0–10.5)

## 2021-07-28 LAB — HEMOGLOBIN A1C: Hgb A1c MFr Bld: 6.1 % (ref 4.6–6.5)

## 2021-07-28 LAB — TSH: TSH: 2.17 u[IU]/mL (ref 0.35–5.50)

## 2021-07-28 NOTE — Progress Notes (Signed)
Established Patient Office Visit  Subjective   Patient ID: Dakota [name], male    DOB: Mar 27, 1960  Age: 61 y.o. MRN: [id number]   CC: 91-month routine f/u    HPI  Patient is here for 73-month routine follow-up. He has been doing well overall. Reports a tick bite one month ago; dermatology treated with long course of doxy; still has localized erythema and itching, no systemic symptoms. Still has more doxy to finish. No new symptoms.    HYPERLIPIDEMIA - medications: Lipitor 80 mg daily, Zetia 10 mg daily - compliance: good - medication SEs: none The 10-year ASCVD risk score (Arnett DK, et al., 2019) is: 8.2%   Values used to calculate the score:     Age: 51 years     Sex: Male     Is Non-Hispanic African American: No     Diabetic: No     Tobacco smoker: No     Systolic Blood Pressure: 121 mmHg     Is BP treated: Yes     HDL Cholesterol: 38.4 mg/dL     Total Cholesterol: 131 mg/dL   HYPERTENSION: - Medications: losartan 50 mg daily, metoprolol 50 mg daily,  - Compliance: good - Checking BP at home: rarely - Denies any SOB, recurrent headaches, CP, vision changes, LE edema, dizziness, palpitations, or medication side effects. - Diet: heart healthy - Exercise: gym 2-3 days per week; stationary bike, weight machines    HYPERGLYCEMIA: - Checking BG at home: no ; Last A1c 6.1% (December 2022) - Medications: none - Compliance: working on lifestyle measures - Denies symptoms of hypoglycemia, polyuria, polydipsia, numbness extremities, foot ulcers/trauma, wounds that are not healing, medication side effects          ROS All review of systems negative except what is listed in the HPI    Objective:     BP 121/68   Pulse 62   Ht 6' (1.829 m)   Wt 209 lb 6.4 oz (95 kg)   BMI 28.40 kg/m    Physical Exam Vitals reviewed.  Constitutional:      Appearance: Normal appearance.  HENT:     Head: Normocephalic and atraumatic.  Cardiovascular:     Rate and Rhythm:  Normal rate and regular rhythm.     Pulses: Normal pulses.     Heart sounds: Normal heart sounds.  Pulmonary:     Effort: Pulmonary effort is normal.     Breath sounds: Normal breath sounds.  Musculoskeletal:        General: Normal range of motion.     Cervical back: Normal range of motion and neck supple. No tenderness.  Lymphadenopathy:     Cervical: No cervical adenopathy.  Skin:    General: Skin is warm and dry.     Comments: Scabbed insect bite to right upper back; no significant erythema, induration, warmth, drainage  Neurological:     General: No focal deficit present.     Mental Status: He is alert and oriented to person, place, and time. Mental status is at baseline.  Psychiatric:        Mood and Affect: Mood normal.        Behavior: Behavior normal.        Thought Content: Thought content normal.        Judgment: Judgment normal.        No results found for any visits on 07/28/21.    The 10-year ASCVD risk score (Arnett DK, et al., 2019) is: 8.2%  Assessment & Plan:   Problem List Items Addressed This Visit       Cardiovascular and Mediastinum   Essential hypertension    Blood pressure is at goal for age and co-morbidities.  I recommend continuing current medications: losartan 50 mg daily, metoprolol 50 mg daily   - BP goal <130/80 - monitor and log blood pressures at home - check around the same time each day in a relaxed setting - Limit salt to <2000 mg/day - Follow DASH eating plan (heart healthy diet) - limit alcohol to 2 standard drinks per day for men and 1 per day for women - avoid tobacco products - get at least 2 hours of regular aerobic exercise weekly Patient aware of signs/symptoms requiring further/urgent evaluation. Labs updated today.      Relevant Orders   Hemoglobin A1c   CBC   Comprehensive metabolic panel   Lipid panel   TSH     Other   Hyperlipidemia, mixed    -Reviewed most recent lipid panel -Medication management:  continue current medicines -Repeat labs today  -Diet low in saturated fat -Regular exercise - at least 30 minutes, 5 times per week      Relevant Orders   Comprehensive metabolic panel   Lipid panel   Hyperglycemia - Primary    Well controlled with last A1c 6.1% Continue lifestyle management - low carb diet, regular exercise  F/u in 6 months      Relevant Orders   Hemoglobin A1c   Comprehensive metabolic panel   Lipid panel    Return in about 6 months (around 01/27/2022) for CPE w PCP.    Clayborne Dana, NP

## 2021-07-28 NOTE — Assessment & Plan Note (Signed)
Blood pressure is at goal for age and co-morbidities.  I recommend continuing current medications: losartan 50 mg daily, metoprolol 50 mg daily   - BP goal <130/80 - monitor and log blood pressures at home - check around the same time each day in a relaxed setting - Limit salt to <2000 mg/day - Follow DASH eating plan (heart healthy diet) - limit alcohol to 2 standard drinks per day for men and 1 per day for women - avoid tobacco products - get at least 2 hours of regular aerobic exercise weekly Patient aware of signs/symptoms requiring further/urgent evaluation. Labs updated today.

## 2021-07-28 NOTE — Progress Notes (Signed)
Tick bite right shoulder from about a month ago

## 2021-07-30 ENCOUNTER — Other Ambulatory Visit: Payer: Self-pay | Admitting: Cardiology

## 2021-07-30 ENCOUNTER — Other Ambulatory Visit: Payer: Self-pay | Admitting: Family Medicine

## 2021-08-02 ENCOUNTER — Other Ambulatory Visit: Payer: Self-pay | Admitting: Family Medicine

## 2021-08-31 ENCOUNTER — Encounter: Payer: Self-pay | Admitting: Family Medicine

## 2021-08-31 DIAGNOSIS — L409 Psoriasis, unspecified: Secondary | ICD-10-CM

## 2021-09-01 MED ORDER — METHYLPREDNISOLONE 4 MG PO TABS: ORAL_TABLET | ORAL | 0 refills | Status: DC

## 2021-09-18 ENCOUNTER — Telehealth: Payer: Self-pay | Admitting: Gastroenterology

## 2021-09-18 NOTE — Telephone Encounter (Signed)
Diane, can you please call patient back to schedule direct EGD with dilation in the [facility]. Pt will need a PV appt. Thanks

## 2021-09-18 NOTE — Telephone Encounter (Signed)
Called and spoke with patient. He had an EGD with dilation in 2021. Pt states that his symptoms have returned. He states that in the past 2 weeks he has had food get stuck in his throat twice. Pt is wondering if he needs his esophagus dilated again. Can he be scheduled for direct EGD does he need an OV first? Please advise, thanks.

## 2021-09-18 NOTE — Telephone Encounter (Signed)
Patient called would like to schedule EGD appointment with Dr. Loletha Carrow. Please call to advise.

## 2021-09-18 NOTE — Telephone Encounter (Signed)
Thank you for the note.  I reviewed my August 2021 EGD note, at which time he had a dilation of distal esophageal stricture. I also reviewed his June 26 primary care note, and it sounds like he is in good health.  Mr. Dakota [name] can be directly booked for an EGD/dilation with me in the [facility].  -HD

## 2021-09-24 ENCOUNTER — Encounter: Payer: Self-pay | Admitting: Gastroenterology

## 2021-10-22 ENCOUNTER — Ambulatory Visit (AMBULATORY_SURGERY_CENTER): Payer: Managed Care, Other (non HMO) | Admitting: *Deleted

## 2021-10-22 VITALS — Ht 72.0 in | Wt 208.0 lb

## 2021-10-22 DIAGNOSIS — R4702 Dysphasia: Secondary | ICD-10-CM

## 2021-10-22 NOTE — Progress Notes (Signed)
No egg or soy allergy known to patient  No issues known to pt with past sedation with any surgeries or procedures Patient denies ever being told they had issues or difficulty with intubation  No FH of Malignant Hyperthermia Pt is not on diet pills Pt is not on  home 02  Pt is not on blood thinners  Pt denies issues with constipation  No A fib or A flutter Have any cardiac testing pending--no Pre-visit completed in person Sundra Aland, RN

## 2021-10-23 ENCOUNTER — Encounter: Payer: Self-pay | Admitting: Gastroenterology

## 2021-11-03 HISTORY — PX: FLEXIBLE BRONCHOSCOPY W/ UPPER ENDOSCOPY: SHX1648

## 2021-11-05 ENCOUNTER — Ambulatory Visit: Payer: Managed Care, Other (non HMO) | Admitting: Gastroenterology

## 2021-11-05 ENCOUNTER — Encounter: Payer: Self-pay | Admitting: Gastroenterology

## 2021-11-05 VITALS — BP 122/75 | HR 64 | Temp 98.6°F | Resp 19 | Ht 72.0 in | Wt 200.0 lb

## 2021-11-05 DIAGNOSIS — K222 Esophageal obstruction: Secondary | ICD-10-CM

## 2021-11-05 DIAGNOSIS — K200 Eosinophilic esophagitis: Secondary | ICD-10-CM

## 2021-11-05 DIAGNOSIS — R1319 Other dysphagia: Secondary | ICD-10-CM

## 2021-11-05 DIAGNOSIS — K317 Polyp of stomach and duodenum: Secondary | ICD-10-CM

## 2021-11-05 MED ORDER — SODIUM CHLORIDE 0.9 % IV SOLN: 500.0000 mL | Freq: Once | INTRAVENOUS | Status: DC

## 2021-11-05 NOTE — Patient Instructions (Signed)
Resume previous diet. - Continue present medications. - Await pathology results. - Patient does not have chronic GERD symptoms, but decision re: PPI use pending pathology results. Dakota [name] HAD AN ENDOSCOPIC PROCEDURE TODAY: Refer to the procedure report and other information in the discharge instructions given to you for any specific questions about what was found during the examination. If this information does not answer your questions, please call [place] office at [phone] to clarify.   YOU SHOULD EXPECT: Some feelings of bloating in the abdomen. Passage of more gas than usual. Walking can help get rid of the air that was put into your GI tract during the procedure and reduce the bloating. If you had a lower endoscopy (such as a colonoscopy or flexible sigmoidoscopy) you may notice spotting of blood in your stool or on the toilet paper. Some abdominal soreness may be present for a day or two, also.  DIET: Your first meal following the procedure should be a light meal and then it is ok to progress to your normal diet. A half-sandwich or bowl of soup is an example of a good first meal. Heavy or fried foods are harder to digest and may make you feel nauseous or bloated. Drink plenty of fluids but you should avoid alcoholic beverages for 24 hours. If you had a esophageal dilation, please see attached instructions for diet.    ACTIVITY: Your care partner should take you home directly after the procedure. You should plan to take it easy, moving slowly for the rest of the day. You can resume normal activity the day after the procedure however YOU SHOULD NOT DRIVE, use power tools, machinery or perform tasks that involve climbing or major physical exertion for 24 hours (because of the sedation medicines used during the test).   SYMPTOMS TO REPORT IMMEDIATELY: A gastroenterologist can be reached at any hour. Please call [phone]  for any of the following symptoms:  Following upper endoscopy  (EGD, EUS, ERCP, esophageal dilation) Vomiting of blood or coffee ground material  New, significant abdominal pain  New, significant chest pain or pain under the shoulder blades  Painful or persistently difficult swallowing  New shortness of breath  Black, tarry-looking or red, bloody stools  FOLLOW UP:  If any biopsies were taken you will be contacted by phone or by letter within the next 1-3 weeks. Call [phone]  if you have not heard about the biopsies in 3 weeks.  Please also call with any specific questions about appointments or follow up tests.

## 2021-11-05 NOTE — Progress Notes (Signed)
Pt in recovery with monitors in place, VSS. Report given to receiving RN. Bite guard was placed with pt awake to ensure comfort. No dental or soft tissue damage noted. 

## 2021-11-05 NOTE — Progress Notes (Signed)
Pt's states no medical or surgical changes since previsit or office visit. 

## 2021-11-05 NOTE — Progress Notes (Signed)
History and Physical:  This patient presents for endoscopic testing for: Encounter Diagnosis  Name Primary?   Esophageal dysphagia Yes    61 year old man known to me from previous endoscopic evaluations.  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 He contacted our office last month with slow onset recurrence of dysphagia, and was scheduled for an upper endoscopy and probable dilation today.  Patient is otherwise without complaints or active issues today.   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   Kidney stone on left side 09/19/2013   Left knee pain 12/25/2012   Mumps as a child   Onychomycosis 09/30/2014   Overweight 12/16/2015   Preventative health care 09/24/2013     Past Surgical History: Past Surgical History:  Procedure Laterality Date   COLONOSCOPY     COLONOSCOPY WITH PROPOFOL N/A 12/04/2019   Procedure: COLONOSCOPY WITH PROPOFOL;  Surgeon: Irving Copas., MD;  Location: [facility];  Service: Gastroenterology;  Laterality: N/A;  ultra slim scope    CORONARY STENT PLACEMENT  01/14/2005   LITHOTRIPSY  03/2014   POLYPECTOMY  12/04/2019   Procedure: POLYPECTOMY;  Surgeon: [name], Telford Nab., MD;  Location: [facility];  Service: Gastroenterology;;   UPPER GASTROINTESTINAL ENDOSCOPY  09/12/2019   [facility]    Allergies: Allergies  Allergen Reactions   Neosporin [Neomycin-Bacitracin Zn-Polymyx] Hives and Itching   Polysporin [Bacitracin-Polymyxin B] Hives and Itching   Amoxicillin  Itching and Rash    Outpatient Meds: Current Outpatient Medications  Medication Sig Dispense Refill   allopurinol (ZYLOPRIM) 100 MG tablet TAKE 1 TABLET DAILY 90 tablet 3   aspirin EC 81 MG tablet Take 1 tablet (81 mg total) by mouth daily. Swallow whole. 90 tablet 3   atorvastatin (LIPITOR) 80 MG tablet TAKE 1 TABLET DAILY AT 6 P.M. (SCHEDULE AN APPOINTMENT FOR FUTURE REFILLS) 60 tablet 5   buPROPion (WELLBUTRIN XL) 300 MG 24 hr tablet Take 1 tablet (300 mg total) by mouth daily. 90 tablet 1   ezetimibe (ZETIA) 10 MG tablet Take 1 tablet (10 mg total) by mouth daily. 90 tablet 3   lansoprazole (PREVACID) 15 MG capsule Take 15 mg by mouth daily as needed (acid reflux).     losartan (COZAAR) 50 MG tablet Take 1 tablet (50 mg total) by mouth daily. 90 tablet 3   Melatonin 10 MG CAPS Take 10 mg by mouth at bedtime as needed (sleep).     metoprolol succinate (TOPROL-XL) 50 MG 24 hr tablet Take 1 tablet (50 mg total) by mouth daily. TAKE 1 TABLET DAILY WITH OR IMMEDIATELY FOLLOWING A MEAL 90 tablet 2   Multiple Vitamin (MULTIVITAMIN WITH MINERALS) TABS tablet Take 1 tablet by mouth daily.     zolpidem (AMBIEN) 10 MG tablet Take 0.5-1 tablets (5-10 mg total) by mouth at bedtime as needed. for sleep 30 tablet 5   cetirizine (ZYRTEC) 10 MG tablet Take 10 mg by mouth daily as needed for allergies. (Patient  not taking: Reported on 10/22/2021)     clobetasol ointment (TEMOVATE) 0.93 % Apply 1 application topically 2 (two) times daily as needed (psoriasis). 60 g 2   clotrimazole (LOTRIMIN) 1 % cream Apply topically 2 (two) times daily.     hydrocortisone 2.5 % cream Apply topically 2 (two) times daily.     indomethacin (INDOCIN) 50 MG capsule TAKE 1 CAPSULE TWICE A DAY AS NEEDED 180 capsule 3   Meloxicam 15 MG TBDP Take by mouth as needed.     mupirocin ointment (BACTROBAN) 2 % Place 1 application into the nose 2 (two) times daily. (Patient not taking: Reported on 10/22/2021) 30 g 1   Current  Facility-Administered Medications  Medication Dose Route Frequency Provider Last Rate Last Admin   0.9 %  sodium chloride infusion  500 mL Intravenous Once Nelida Meuse III, MD          ___________________________________________________________________ Objective   Exam:  BP (!) 140/94   Pulse 60   Temp 98.6 F (37 C)   Ht 6' (1.829 m)   Wt 200 lb (90.7 kg)   SpO2 98%   BMI 27.12 kg/m   CV: RRR without murmur, S1/S2 Resp: clear to auscultation bilaterally, normal RR and effort noted GI: soft, no tenderness, with active bowel sounds.   Assessment: Encounter Diagnosis  Name Primary?   Esophageal dysphagia Yes     Plan:  EGD with dilation   The patient is appropriate for an endoscopic procedure in the ambulatory setting.   - Wilfrid Lund, MD

## 2021-11-05 NOTE — Op Note (Signed)
[facility] Patient Name: Dakota [name] Procedure Date: 11/05/2021 9:41 AM MRN: [id number] Endoscopist: Mallie Mussel L. Loletha Carrow , MD Age: 61 Referring MD:  Date of Birth: [birth year] Gender: Male Account #: [id number] Procedure:                Upper GI endoscopy Indications:              Esophageal dysphagia, For therapy of esophageal                            stricture Medicines:                Monitored Anesthesia Care Procedure:                Pre-Anesthesia Assessment:                           - Prior to the procedure, a History and Physical                            was performed, and patient medications and                            allergies were reviewed. The patient's tolerance of                            previous anesthesia was also reviewed. The risks                            and benefits of the procedure and the sedation                            options and risks were discussed with the patient.                            All questions were answered, and informed consent                            was obtained. Prior Anticoagulants: The patient has                            taken no previous anticoagulant or antiplatelet                            agents. ASA Grade Assessment: III - A patient with                            severe systemic disease. After reviewing the risks                            and benefits, the patient was deemed in                            satisfactory condition to undergo the procedure.  After obtaining informed consent, the endoscope was                            passed under direct vision. Throughout the                            procedure, the patient's blood pressure, pulse, and                            oxygen saturations were monitored continuously. The                            Endoscope was introduced through the mouth, and                            advanced to the second part of duodenum. The  upper                            GI endoscopy was accomplished without difficulty.                            The patient tolerated the procedure well. Scope In: Scope Out: Findings:                 Mild mucosal changes characterized by longitudinal                            markings were found in the lower third of the                            esophagus. Several biopsies were obtained in the                            middle third of the esophagus and in the lower                            third of the esophagus with cold forceps for                            histology.                           One benign-appearing, intrinsic moderate stenosis                            was found at the gastroesophageal junction. This                            stenosis measured 1.2 cm (inner diameter) x less                            than one cm (in length). The stenosis was  traversed. The scope was withdrawn. Dilation was                            performed with a Maloney dilator with mild                            resistance at 52 Fr. The dilation site was examined                            following endoscope reinsertion and showed moderate                            mucosal disruption and moderate improvement in                            luminal narrowing.                           A few small sessile fundic gland polyps were found                            in the gastric body.                           The exam of the stomach was otherwise normal.                           The cardia and gastric fundus were normal on                            retroflexion.                           The examined duodenum was normal. Complications:            No immediate complications. Estimated Blood Loss:     Estimated blood loss was minimal. Impression:               - Longitudinally marked mucosa in the esophagus.                           - Benign-appearing esophageal  stenosis. Dilated.                           - A few fundic gland polyps.                           - Normal examined duodenum.                           - Several biopsies were obtained in the middle                            third of the esophagus and in the lower third of  the esophagus. Recommendation:           - Patient has a contact number available for                            emergencies. The signs and symptoms of potential                            delayed complications were discussed with the                            patient. Return to normal activities tomorrow.                            Written discharge instructions were provided to the                            patient.                           - Resume previous diet.                           - Continue present medications.                           - Await pathology results.                           - Patient does not have chronic GERD symptoms, but                            decision re: PPI use pending pathology results. Takashi Korol L. Loletha Carrow, MD 11/05/2021 10:16:04 AM This report has been signed electronically.

## 2021-11-06 ENCOUNTER — Telehealth: Payer: Self-pay

## 2021-11-06 NOTE — Telephone Encounter (Signed)
  Follow up Call-     11/05/2021    9:06 AM 09/12/2019    1:48 PM  Call back number  Post procedure Call Back phone  # [phone]  Permission to leave phone message Yes Yes     Patient questions:  Do you have a fever, pain , or abdominal swelling? No. Pain Score  0 *  Have you tolerated food without any problems? Yes.    Have you been able to return to your normal activities? Yes.    Do you have any questions about your discharge instructions: Diet   No. Medications  No. Follow up visit  No.  Do you have questions or concerns about your Care? No.  Actions: * If pain score is 4 or above: No action needed, pain <4.

## 2021-11-07 ENCOUNTER — Telehealth: Payer: Self-pay | Admitting: Gastroenterology

## 2021-11-07 NOTE — Telephone Encounter (Signed)
Returned call to patient. He states that he noticed BRB in his stool and saw in his instructions that he should call and let us know. I told pt that BRBPR would be more likely after a colonoscopy with polyp removal, not an EGD. Pt states that he did have some biopsies taken of his esophagus. I reassured patient that the BRB is not likely related. I reviewed patient's colonoscopy report from 2021 and he had internal and external hemorrhoids noted. I told pt that he is likely having hemorrhoidal bleeding. Pt states that he will continue to monitor and see if it resolves. I told pt that if his symptoms do not improve he needs to call back so that we can get him scheduled for an appointment. Pt verbalized understanding and had no concerns at the end of the call.

## 2021-11-07 NOTE — Telephone Encounter (Signed)
Inbound call from patient states he is having blood in his stool. Please advise, thank you.

## 2021-11-11 ENCOUNTER — Encounter: Payer: Self-pay | Admitting: Gastroenterology

## 2021-11-14 ENCOUNTER — Other Ambulatory Visit: Payer: Self-pay | Admitting: Gastroenterology

## 2021-11-14 DIAGNOSIS — R12 Heartburn: Secondary | ICD-10-CM

## 2021-11-14 MED ORDER — LANSOPRAZOLE 30 MG PO CPDR: 30.0000 mg | DELAYED_RELEASE_CAPSULE | Freq: Two times a day (BID) | ORAL | 2 refills | Status: DC

## 2021-11-17 MED ORDER — LANSOPRAZOLE 30 MG PO CPDR: 30.0000 mg | DELAYED_RELEASE_CAPSULE | Freq: Two times a day (BID) | ORAL | 2 refills | Status: DC

## 2021-11-17 NOTE — Addendum Note (Signed)
Addended by: Yevette Edwards on: 11/17/2021 10:01 AM   Modules accepted: Orders

## 2021-11-18 ENCOUNTER — Telehealth: Payer: Self-pay | Admitting: Gastroenterology

## 2021-11-18 NOTE — Telephone Encounter (Signed)
Venetia Night from [facility] team call states they need PLA through [email or web address] or through fax # [phone] Lansoprazole 30 mg. Requesting a call back on [phone]. Request it as urgent so it could be process in 3 days.

## 2021-11-20 ENCOUNTER — Other Ambulatory Visit (HOSPITAL_COMMUNITY): Payer: Self-pay

## 2021-11-21 ENCOUNTER — Other Ambulatory Visit (HOSPITAL_COMMUNITY): Payer: Self-pay

## 2021-11-21 NOTE — Telephone Encounter (Signed)
Patient Advocate Encounter  Prior Authorization for Lansoprazole '30MG'$  dr capsules has been approved.    PA# [id number] Key: [id number]  Effective dates: 11/21/2021 through 11/21/2022

## 2021-11-21 NOTE — Telephone Encounter (Signed)
Patient Advocate Encounter   Received notification that prior authorization for Lansoprazole '30MG'$  dr capsules is required/requested.   PA submitted on 11/21/21 to [employer] via CoverMyMeds Key [id number] Status is pending

## 2021-11-25 ENCOUNTER — Other Ambulatory Visit: Payer: Self-pay | Admitting: Family Medicine

## 2021-12-03 ENCOUNTER — Ambulatory Visit: Payer: Managed Care, Other (non HMO) | Admitting: Gastroenterology

## 2021-12-03 ENCOUNTER — Encounter: Payer: Self-pay | Admitting: Gastroenterology

## 2021-12-03 VITALS — BP 138/90 | HR 64 | Ht 70.75 in | Wt 210.0 lb

## 2021-12-03 DIAGNOSIS — K200 Eosinophilic esophagitis: Secondary | ICD-10-CM

## 2021-12-03 DIAGNOSIS — K222 Esophageal obstruction: Secondary | ICD-10-CM

## 2021-12-03 MED ORDER — BUDESONIDE 0.5 MG/2ML IN SUSP: 2.0000 mg | Freq: Every day | RESPIRATORY_TRACT | 1 refills | Status: DC

## 2021-12-03 NOTE — Patient Instructions (Signed)
_______________________________________________________  If you are age 61 or older, your body mass index should be between 23-30. Your Body mass index is 29.5 kg/m. If this is out of the aforementioned range listed, please consider follow up with your Primary Care Provider.  If you are age 23 or younger, your body mass index should be between 19-25. Your Body mass index is 29.5 kg/m. If this is out of the aformentioned range listed, please consider follow up with your Primary Care Provider.   ________________________________________________________  The [facility] providers would like to encourage you to use [facility] to communicate with providers for non-urgent requests or questions.  Due to long hold times on the telephone, sending your provider a message by [facility] may be a faster and more efficient way to get a response.  Please allow 48 business hours for a response.  Please remember that this is for non-urgent requests.  _______________________________________________________  Dakota [name] have been scheduled for an endoscopy. Please follow written instructions given to you at your visit today. If you use inhalers (even only as needed), please bring them with you on the day of your procedure.  It was a pleasure to see you today!  Thank you for trusting me with your gastrointestinal care!

## 2021-12-03 NOTE — Progress Notes (Signed)
[facility] Progress Note  Chief Complaint: Eosinophilic esophagitis and dysphagia  Subjective  History: Dakota [name] follows up after his 11/05/2021 endoscopy.  It was done for recurrent dysphagia after having had it done in August 2021.  On the more recent exam he had a recurrent distal esophageal stricture but significant mucosal changes suggestive of EOE.  (With biopsy results noted below) He was taking Prevacid 15 mg daily, which I then increased to 30 mg twice daily. Today he tells me this increased dose is causing tongue swelling, tooth pain and diarrhea.  (Prior authorization later required and approved)  Dakota [name] tells me his swallowing significantly improved after the recent endoscopic dilation.  He would have heartburn regularly if he was not taking his Prevacid. Appetite good and weight stable.  Denies nausea and vomiting. ROS: Cardiovascular:  no chest pain Respiratory: no dyspnea Remainder of systems negative except as above The patient's Past Medical, Family and Social History were reviewed and are on file in the EMR. 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 Swallow whole., Disp: 90 tablet,  Rfl: 3   atorvastatin (LIPITOR) 80 MG tablet, TAKE 1 TABLET DAILY AT 6 P.M. (SCHEDULE AN APPOINTMENT FOR FUTURE REFILLS), Disp: 60 tablet, Rfl: 5   budesonide (PULMICORT) 0.5 MG/2ML nebulizer solution, Take 8 mLs (2 mg total) by nebulization at bedtime., Disp: 240 mL, Rfl: 1   buPROPion (WELLBUTRIN XL) 300 MG 24 hr tablet, TAKE 1 TABLET DAILY, Disp: 90 tablet, Rfl: 3   cetirizine (ZYRTEC) 10 MG tablet, Take 10 mg by mouth daily as needed for allergies., Disp: , Rfl:    clobetasol ointment (TEMOVATE) 0.81 %, Apply 1 application topically 2 (two) times daily as needed (psoriasis)., Disp: 60 g, Rfl: 2   clotrimazole (LOTRIMIN) 1 % cream, Apply topically 2 (two) times daily., Disp: , Rfl:    ezetimibe (ZETIA) 10 MG tablet, Take 1 tablet (10 mg total) by mouth daily., Disp: 90 tablet, Rfl: 3   hydrocortisone 2.5 % cream, Apply topically 2 (two) times daily., Disp: , Rfl:    indomethacin (INDOCIN) 50 MG capsule, TAKE 1 CAPSULE TWICE A DAY AS NEEDED, Disp: 180 capsule, Rfl: 3   lansoprazole (PREVACID) 30 MG capsule, Take 1 capsule (30 mg total) by mouth 2 (two) times daily before a meal., Disp: 60 capsule, Rfl: 2   losartan (COZAAR) 50 MG tablet, Take 1 tablet (50 mg total) by mouth daily., Disp: 90 tablet, Rfl: 3   Melatonin 10 MG CAPS, Take 10 mg by mouth at bedtime as needed (sleep)., Disp: ,  Rfl:    Meloxicam 15 MG TBDP, Take by mouth as needed., Disp: , Rfl:    metoprolol succinate (TOPROL-XL) 50 MG 24 hr tablet, Take 1 tablet (50 mg total) by mouth daily. TAKE 1 TABLET DAILY WITH OR IMMEDIATELY FOLLOWING A MEAL, Disp: 90 tablet, Rfl: 2   Multiple Vitamin (MULTIVITAMIN WITH MINERALS) TABS tablet, Take 1 tablet by mouth daily., Disp: , Rfl:    mupirocin ointment (BACTROBAN) 2 %, Place 1 application into the nose 2 (two) times daily., Disp: 30 g, Rfl: 1   zolpidem (AMBIEN) 10 MG tablet, Take 0.5-1 tablets (5-10 mg total) by mouth at bedtime as needed. for sleep, Disp: 30 tablet, Rfl: 5   Vital signs  in last 24 hrs: Vitals:   12/03/21 1109  BP: (!) 138/90  Pulse: 64   Wt Readings from Last 3 Encounters:  12/03/21 210 lb (95.3 kg)  11/05/21 200 lb (90.7 kg)  10/22/21 208 lb (94.3 kg)    Physical Exam  Well-appearing. No additional exam, entire visit spent in result review and discussion of plan.  Labs:   ___________________________________________ Radiologic studies:   ____________________________________________ Other: 1. Surgical [P], distal esophagus - SQUAMOUS MUCOSA WITH BASAL CELL HYPERPLASIA AND INCREASED INTRAEPITHELIAL EOSINOPHILS (TOO NUMEROUS TO COUNT), SEE PART 2. 2. Surgical [P], mid esophagus - SQUAMOUS MUCOSA WITH BASAL CELL HYPERPLASIA AND INCREASED INTRAEPITHELIAL EOSINOPHILS FOCALLY TOO NUMEROUS TO COUNT, FINDINGS CONSISTENT WITH EOSINOPHILIC ESOPHAGITIS IN THE APPROPRIATE CLINICAL/ENDOSCOPIC SETTING.  _____________________________________________ Assessment & Plan  Assessment: Encounter Diagnoses  Name Primary?   Eosinophilic esophagitis Yes   Esophageal stricture     Esophageal stricture, dysphagia improved after dilation.  Eosinophilic esophagitis.  This condition was discussed, and he was given additional information and web links and recent portal message.  Unfortunately, current IgE food allergy testing is not helpful for this condition.  The web links I gave him list of the most common allergic food triggers, which would be best to try and avoid as much as possible.  He is having side effects from his high-dose twice daily Prevacid, so we will decrease it to once daily.  If he continues to have the side effects, we will decrease it back down to his previous dose of 15 mg once daily.  We discussed how there is often a reflux overlay to this EOE.  In addition, I am starting him on budesonide slurry, and I prescribed a sufficient quantity for 1 month supply to take 2 mg once nightly.  That will be for ampules of the 0.5 mg / 2 mL dose with a  drop of honey taken at bedtime to help it adhered to the esophageal mucosa.  Nothing to eat or drink for at least 2 hours afterward.  Repeat EGD with esophageal biopsies in about 8 weeks, procedure scheduled.  30 minutes were spent on this encounter (including chart review, history/exam, counseling/coordination of care, and documentation) > 50% of that time was spent on counseling and coordination of care.   Nelida Meuse III

## 2021-12-05 ENCOUNTER — Telehealth: Payer: Self-pay | Admitting: Pharmacy Technician

## 2021-12-05 ENCOUNTER — Other Ambulatory Visit (HOSPITAL_COMMUNITY): Payer: Self-pay

## 2021-12-05 ENCOUNTER — Encounter: Payer: Self-pay | Admitting: Gastroenterology

## 2021-12-05 NOTE — Telephone Encounter (Signed)
PA is required and has been submitted.

## 2021-12-05 NOTE — Telephone Encounter (Signed)
[facility], are we able to see if Budesonide RX needs a PA and how much the co-pay will be for the patient? Thanks

## 2021-12-05 NOTE — Telephone Encounter (Signed)
Patient Advocate Encounter  Received notification from [facility] that prior authorization for BUDESONIDE 0.'5MG'$  is required.   PA submitted on 11.3.23 Key [id number]  Status is pending    Luciano Cutter, CPhT Patient Advocate Phone: [phone]

## 2021-12-08 NOTE — Telephone Encounter (Signed)
Pharmacy Patient Advocate Encounter  Prior Authorization for Budesonide 0.'5MG'$ /2ML has been approved.    PA# [id number] Effective dates: 12/05/2021 through 12/06/2022  Joneen Boers, [place] Patient Advocate Specialist [facility] Patient Advocate Team Direct Number: [phone] Fax: [phone]

## 2021-12-09 ENCOUNTER — Other Ambulatory Visit (HOSPITAL_BASED_OUTPATIENT_CLINIC_OR_DEPARTMENT_OTHER): Payer: Self-pay

## 2021-12-09 MED ORDER — BUDESONIDE 0.5 MG/2ML IN SUSP
2.0000 mg | Freq: Every day | RESPIRATORY_TRACT | 1 refills | Status: DC
  Filled 2021-12-09 – 2021-12-10 (×2): qty 120, 15d supply, fill #0
  Filled 2022-01-28: qty 240, 30d supply, fill #1

## 2021-12-09 NOTE — Addendum Note (Signed)
Addended by: Yevette Edwards on: 12/09/2021 10:46 AM   Modules accepted: Orders

## 2021-12-10 ENCOUNTER — Other Ambulatory Visit (HOSPITAL_BASED_OUTPATIENT_CLINIC_OR_DEPARTMENT_OTHER): Payer: Self-pay

## 2021-12-11 ENCOUNTER — Other Ambulatory Visit (HOSPITAL_BASED_OUTPATIENT_CLINIC_OR_DEPARTMENT_OTHER): Payer: Self-pay

## 2021-12-23 ENCOUNTER — Other Ambulatory Visit: Payer: Self-pay | Admitting: Cardiology

## 2021-12-31 ENCOUNTER — Other Ambulatory Visit (HOSPITAL_BASED_OUTPATIENT_CLINIC_OR_DEPARTMENT_OTHER): Payer: Self-pay

## 2022-01-13 NOTE — Progress Notes (Unsigned)
[facility] [place] Phone: [phone] Subjective:    I'm seeing this patient by the request  of:  [name], MD  CC: Right knee pain  OMV:[id number]  Dakota [name] is a 61 y.o. male coming in with complaint of R knee pain. Seen for L knee pain at beginning of the year. Patient states he thinks he hurt his knee playing pickle ball. Pain is not constant, only if he twists a certain way and the pain will shoot up his leg. Location of pain is medial and lateral patella, states it hurts behind the patella. Patient states still having issues with the left knee which is what he usually sees [place] for.        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   Kidney stone on left side 09/19/2013   Left knee pain 12/25/2012   Mumps as a child   Onychomycosis 09/30/2014   Overweight 12/16/2015   Preventative health care 09/24/2013   Past Surgical History:  Procedure Laterality Date   COLONOSCOPY     COLONOSCOPY WITH PROPOFOL N/A 12/04/2019   Procedure: COLONOSCOPY WITH PROPOFOL;  Surgeon: Irving Copas., MD;  Location: [facility];  Service: Gastroenterology;  Laterality: N/A;  ultra slim scope    CORONARY STENT PLACEMENT  01/14/2005   LITHOTRIPSY  03/2014   POLYPECTOMY  12/04/2019   Procedure: POLYPECTOMY;  Surgeon: [name], Telford Nab., MD;  Location: [facility];  Service: Gastroenterology;;   UPPER GASTROINTESTINAL ENDOSCOPY  09/12/2019   [facility]   Social History   Socioeconomic History   Marital status: Married    Spouse  name: Not on file   Number of children: 2   Years of education: Not on file   Highest education level: Not on file  Occupational History   Occupation: real Investment banker, corporate  Tobacco Use   Smoking status: Former    Packs/day: 0.50    Years: 20.00    Total pack years: 10.00    Types: Cigarettes    Quit date: 02/20/2015    Years since quitting: 6.9   Smokeless tobacco: Never  Vaping Use   Vaping Use: Former  Substance and Sexual Activity   Alcohol use: Yes    Alcohol/week: 2.0 standard drinks of alcohol    Types: 2 Standard drinks or equivalent per week    Comment: 2-3 per week   Drug use: No   Sexual activity: Yes    Partners: Female    Comment: wife, real Investment banker, corporate, no dairy, minimal fried foods  Other Topics Concern   Not on file  Social History Narrative   1 biological son   1 adopted child   3 step children   Social Determinants of Health   Financial Resource Strain: Not on file  Food Insecurity: Not on file  Transportation Needs: Not on file  Physical Activity: Not on file  Stress: Not on file  Social Connections: Not on file   Allergies  Allergen Reactions   Neosporin [Neomycin-Bacitracin Zn-Polymyx] Hives and Itching   Polysporin [Bacitracin-Polymyxin B] Hives and Itching   Amoxicillin Itching and Rash   Family History  Problem Relation Age of Onset   Multiple sclerosis Mother    Cancer Father        esophagus/ brain   Alcohol abuse Father        smoker   Esophageal cancer Father    Tremor Father    Seizures Sister        AVM   COPD Sister    Alcohol abuse Sister        addiction, recovered   Tremor Son    Cancer Paternal Aunt        leukemia   Heart disease Maternal Grandfather        MI   Colon cancer Neg Hx    Colon polyps Neg Hx    Rectal cancer Neg Hx    Stomach cancer Neg Hx      Current Outpatient Medications (Cardiovascular):    atorvastatin (LIPITOR) 80 MG tablet, TAKE 1 TABLET DAILY AT 6 P.M. (SCHEDULE AN APPOINTMENT FOR  FUTURE REFILLS)   ezetimibe (ZETIA) 10 MG tablet, Take 1 tablet (10 mg total) by mouth daily.   losartan (COZAAR) 50 MG tablet, Take 1 tablet (50 mg total) by mouth daily.   metoprolol succinate (TOPROL-XL) 50 MG 24 hr tablet, Take 1 tablet (50 mg total) by mouth daily. TAKE 1 TABLET DAILY WITH OR IMMEDIATELY FOLLOWING A MEAL  Current Outpatient Medications (Respiratory):    budesonide (PULMICORT) 0.5 MG/2ML nebulizer solution, Take 8 mLs (2 mg total) by nebulization at bedtime.   cetirizine (ZYRTEC) 10 MG tablet, Take 10 mg by mouth daily as needed for allergies.  Current Outpatient Medications (Analgesics):    allopurinol (ZYLOPRIM) 100 MG tablet, TAKE 1 TABLET DAILY   aspirin EC 81 MG tablet, Take 1 tablet (81 mg total) by mouth daily. Swallow whole.   indomethacin (INDOCIN) 50 MG capsule, TAKE 1 CAPSULE TWICE A DAY AS NEEDED   Meloxicam 15 MG TBDP, Take by mouth as needed.   Current Outpatient Medications (Other):    buPROPion (WELLBUTRIN XL) 300 MG 24 hr tablet, TAKE 1 TABLET DAILY   clobetasol ointment (TEMOVATE) 9.52 %, Apply 1 application topically 2 (two) times daily as needed (psoriasis).   clotrimazole (LOTRIMIN) 1 % cream, Apply topically 2 (two) times daily.   hydrocortisone 2.5 % cream, Apply topically 2 (two) times daily.   lansoprazole (PREVACID) 30 MG capsule, Take 1 capsule (30 mg total) by mouth 2 (two) times daily before a meal.   Melatonin 10 MG CAPS, Take 10 mg by mouth at bedtime as needed (sleep).   Multiple Vitamin (MULTIVITAMIN WITH MINERALS) TABS tablet, Take 1 tablet by mouth daily.   mupirocin ointment (BACTROBAN) 2 %, Place 1 application into the nose 2 (two) times daily.   zolpidem (AMBIEN) 10 MG tablet, Take 0.5-1 tablets (5-10 mg total) by mouth at bedtime as needed. for sleep   Reviewed prior external information including notes and imaging from  primary care provider As well as notes that were available from care everywhere and other healthcare  systems.  Past medical history, social, surgical and family history all reviewed in electronic medical record.  No pertanent information unless stated regarding to the chief complaint.   Review of Systems:  No headache, visual changes, nausea, vomiting, diarrhea, constipation, dizziness, abdominal pain, skin rash, fevers, chills, night sweats, weight loss, swollen lymph nodes,  body aches, joint swelling, chest pain, shortness of breath, mood changes. POSITIVE muscle aches  Objective  Blood pressure (!) 140/90, pulse 71, height 5' 10.75" (1.797 m), weight 206 lb (93.4 kg), SpO2 97 %.   General: No apparent distress alert and oriented x3 mood and affect normal, dressed appropriately.  HEENT: Pupils equal, extraocular movements intact  Respiratory: Patient's speak in full sentences and does not appear short of breath  Cardiovascular: No lower extremity edema, non tender, no erythema  Patient's right knee does have some mild crepitus noted.  Patient does have some lateral tracking of the wound noted.  Negative McMurray's noted.  No significant instability noted      Impression and Recommendations:     The above documentation has been reviewed and is accurate and complete Lyndal Pulley, DO

## 2022-01-15 ENCOUNTER — Ambulatory Visit: Payer: Managed Care, Other (non HMO)

## 2022-01-15 ENCOUNTER — Other Ambulatory Visit: Payer: Self-pay | Admitting: Family Medicine

## 2022-01-15 ENCOUNTER — Ambulatory Visit: Payer: Managed Care, Other (non HMO) | Admitting: Family Medicine

## 2022-01-15 VITALS — BP 140/90 | HR 71 | Ht 70.75 in | Wt 206.0 lb

## 2022-01-15 DIAGNOSIS — M222X1 Patellofemoral disorders, right knee: Secondary | ICD-10-CM | POA: Insufficient documentation

## 2022-01-15 DIAGNOSIS — M25561 Pain in right knee: Secondary | ICD-10-CM | POA: Diagnosis not present

## 2022-01-15 NOTE — Patient Instructions (Signed)
Good to see you  Right sided tru pull lite Exercises for patella femoral given Right knee Xray on your way out  Follow up in 6-8 weeks

## 2022-01-15 NOTE — Assessment & Plan Note (Signed)
Reviewed anatomy using anatomical model and how PFS occurs.  Given rehab exercises handout for VMO, hip abductors, core, entire kinetic chain including proprioception exercises.  Could benefit from PT, regular exercise, upright biking, and a PFS knee brace to assist with tracking abnormalities. Discussed that if worsening pain will consider potential injections.  Tru pull lite brace incorporating care as well.  Follow-up again in 6 to 8 weeks

## 2022-01-20 ENCOUNTER — Other Ambulatory Visit: Payer: Self-pay | Admitting: Family Medicine

## 2022-01-20 NOTE — Telephone Encounter (Signed)
Requesting: Ambien '10mg'$   Contract:01/23/2021 UDS: 12/13/2018 Last Visit: 07/28/21 Next Visit: 01/29/22 Last Refill: 07/21/21 #30 and 5RF  Please Advise

## 2022-01-21 ENCOUNTER — Encounter: Payer: Self-pay | Admitting: Certified Registered Nurse Anesthetist

## 2022-01-22 ENCOUNTER — Ambulatory Visit: Payer: Managed Care, Other (non HMO) | Admitting: Gastroenterology

## 2022-01-22 ENCOUNTER — Encounter: Payer: Self-pay | Admitting: Gastroenterology

## 2022-01-22 VITALS — BP 119/67 | HR 70 | Temp 96.9°F | Resp 17 | Ht 70.0 in | Wt 210.0 lb

## 2022-01-22 DIAGNOSIS — K200 Eosinophilic esophagitis: Secondary | ICD-10-CM | POA: Diagnosis present

## 2022-01-22 DIAGNOSIS — K2289 Other specified disease of esophagus: Secondary | ICD-10-CM | POA: Diagnosis not present

## 2022-01-22 DIAGNOSIS — K222 Esophageal obstruction: Secondary | ICD-10-CM

## 2022-01-22 MED ORDER — SODIUM CHLORIDE 0.9 % IV SOLN: 500.0000 mL | Freq: Once | INTRAVENOUS | Status: DC

## 2022-01-22 NOTE — Progress Notes (Signed)
Report given to PACU, vss 

## 2022-01-22 NOTE — Progress Notes (Signed)
0827 Robinul 0.1 mg IV given due large amount of secretions upon assessment.  MD made aware, vss

## 2022-01-22 NOTE — Patient Instructions (Signed)
Resume previous diet and medications. Awaiting pathology results.  YOU HAD AN ENDOSCOPIC PROCEDURE TODAY AT [facility]:   Refer to the procedure report that was given to you for any specific questions about what was found during the examination.  If the procedure report does not answer your questions, please call your gastroenterologist to clarify.  If you requested that your care partner not be given the details of your procedure findings, then the procedure report has been included in a sealed envelope for you to review at your convenience later.  YOU SHOULD EXPECT: Some feelings of bloating in the abdomen. Passage of more gas than usual.  Walking can help get rid of the air that was put into your GI tract during the procedure and reduce the bloating. If you had a lower endoscopy (such as a colonoscopy or flexible sigmoidoscopy) you may notice spotting of blood in your stool or on the toilet paper. If you underwent a bowel prep for your procedure, you may not have a normal bowel movement for a few days.  Please Note:  You might notice some irritation and congestion in your nose or some drainage.  This is from the oxygen used during your procedure.  There is no need for concern and it should clear up in a day or so.  SYMPTOMS TO REPORT IMMEDIATELY:  Following upper endoscopy (EGD)  Vomiting of blood or coffee ground material  New chest pain or pain under the shoulder blades  Painful or persistently difficult swallowing  New shortness of breath  Fever of 100F or higher  Black, tarry-looking stools  For urgent or emergent issues, a gastroenterologist can be reached at any hour by calling [phone]. Do not use MyChart messaging for urgent concerns.    DIET:  We do recommend a small meal at first, but then you may proceed to your regular diet.  Drink plenty of fluids but you should avoid alcoholic beverages for 24 hours.  ACTIVITY:  You should plan to take it easy for the  rest of today and you should NOT DRIVE or use heavy machinery until tomorrow (because of the sedation medicines used during the test).    FOLLOW UP: Our staff will call the number listed on your records the next business day following your procedure.  We will call around 7:15- 8:00 am to check on you and address any questions or concerns that you may have regarding the information given to you following your procedure. If we do not reach you, we will leave a message.     If any biopsies were taken you will be contacted by phone or by letter within the next 1-3 weeks.  Please call us at [phone] if you have not heard about the biopsies in 3 weeks.    SIGNATURES/CONFIDENTIALITY: You and/or your care partner have signed paperwork which will be entered into your electronic medical record.  These signatures attest to the fact that that the information above on your After Visit Summary has been reviewed and is understood.  Full responsibility of the confidentiality of this discharge information lies with you and/or your care-partner.

## 2022-01-22 NOTE — Progress Notes (Signed)
History and Physical:  This patient presents for endoscopic testing for: Encounter Diagnoses  Name Primary?   Eosinophilic esophagitis Yes   Esophageal stricture     61 year old man here for endoscopic reevaluation of eosinophilic esophagitis. Clinical details in my office progress note of 12/03/2021. Since then, he has been on budesonide slurry in addition to his once daily Prevacid.  (Twice daily Prevacid caused side effects) He reports feeling well without recurrence of dysphagia, and says he does not seem to be having any difficulty or side effects from the nightly budesonide slurry. Patient is otherwise without complaints or active issues today.   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   Kidney stone on left side 09/19/2013   Left knee pain 12/25/2012   Mumps as a child   Onychomycosis 09/30/2014   Overweight 12/16/2015   Preventative health care 09/24/2013     Past Surgical History: Past Surgical History:  Procedure Laterality Date   COLONOSCOPY     COLONOSCOPY WITH PROPOFOL N/A 12/04/2019   Procedure: COLONOSCOPY WITH PROPOFOL;  Surgeon: Irving Copas., MD;  Location: [place];  Service: Gastroenterology;  Laterality: N/A;  ultra slim scope    CORONARY STENT PLACEMENT  01/14/2005   LITHOTRIPSY  03/2014   POLYPECTOMY  12/04/2019   Procedure: POLYPECTOMY;  Surgeon: [name], Telford Nab., MD;  Location: [place];  Service: Gastroenterology;;   UPPER GASTROINTESTINAL ENDOSCOPY  09/12/2019   Danis     Allergies: Allergies  Allergen Reactions   Neosporin [Neomycin-Bacitracin Zn-Polymyx] Hives and Itching   Polysporin [Bacitracin-Polymyxin B] Hives and Itching   Amoxicillin Itching and Rash    Outpatient Meds: Current Outpatient Medications  Medication Sig Dispense Refill   allopurinol (ZYLOPRIM) 100 MG tablet TAKE 1 TABLET DAILY 90 tablet 3   aspirin EC 81 MG tablet Take 1 tablet (81 mg total) by mouth daily. Swallow whole. 90 tablet 3   atorvastatin (LIPITOR) 80 MG tablet TAKE 1 TABLET DAILY AT 6 P.M. (SCHEDULE AN APPOINTMENT FOR FUTURE REFILLS) 60 tablet 5   budesonide (PULMICORT) 0.5 MG/2ML nebulizer solution Take 8 mLs (2 mg total) by nebulization at bedtime. 240 mL 1   buPROPion (WELLBUTRIN XL) 300 MG 24 hr tablet TAKE 1 TABLET DAILY 90 tablet 3   ezetimibe (ZETIA) 10 MG tablet Take 1 tablet (10 mg total) by mouth daily. 90 tablet 3   indomethacin (INDOCIN) 50 MG capsule TAKE 1 CAPSULE TWICE A DAY AS NEEDED 180 capsule 3   lansoprazole (PREVACID) 30 MG capsule Take 1 capsule (30 mg total) by mouth 2 (two) times daily before a meal. 60 capsule 2   losartan (COZAAR) 50 MG tablet Take 1 tablet (50 mg total) by mouth daily. 90 tablet 3   Melatonin 10 MG CAPS Take 10 mg by mouth at bedtime as needed (sleep).     metoprolol succinate (TOPROL-XL) 50 MG 24 hr tablet Take 1 tablet (50 mg total) by mouth daily. TAKE 1 TABLET DAILY  WITH OR IMMEDIATELY FOLLOWING A MEAL 90 tablet 2   Multiple Vitamin (MULTIVITAMIN WITH MINERALS) TABS tablet Take 1 tablet by mouth daily.     zolpidem (AMBIEN) 10 MG tablet TAKE 1/2 TO 1 TABLET(5 TO 10 MG) BY MOUTH AT BEDTIME AS NEEDED FOR SLEEP 30 tablet 1   cetirizine (ZYRTEC) 10 MG tablet Take 10 mg by mouth daily as needed for allergies.     clobetasol ointment (TEMOVATE) 6.06 % Apply 1 application topically 2 (two) times daily as needed (psoriasis). 60 g 2   clotrimazole (LOTRIMIN) 1 % cream Apply topically 2 (two) times daily.     hydrocortisone 2.5 %  cream Apply topically 2 (two) times daily.     Meloxicam 15 MG TBDP Take by mouth as needed.     mupirocin ointment (BACTROBAN) 2 % Place 1 application into the nose 2 (two) times daily. (Patient not taking: Reported on 01/22/2022) 30 g 1   Current Facility-Administered Medications  Medication Dose Route Frequency Provider Last Rate Last Admin   0.9 %  sodium chloride infusion  500 mL Intravenous Once Danis, Estill Cotta III, MD          ___________________________________________________________________ Objective   Exam:  BP 134/81   Pulse 60   Temp (!) 96.9 F (36.1 C) (Temporal)   Ht '5\' 10"'$  (1.778 m)   Wt 210 lb (95.3 kg)   SpO2 97%   BMI 30.13 kg/m   CV: regular , S1/S2 Resp: clear to auscultation bilaterally, normal RR and effort noted GI: soft, no tenderness, with active bowel sounds.   Assessment: Encounter Diagnoses  Name Primary?   Eosinophilic esophagitis Yes   Esophageal stricture      Plan:  EGD  The benefits and risks of the planned procedure were described in detail with the patient or (when appropriate) their health care proxy.  Risks were outlined as including, but not limited to, bleeding, infection, perforation, adverse medication reaction leading to cardiac or pulmonary decompensation, pancreatitis (if ERCP).  The limitation of incomplete mucosal visualization was also discussed.  No guarantees or warranties were given.    The patient is appropriate for an endoscopic procedure in the ambulatory setting.   - Wilfrid Lund, MD

## 2022-01-22 NOTE — Op Note (Signed)
[facility] Patient Name: Dakota [name] Procedure Date: 01/22/2022 8:17 AM MRN: [id number] Endoscopist: Zap. Loletha Carrow , MD, [id number] Age: 61 Referring MD:  Date of Birth: Nov 07, 1960 Gender: Male Account #: [id number] Procedure:                Upper GI endoscopy Indications:              Follow-up of eosinophilic esophagitis                           Established EOE with insufficient symptom control                            and recurrent stricture on once daily PPI (twice                            daily PPI causes side effects).                           Patient began nightly use of budesonide slurry                            approximately 6 weeks ago and reports no recurrence                            of dysphagia so far. Medicines:                Monitored Anesthesia Care Procedure:                Pre-Anesthesia Assessment:                           - Prior to the procedure, a History and Physical                            was performed, and patient medications and                            allergies were reviewed. The patient's tolerance of                            previous anesthesia was also reviewed. The risks                            and benefits of the procedure and the sedation                            options and risks were discussed with the patient.                            All questions were answered, and informed consent                            was obtained. Prior Anticoagulants: The patient has  taken no anticoagulant or antiplatelet agents. ASA                            Grade Assessment: III - A patient with severe                            systemic disease. After reviewing the risks and                            benefits, the patient was deemed in satisfactory                            condition to undergo the procedure.                           After obtaining informed consent, the endoscope was                             passed under direct vision. Throughout the                            procedure, the patient's blood pressure, pulse, and                            oxygen saturations were monitored continuously. The                            GIF [id number] #[id number] was introduced through the                            mouth, and advanced to the second part of duodenum.                            The upper GI endoscopy was accomplished without                            difficulty. The patient tolerated the procedure                            well. Scope In: Scope Out: Findings:                 The larynx was normal.                           Mucosal changes including longitudinal furrows were                            found in the esophagus. Esophageal findings were                            graded using the Eosinophilic Esophagitis                            Endoscopic Reference Score (EoE-EREFS) as: Edema  Grade 0 Normal (distinct vascular markings), Rings                            Grade 1 Mild (subtle circumferential ridges seen on                            esophageal distension), Exudates Grade 0 None (no                            white lesions seen), Furrows Grade 1 Mild (vertical                            lines without visible depth) and Stricture none (no                            stricture found). Several biopsies were obtained in                            the middle third of the esophagus and in the lower                            third of the esophagus with cold forceps for                            evaluation of eosinophilic esophagitis.                           These findings are visibly less prominent than                            those on the most recent EGD.                           A few small erosions were found in the lower third                            of the esophagus.                           The exam of the esophagus was  otherwise normal.                           The stomach was normal.                           The cardia and gastric fundus were normal on                            retroflexion.                           The examined duodenum was normal. Complications:            No immediate complications. Estimated Blood Loss:     Estimated blood loss was minimal.  Impression:               - Normal larynx.                           - Esophageal mucosal changes secondary to                            eosinophilic esophagitis.                           - A few erosions in the lower third of the                            esophagus.                           - Normal stomach.                           - Normal examined duodenum.                           - Several biopsies were obtained in the middle                            third of the esophagus and in the lower third of                            the esophagus. Recommendation:           - Patient has a contact number available for                            emergencies. The signs and symptoms of potential                            delayed complications were discussed with the                            patient. Return to normal activities tomorrow.                            Written discharge instructions were provided to the                            patient.                           - Resume previous diet.                           - Continue present medications.                           - Await pathology results.                           -  Further recommendations regarding treatment and                            follow-up pending pathology results. Ashunti Schofield L. Loletha Carrow, MD 01/22/2022 8:47:09 AM This report has been signed electronically.

## 2022-01-22 NOTE — Progress Notes (Signed)
Called to room to assist during endoscopic procedure.  Patient ID and intended procedure confirmed with present staff. Received instructions for my participation in the procedure from the performing physician.  

## 2022-01-23 ENCOUNTER — Telehealth: Payer: Self-pay

## 2022-01-23 NOTE — Telephone Encounter (Signed)
  Follow up Call-     01/22/2022    7:50 AM 11/05/2021    9:06 AM 09/12/2019    1:48 PM  Call back number  Post procedure Call Back phone  # [phone]  Permission to leave phone message Yes Yes Yes     Patient questions:  Do you have a fever, pain , or abdominal swelling? No. Pain Score  0 *  Have you tolerated food without any problems? Yes.    Have you been able to return to your normal activities? Yes.    Do you have any questions about your discharge instructions: Diet   No. Medications  No. Follow up visit  No.  Do you have questions or concerns about your Care? No.  Actions: * If pain score is 4 or above: No action needed, pain <4.

## 2022-01-28 ENCOUNTER — Other Ambulatory Visit (HOSPITAL_BASED_OUTPATIENT_CLINIC_OR_DEPARTMENT_OTHER): Payer: Self-pay

## 2022-01-28 NOTE — Assessment & Plan Note (Signed)
Tolerating statin, encouraged heart healthy diet, avoid trans fats, minimize simple carbs and saturated fats. Increase exercise as tolerated tolerating Atorvastatin

## 2022-01-28 NOTE — Assessment & Plan Note (Signed)
hgba1c acceptable, minimize simple carbs. Increase exercise as tolerated.  

## 2022-01-28 NOTE — Assessment & Plan Note (Signed)
Hydrate and monitor 

## 2022-01-28 NOTE — Assessment & Plan Note (Signed)
Well controlled, no changes to meds. Encouraged heart healthy diet such as the DASH diet and exercise as tolerated.  °

## 2022-01-29 ENCOUNTER — Ambulatory Visit: Payer: Managed Care, Other (non HMO) | Admitting: Family Medicine

## 2022-01-29 ENCOUNTER — Encounter: Payer: Self-pay | Admitting: Family Medicine

## 2022-01-29 ENCOUNTER — Encounter: Payer: Self-pay | Admitting: Gastroenterology

## 2022-01-29 VITALS — BP 148/90 | HR 85 | Temp 100.5°F | Resp 16 | Ht 70.0 in | Wt 204.0 lb

## 2022-01-29 DIAGNOSIS — E782 Mixed hyperlipidemia: Secondary | ICD-10-CM

## 2022-01-29 DIAGNOSIS — Z0001 Encounter for general adult medical examination with abnormal findings: Secondary | ICD-10-CM

## 2022-01-29 DIAGNOSIS — I10 Essential (primary) hypertension: Secondary | ICD-10-CM

## 2022-01-29 DIAGNOSIS — R351 Nocturia: Secondary | ICD-10-CM | POA: Diagnosis not present

## 2022-01-29 DIAGNOSIS — Z0000 Encounter for general adult medical examination without abnormal findings: Secondary | ICD-10-CM

## 2022-01-29 DIAGNOSIS — M10062 Idiopathic gout, left knee: Secondary | ICD-10-CM

## 2022-01-29 DIAGNOSIS — K200 Eosinophilic esophagitis: Secondary | ICD-10-CM

## 2022-01-29 DIAGNOSIS — R509 Fever, unspecified: Secondary | ICD-10-CM

## 2022-01-29 DIAGNOSIS — R739 Hyperglycemia, unspecified: Secondary | ICD-10-CM

## 2022-01-29 LAB — POCT INFLUENZA A/B
Influenza A, POC: NEGATIVE
Influenza B, POC: NEGATIVE

## 2022-01-29 LAB — POC COVID19 BINAXNOW: SARS Coronavirus 2 Ag: NEGATIVE

## 2022-01-29 MED ORDER — PROMETHAZINE-DM 6.25-15 MG/5ML PO SYRP: 2.5000 mL | ORAL_SOLUTION | Freq: Three times a day (TID) | ORAL | 0 refills | Status: DC | PRN

## 2022-01-29 NOTE — Progress Notes (Addendum)
Subjective:   By signing my name below, I, Dakota [name], attest that this documentation has been prepared under the direction and in the presence of Dakota Lukes, MD., 01/29/2022.     Patient ID: Dakota [name], male    DOB: 09/21/1960, 61 y.o.   MRN: [id number]  Chief Complaint  Patient presents with   Annual Exam   HPI Patient is in today for a comprehensive physical exam and follow up on chronic medical concerns. He denies CP/palpitations/SOB/ fevers/chills/ear pain/GI or GU c/o.  Congestion/Cough Patient presents today with congestion/ cough/headaches/myalgias/yellow phlegm production/sore throat that started last night. He reports that he was at the gym when he noticed that he felt ill. He does not have an appetite and has not eaten today. He has tested negative for COVID-19 and Influenza.  EOE He was seen by his gastroenterologist Dr. Loletha [name] on 01/22/2022 who diagnosed him with Eosinophilic Esophagitis. He is currently taking Budesonide 0.5 mg/2 mL liquid nightly.  Family History No changes to the family history.  Hepatitis B He reports that he frequently donates blood but his most recent blood test tested positive for Hepatitis B. However, further testing was negative so he is requesting that blood work today to recheck this.  Hypertension He currently takes Metoprolol Succinate 50 mg each morning and Losartan 50 mg each afternoon to manage his hypertension. He has an appointment with Dr. Stanford Breed next month. BP Readings from Last 3 Encounters:  01/29/22 (!) 148/90  01/22/22 119/67  01/15/22 (!) 140/90   Pulse Readings from Last 3 Encounters:  01/29/22 85  01/22/22 70  01/15/22 71   Past Medical History:  Diagnosis Date   Allergy    cats   Anxiety    Asthma    childhood   CAD (coronary artery disease)    Chicken pox as a child   Depression 12/25/2012   Dysphagia 12/16/2015   GERD (gastroesophageal reflux disease)    H/O tobacco use, presenting hazards to  health 02/11/2011   1/2 ppd encouraged Has tried nicotine replacements and Chantix in past    Hyperlipidemia    Hypertension    Insomnia 06/11/2013   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   Kidney stone on left side 09/19/2013   Left knee pain 12/25/2012   Mumps as a child   Onychomycosis 09/30/2014   Overweight 12/16/2015   Preventative health care 09/24/2013    Past Surgical History:  Procedure Laterality Date   COLONOSCOPY     COLONOSCOPY WITH PROPOFOL N/A 12/04/2019   Procedure: COLONOSCOPY WITH PROPOFOL;  Surgeon: Dakota [name]., MD;  Location: [facility];  Service: Gastroenterology;  Laterality: N/A;  ultra slim scope    CORONARY STENT PLACEMENT  01/14/2005   FLEXIBLE BRONCHOSCOPY W/ UPPER ENDOSCOPY  11/03/2021   LITHOTRIPSY  03/2014   POLYPECTOMY  12/04/2019   Procedure: POLYPECTOMY;  Surgeon: [name], Telford Nab., MD;  Location: [facility] ENDOSCOPY;  Service: Gastroenterology;;   UPPER GASTROINTESTINAL ENDOSCOPY  09/12/2019   Dakota [name]    Family History  Problem Relation Age of Onset   Multiple sclerosis Mother    Cancer Father        esophagus/ brain   Alcohol abuse Father        smoker   Esophageal cancer Father    Tremor Father    Seizures Sister        AVM   COPD Sister    Alcohol abuse Sister        addiction, recovered  Tremor Son    Cancer Paternal Aunt        leukemia   Heart disease Maternal Grandfather        MI   Colon cancer Neg Hx    Colon polyps Neg Hx    Rectal cancer Neg Hx    Stomach cancer Neg Hx     Social History   Socioeconomic History   Marital status: Married    Spouse name: Not on file   Number of children: 2   Years of education: Not on file   Highest education level: Not on file  Occupational History   Occupation: real Investment banker, corporate  Tobacco Use   Smoking status: Former    Packs/day: 0.50    Years: 20.00    Total pack years: 10.00    Types: Cigarettes    Quit date: 02/20/2015    Years since quitting:  6.9   Smokeless tobacco: Never  Vaping Use   Vaping Use: Former  Substance and Sexual Activity   Alcohol use: Yes    Alcohol/week: 2.0 standard drinks of alcohol    Types: 2 Standard drinks or equivalent per week    Comment: 2-3 per week   Drug use: No   Sexual activity: Yes    Partners: Female    Comment: wife, real Investment banker, corporate, no dairy, minimal fried foods  Other Topics Concern   Not on file  Social History Narrative   1 biological son   1 adopted child   3 step children   Social Determinants of Radio broadcast assistant Strain: Not on file  Food Insecurity: Not on file  Transportation Needs: Not on file  Physical Activity: Not on file  Stress: Not on file  Social Connections: Not on file  Intimate Partner Violence: Not on file    Outpatient Medications Prior to Visit  Medication Sig Dispense Refill   allopurinol (ZYLOPRIM) 100 MG tablet TAKE 1 TABLET DAILY 90 tablet 3   aspirin EC 81 MG tablet Take 1 tablet (81 mg total) by mouth daily. Swallow whole. 90 tablet 3   atorvastatin (LIPITOR) 80 MG tablet TAKE 1 TABLET DAILY AT 6 P.M. (SCHEDULE AN APPOINTMENT FOR FUTURE REFILLS) 60 tablet 5   budesonide (PULMICORT) 0.5 MG/2ML nebulizer solution Take 8 mLs (2 mg total) by nebulization at bedtime. 240 mL 1   buPROPion (WELLBUTRIN XL) 300 MG 24 hr tablet TAKE 1 TABLET DAILY 90 tablet 3   cetirizine (ZYRTEC) 10 MG tablet Take 10 mg by mouth daily as needed for allergies.     clobetasol ointment (TEMOVATE) 9.14 % Apply 1 application topically 2 (two) times daily as needed (psoriasis). 60 g 2   clotrimazole (LOTRIMIN) 1 % cream Apply topically 2 (two) times daily.     ezetimibe (ZETIA) 10 MG tablet Take 1 tablet (10 mg total) by mouth daily. 90 tablet 3   hydrocortisone 2.5 % cream Apply topically 2 (two) times daily.     indomethacin (INDOCIN) 50 MG capsule TAKE 1 CAPSULE TWICE A DAY AS NEEDED 180 capsule 3   lansoprazole (PREVACID) 30 MG capsule Take 1 capsule (30 mg  total) by mouth 2 (two) times daily before a meal. 60 capsule 2   losartan (COZAAR) 50 MG tablet Take 1 tablet (50 mg total) by mouth daily. 90 tablet 3   Melatonin 10 MG CAPS Take 10 mg by mouth at bedtime as needed (sleep).     Meloxicam 15 MG TBDP Take by mouth as needed.  metoprolol succinate (TOPROL-XL) 50 MG 24 hr tablet Take 1 tablet (50 mg total) by mouth daily. TAKE 1 TABLET DAILY WITH OR IMMEDIATELY FOLLOWING A MEAL 90 tablet 2   Multiple Vitamin (MULTIVITAMIN WITH MINERALS) TABS tablet Take 1 tablet by mouth daily.     mupirocin ointment (BACTROBAN) 2 % Place 1 application into the nose 2 (two) times daily. 30 g 1   zolpidem (AMBIEN) 10 MG tablet TAKE 1/2 TO 1 TABLET(5 TO 10 MG) BY MOUTH AT BEDTIME AS NEEDED FOR SLEEP 30 tablet 1   No facility-administered medications prior to visit.    Allergies  Allergen Reactions   Neosporin [Neomycin-Bacitracin Zn-Polymyx] Hives and Itching   Polysporin [Bacitracin-Polymyxin B] Hives and Itching   Amoxicillin Itching and Rash    Review of Systems  Constitutional:  Negative for chills and fever.  HENT:  Positive for congestion and sore throat. Negative for ear pain.   Respiratory:  Positive for cough and sputum production. Negative for shortness of breath.   Cardiovascular:  Negative for chest pain and palpitations.  Gastrointestinal:  Negative for abdominal pain, blood in stool, constipation, diarrhea, nausea and vomiting.  Genitourinary:  Negative for dysuria, frequency, hematuria and urgency.  Musculoskeletal:  Positive for myalgias.  Skin:           Neurological:  Negative for headaches.       Objective:    Physical Exam Constitutional:      General: He is not in acute distress.    Appearance: Normal appearance. He is normal weight. He is not ill-appearing.  HENT:     Head: Normocephalic and atraumatic.     Right Ear: Tympanic membrane, ear canal and external ear normal.     Left Ear: Tympanic membrane, ear canal and  external ear normal.     Nose: Nose normal.     Mouth/Throat:     Mouth: Mucous membranes are moist.     Pharynx: Oropharynx is clear.  Eyes:     General:        Right eye: No discharge.        Left eye: No discharge.     Extraocular Movements: Extraocular movements intact.     Right eye: No nystagmus.     Left eye: No nystagmus.     Pupils: Pupils are equal, round, and reactive to light.  Neck:     Vascular: No carotid bruit.  Cardiovascular:     Rate and Rhythm: Normal rate and regular rhythm.     Pulses: Normal pulses.     Heart sounds: Normal heart sounds. No murmur heard.    No gallop.  Pulmonary:     Effort: Pulmonary effort is normal. No respiratory distress.     Breath sounds: Normal breath sounds. No wheezing or rales.  Abdominal:     General: Bowel sounds are normal.     Palpations: Abdomen is soft.     Tenderness: There is no abdominal tenderness. There is no guarding.  Musculoskeletal:        General: Normal range of motion.     Cervical back: Normal range of motion.     Right lower leg: No edema.     Left lower leg: No edema.     Comments: Muscle strength 5/5 on upper and lower extremities.   Lymphadenopathy:     Cervical: No cervical adenopathy.  Skin:    General: Skin is warm and dry.  Neurological:     Mental Status: He is alert  and oriented to person, place, and time.     Sensory: Sensation is intact.     Motor: Motor function is intact.     Coordination: Coordination is intact.     Deep Tendon Reflexes:     Reflex Scores:      Patellar reflexes are 2+ on the right side and 2+ on the left side. Psychiatric:        Mood and Affect: Mood normal.        Behavior: Behavior normal.        Judgment: Judgment normal.     BP (!) 148/90   Pulse 85   Temp (!) 100.5 F (38.1 C) (Oral)   Resp 16   Ht _0  (1.778 m)   Wt 204 lb (92.5 kg)   SpO2 98%   BMI 29.27 kg/m  Wt Readings from Last 3 Encounters:  01/29/22 204 lb (92.5 kg)  01/22/22 210 lb  (95.3 kg)  01/15/22 206 lb (93.4 kg)    Diabetic Foot Exam - Simple   No data filed    Lab Results  Component Value Date   WBC 7.7 01/29/2022   HGB 12.2 (L) 01/29/2022   HCT 37.7 (L) 01/29/2022   PLT 261.0 01/29/2022   GLUCOSE 97 01/29/2022   CHOL 148 01/29/2022   TRIG 84.0 01/29/2022   HDL 48.50 01/29/2022   LDLDIRECT 149.4 09/13/2007   LDLCALC 83 01/29/2022   ALT 30 01/29/2022   AST 29 01/29/2022   NA 137 01/29/2022   K 4.4 01/29/2022   CL 101 01/29/2022   CREATININE 1.02 01/29/2022   BUN 18 01/29/2022   CO2 27 01/29/2022   TSH 1.54 01/29/2022   PSA 0.37 01/29/2022   HGBA1C 6.2 01/29/2022    Lab Results  Component Value Date   TSH 1.54 01/29/2022   Lab Results  Component Value Date   WBC 7.7 01/29/2022   HGB 12.2 (L) 01/29/2022   HCT 37.7 (L) 01/29/2022   MCV 85.3 01/29/2022   PLT 261.0 01/29/2022   Lab Results  Component Value Date   NA 137 01/29/2022   K 4.4 01/29/2022   CO2 27 01/29/2022   GLUCOSE 97 01/29/2022   BUN 18 01/29/2022   CREATININE 1.02 01/29/2022   BILITOT 0.5 01/29/2022   ALKPHOS 92 01/29/2022   AST 29 01/29/2022   ALT 30 01/29/2022   PROT 7.4 01/29/2022   ALBUMIN 4.3 01/29/2022   CALCIUM 9.5 01/29/2022   ANIONGAP 8 03/11/2014   GFR 79.19 01/29/2022   Lab Results  Component Value Date   CHOL 148 01/29/2022   Lab Results  Component Value Date   HDL 48.50 01/29/2022   Lab Results  Component Value Date   LDLCALC 83 01/29/2022   Lab Results  Component Value Date   TRIG 84.0 01/29/2022   Lab Results  Component Value Date   CHOLHDL 3 01/29/2022   Lab Results  Component Value Date   HGBA1C 6.2 01/29/2022      Assessment & Plan:  Colonoscopy: Last completed on 12/04/2019.  - Hemorrhoids found on digital rectal exam. - Narrowing in the recto-sigmoid colon in region of diverticulosis with significant decreased mobility/restriction. Traversed only after placing patient in Right Decubitus position and significant  water immersion. - Five 2 to 7 mm polyps in the rectum, at the recto-sigmoid colon and in the transverse colon, removed with a cold snare. Resected and retrieved. - Diverticulosis in the recto-sigmoid colon and in the sigmoid colon. - Normal mucosa  in the entire examined colon otherwise. - Non-bleeding non-thrombosed external and internal hemorrhoids. Repeat in 10 years.  PSA: Last completed on 06/10/2020. 0.40 ng/mL. Blood work today will recheck this.  Advanced Directives: Encouraged patient to completed advanced care planning documents.  Congestion/Cough: Prescribed Promethazine DM syrup and recommended Tylenol.  Healthy Lifestyle: Encouraged adequate sleep, exercise, heart healthy diet, and hydration.  Hypertension: Patient has been told to record at-home blood pressure measurements and be conscious about his salt intake.  Immunizations: Encouraged patient to receive COVID-19 immunization.  Problem List Items Addressed This Visit     Hyperlipidemia, mixed    Tolerating statin, encouraged heart healthy diet, avoid trans fats, minimize simple carbs and saturated fats. Increase exercise as tolerated tolerating Atorvastatin      Relevant Orders   Lipid panel (Completed)   Essential hypertension - Primary    Well controlled, no changes to meds. Encouraged heart healthy diet such as the DASH diet and exercise as tolerated.       Relevant Orders   CBC with Differential/Platelet (Completed)   TSH (Completed)   Preventative health care    Patient encouraged to maintain heart healthy diet, regular exercise, adequate sleep. Consider daily probiotics. Take medications as prescribed. Labs ordered and reviewed       Hyperglycemia    hgba1c acceptable, minimize simple carbs. Increase exercise as tolerated.       Relevant Orders   Comprehensive metabolic panel (Completed)   Hemoglobin A1c (Completed)   Gout    Hydrate and monitor       Nocturia   Relevant Orders   PSA  (Completed)   Fever    Congestion, cough, Encouraged increased rest and hydration, add probiotics, zinc such as Coldeze or Xicam. Treat fevers as needed given Promethazine dm to use prn. Was negative for covid in office but was encouraged to check again the next day and then he tested positive, Paxlovid was sent in for him      Relevant Orders   POC COVID-19 (Completed)   POCT Influenza A/B (Completed)   Eosinophilic esophagitis    Being treated by Dr Dakota [name] of gastroenterology and doing better.      Meds ordered this encounter  Medications   promethazine-dextromethorphan (PROMETHAZINE-DM) 6.25-15 MG/5ML syrup    Sig: Take 2.5-5 mLs by mouth 3 (three) times daily as needed for cough.    Dispense:  180 mL    Refill:  0   I, Penni Homans, MD, personally preformed the services described in this documentation.  All medical record entries made by the scribe were at my direction and in my presence.  I have reviewed the chart and discharge instructions (if applicable) and agree that the record reflects my personal performance and is accurate and complete. 01/29/2022  I,Mohammed Iqbal,acting as a scribe for Penni Homans, MD.,have documented all relevant documentation on the behalf of Penni Homans, MD,as directed by  Penni Homans, MD while in the presence of Penni Homans, MD.  Penni Homans, MD

## 2022-01-29 NOTE — Patient Instructions (Addendum)
Report if BP if consistently over 140/90 Preventive Care 55-61 Years Old, Male Preventive care refers to lifestyle choices and visits with your health care provider that can promote health and wellness. Preventive care visits are also called wellness exams. What can I expect for my preventive care visit? Counseling During your preventive care visit, your health care provider may ask about your: Medical history, including: Past medical problems. Family medical history. Current health, including: Emotional well-being. Home life and relationship well-being. Sexual activity. Lifestyle, including: Alcohol, nicotine or tobacco, and drug use. Access to firearms. Diet, exercise, and sleep habits. Safety issues such as seatbelt and bike helmet use. Sunscreen use. Work and work Statistician. Physical exam Your health care provider will check your: Height and weight. These may be used to calculate your BMI (body mass index). BMI is a measurement that tells if you are at a healthy weight. Waist circumference. This measures the distance around your waistline. This measurement also tells if you are at a healthy weight and may help predict your risk of certain diseases, such as type 2 diabetes and high blood pressure. Heart rate and blood pressure. Body temperature. Skin for abnormal spots. What immunizations do I need?  Vaccines are usually given at various ages, according to a schedule. Your health care provider will recommend vaccines for you based on your age, medical history, and lifestyle or other factors, such as travel or where you work. What tests do I need? Screening Your health care provider may recommend screening tests for certain conditions. This may include: Lipid and cholesterol levels. Diabetes screening. This is done by checking your blood sugar (glucose) after you have not eaten for a while (fasting). Hepatitis B test. Hepatitis C test. HIV (human immunodeficiency virus)  test. STI (sexually transmitted infection) testing, if you are at risk. Lung cancer screening. Prostate cancer screening. Colorectal cancer screening. Talk with your health care provider about your test results, treatment options, and if necessary, the need for more tests. Follow these instructions at home: Eating and drinking  Eat a diet that includes fresh fruits and vegetables, whole grains, lean protein, and low-fat dairy products. Take vitamin and mineral supplements as recommended by your health care provider. Do not drink alcohol if your health care provider tells you not to drink. If you drink alcohol: Limit how much you have to 0-2 drinks a day. Know how much alcohol is in your drink. In the [place] one drink equals one 12 oz bottle of beer (355 mL), one 5 oz glass of wine (148 mL), or one 1 oz glass of hard liquor (44 mL). Lifestyle Brush your teeth every morning and night with fluoride toothpaste. Floss one time each day. Exercise for at least 30 minutes 5 or more days each week. Do not use any products that contain nicotine or tobacco. These products include cigarettes, chewing tobacco, and vaping devices, such as e-cigarettes. If you need help quitting, ask your health care provider. Do not use drugs. If you are sexually active, practice safe sex. Use a condom or other form of protection to prevent STIs. Take aspirin only as told by your health care provider. Make sure that you understand how much to take and what form to take. Work with your health care provider to find out whether it is safe and beneficial for you to take aspirin daily. Find healthy ways to manage stress, such as: Meditation, yoga, or listening to music. Journaling. Talking to a trusted person. Spending time with friends and family.  Minimize exposure to UV radiation to reduce your risk of skin cancer. Safety Always wear your seat belt while driving or riding in a vehicle. Do not drive: If you have been  drinking alcohol. Do not ride with someone who has been drinking. When you are tired or distracted. While texting. If you have been using any mind-altering substances or drugs. Wear a helmet and other protective equipment during sports activities. If you have firearms in your house, make sure you follow all gun safety procedures. What's next? Go to your health care provider once a year for an annual wellness visit. Ask your health care provider how often you should have your eyes and teeth checked. Stay up to date on all vaccines. This information is not intended to replace advice given to you by your health care provider. Make sure you discuss any questions you have with your health care provider. Document Revised: 07/17/2020 Document Reviewed: 07/17/2020 Elsevier Patient Education  [employer].

## 2022-01-30 ENCOUNTER — Telehealth: Payer: Self-pay

## 2022-01-30 ENCOUNTER — Encounter: Payer: Self-pay | Admitting: Family Medicine

## 2022-01-30 ENCOUNTER — Other Ambulatory Visit: Payer: Self-pay | Admitting: Family Medicine

## 2022-01-30 ENCOUNTER — Other Ambulatory Visit: Payer: Self-pay

## 2022-01-30 DIAGNOSIS — I10 Essential (primary) hypertension: Secondary | ICD-10-CM

## 2022-01-30 LAB — COMPREHENSIVE METABOLIC PANEL
ALT: 30 U/L (ref 0–53)
AST: 29 U/L (ref 0–37)
Albumin: 4.3 g/dL (ref 3.5–5.2)
Alkaline Phosphatase: 92 U/L (ref 39–117)
BUN: 18 mg/dL (ref 6–23)
CO2: 27 mEq/L (ref 19–32)
Calcium: 9.5 mg/dL (ref 8.4–10.5)
Chloride: 101 mEq/L (ref 96–112)
Creatinine, Ser: 1.02 mg/dL (ref 0.40–1.50)
GFR: 79.19 mL/min (ref >60.00)
Glucose, Bld: 97 mg/dL (ref 70–99)
Potassium: 4.4 mEq/L (ref 3.5–5.1)
Sodium: 137 mEq/L (ref 135–145)
Total Bilirubin: 0.5 mg/dL (ref 0.2–1.2)
Total Protein: 7.4 g/dL (ref 6.0–8.3)

## 2022-01-30 LAB — LIPID PANEL
Cholesterol: 148 mg/dL (ref 0–200)
HDL: 48.5 mg/dL (ref >39.00)
LDL Cholesterol: 83 mg/dL (ref 0–99)
NonHDL: 99.85
Total CHOL/HDL Ratio: 3
Triglycerides: 84 mg/dL (ref 0.0–149.0)
VLDL: 16.8 mg/dL (ref 0.0–40.0)

## 2022-01-30 LAB — CBC WITH DIFFERENTIAL/PLATELET
Basophils Absolute: 0.1 10*3/uL (ref 0.0–0.1)
Basophils Relative: 1.1 % (ref 0.0–3.0)
Eosinophils Absolute: 0.1 10*3/uL (ref 0.0–0.7)
Eosinophils Relative: 1.2 % (ref 0.0–5.0)
HCT: 37.7 % — ABNORMAL LOW (ref 39.0–52.0)
Hemoglobin: 12.2 g/dL — ABNORMAL LOW (ref 13.0–17.0)
Lymphocytes Relative: 9.6 % — ABNORMAL LOW (ref 12.0–46.0)
Lymphs Abs: 0.7 10*3/uL (ref 0.7–4.0)
MCHC: 32.4 g/dL (ref 30.0–36.0)
MCV: 85.3 fl (ref 78.0–100.0)
Monocytes Absolute: 1.1 10*3/uL — ABNORMAL HIGH (ref 0.1–1.0)
Monocytes Relative: 14.9 % — ABNORMAL HIGH (ref 3.0–12.0)
Neutro Abs: 5.6 10*3/uL (ref 1.4–7.7)
Neutrophils Relative %: 73.2 % (ref 43.0–77.0)
Platelets: 261 10*3/uL (ref 150.0–400.0)
RBC: 4.42 Mil/uL (ref 4.22–5.81)
RDW: 16.8 % — ABNORMAL HIGH (ref 11.5–15.5)
WBC: 7.7 10*3/uL (ref 4.0–10.5)

## 2022-01-30 LAB — HEMOGLOBIN A1C: Hgb A1c MFr Bld: 6.2 % (ref 4.6–6.5)

## 2022-01-30 LAB — TSH: TSH: 1.54 u[IU]/mL (ref 0.35–5.50)

## 2022-01-30 LAB — PSA: PSA: 0.37 ng/mL (ref 0.10–4.00)

## 2022-01-30 MED ORDER — NIRMATRELVIR/RITONAVIR (PAXLOVID)TABLET: 3.0000 | ORAL_TABLET | Freq: Two times a day (BID) | ORAL | 0 refills | Status: AC

## 2022-01-30 NOTE — Telephone Encounter (Signed)
Done

## 2022-01-30 NOTE — Telephone Encounter (Signed)
Pt Dakota [name] called stating that he took a Covid test positive this morning was at office yesterday's visit was negative.Pt stated feeling bad coughing, headaches, fever please advised.

## 2022-01-31 DIAGNOSIS — K200 Eosinophilic esophagitis: Secondary | ICD-10-CM | POA: Insufficient documentation

## 2022-01-31 DIAGNOSIS — R351 Nocturia: Secondary | ICD-10-CM | POA: Insufficient documentation

## 2022-01-31 DIAGNOSIS — R509 Fever, unspecified: Secondary | ICD-10-CM | POA: Insufficient documentation

## 2022-01-31 NOTE — Assessment & Plan Note (Signed)
Patient encouraged to maintain heart healthy diet, regular exercise, adequate sleep. Consider daily probiotics. Take medications as prescribed. Labs ordered and reviewed 

## 2022-01-31 NOTE — Assessment & Plan Note (Addendum)
Congestion, cough, Encouraged increased rest and hydration, add probiotics, zinc such as Coldeze or Xicam. Treat fevers as needed given Promethazine dm to use prn. Was negative for covid in office but was encouraged to check again the next day and then he tested positive, Paxlovid was sent in for him

## 2022-01-31 NOTE — Assessment & Plan Note (Signed)
Being treated by Dr Loletha Carrow of gastroenterology and doing better.

## 2022-02-03 NOTE — Telephone Encounter (Signed)
Biopsies show considerable improvement in the EoE, so he should continue the budesonide at current dose.  He may need this medicine indefinitely if it continues to work well.  Their questions are good ones, and they require an office visit conversation to properly address. Please arrange.  - HD

## 2022-02-06 ENCOUNTER — Encounter: Payer: Self-pay | Admitting: Gastroenterology

## 2022-02-06 ENCOUNTER — Ambulatory Visit: Payer: Managed Care, Other (non HMO) | Admitting: Gastroenterology

## 2022-02-06 VITALS — BP 154/84 | HR 56 | Ht 71.0 in | Wt 202.2 lb

## 2022-02-06 DIAGNOSIS — K200 Eosinophilic esophagitis: Secondary | ICD-10-CM

## 2022-02-06 NOTE — Progress Notes (Signed)
[facility] Progress Note  Chief Complaint: Eosinophilic esophagitis  Subjective  History: Dakota [name] follows up today after recent upper endoscopy.  That study was being done to reevaluate his EOE after having been on oral budesonide slurry for about 8 weeks and having had endoscopic dilation.  No recurrence of dysphagia at that point.There was significant improvement in the mucosal findings as well as on biopsies.  Dakota [name] is feeling well on his current treatment, and he denies dysphagia or odynophagia.  Appetite is good and weight stable.  He had some questions regarding the long-term use and safety of budesonide and wondered about Dupixent.  ROS: He had a sore throat and cough last week when diagnosed with COVID and now feels much better.  The patient's Past Medical, Family and Social History were reviewed and are on file in the EMR.  Objective:  Med list reviewed  Current Outpatient Medications:    allopurinol (ZYLOPRIM) 100 MG tablet, TAKE 1 TABLET DAILY, Disp: 90 tablet, Rfl: 3   aspirin EC 81 MG tablet, Take 1 tablet (81 mg total) by mouth daily. Swallow whole., Disp: 90 tablet, Rfl: 3   atorvastatin (LIPITOR) 80 MG tablet, TAKE 1 TABLET DAILY AT 6 P.M. (SCHEDULE AN APPOINTMENT FOR FUTURE REFILLS), Disp: 60 tablet, Rfl: 5   budesonide (PULMICORT) 0.5 MG/2ML nebulizer solution, Take 8 mLs (2 mg total) by nebulization at bedtime., Disp: 240 mL, Rfl: 1   buPROPion (WELLBUTRIN XL) 300 MG 24 hr tablet, TAKE 1 TABLET DAILY, Disp: 90 tablet, Rfl: 3   cetirizine (ZYRTEC) 10 MG tablet, Take 10 mg by mouth daily as needed for allergies., Disp: , Rfl:    clobetasol ointment (TEMOVATE) 3.29 %, Apply 1 application topically 2 (two) times daily as needed (psoriasis)., Disp: 60 g, Rfl: 2   clotrimazole (LOTRIMIN) 1 % cream, Apply topically 2 (two) times daily., Disp: , Rfl:    ezetimibe (ZETIA) 10 MG tablet, Take 1 tablet (10 mg total) by mouth daily., Disp: 90 tablet, Rfl: 3    hydrocortisone 2.5 % cream, Apply topically 2 (two) times daily., Disp: , Rfl:    indomethacin (INDOCIN) 50 MG capsule, TAKE 1 CAPSULE TWICE A DAY AS NEEDED, Disp: 180 capsule, Rfl: 3   lansoprazole (PREVACID) 30 MG capsule, Take 1 capsule (30 mg total) by mouth 2 (two) times daily before a meal., Disp: 60 capsule, Rfl: 2   losartan (COZAAR) 50 MG tablet, Take 1 tablet (50 mg total) by mouth daily., Disp: 90 tablet, Rfl: 3   Melatonin 10 MG CAPS, Take 10 mg by mouth at bedtime as needed (sleep)., Disp: , Rfl:    Meloxicam 15 MG TBDP, Take by mouth as needed., Disp: , Rfl:    metoprolol succinate (TOPROL-XL) 50 MG 24 hr tablet, Take 1 tablet (50 mg total) by mouth daily. TAKE 1 TABLET DAILY WITH OR IMMEDIATELY FOLLOWING A MEAL, Disp: 90 tablet, Rfl: 2   Multiple Vitamin (MULTIVITAMIN WITH MINERALS) TABS tablet, Take 1 tablet by mouth daily., Disp: , Rfl:    mupirocin ointment (BACTROBAN) 2 %, Place 1 application into the nose 2 (two) times daily., Disp: 30 g, Rfl: 1   promethazine-dextromethorphan (PROMETHAZINE-DM) 6.25-15 MG/5ML syrup, Take 2.5-5 mLs by mouth 3 (three) times daily as needed for cough., Disp: 180 mL, Rfl: 0   zolpidem (AMBIEN) 10 MG tablet, TAKE 1/2 TO 1 TABLET(5 TO 10 MG) BY MOUTH AT BEDTIME AS NEEDED FOR SLEEP, Disp: 30 tablet, Rfl: 1   Vital signs in last  24 hrs: Vitals:   02/06/22 1059  BP: (!) 154/84  Pulse: (!) 56   Wt Readings from Last 3 Encounters:  02/06/22 202 lb 4 oz (91.7 kg)  01/29/22 204 lb (92.5 kg)  01/22/22 210 lb (95.3 kg)    Physical Exam  Looks well.  No additional exam, entire visit spent in result review and discussion.  Labs:   ___________________________________________ Radiologic studies:   ____________________________________________ Other: December 2023 upper endoscopic pathology report  1. Surgical [P], distal esophagus history of EOE REACTIVE SQUAMOUS MUCOSA WITH EOSINOPHILS (11 EOS/HIGH POWER FIELD) (SEE MICROSCOPIC COMMENT) 2.  Surgical [P], mid esophagus REACTIVE SQUAMOUS MUCOSA WITH EOSINOPHILS (4 EOS/HIGH POWER FIELD) (SEE MICROSCOPIC COMMENT)  (Eosinophilic infiltrate had been too numerous to count on 11/05/2021 EGD biopsies) _____________________________________________ Assessment & Plan  Assessment: Encounter Diagnosis  Name Primary?   Eosinophilic esophagitis Yes   We had a further discussion about EOE, and I had previously sent him some links to further information in a portal message. I reassured him the budesonide is generally safe for long-term use even though it has not been FDA approved for this indication.  So little of the budesonide is systemically active after first-pass metabolism that there are very low chances of long-term steroid related side effects. He is aware of the chance of oropharyngeal or esophageal yeast infection and I described how that might present with oral thrush and/or painful swallowing.Dakota [name]  He will let me know if something like that occurs. Regarding Dupixent, it is generally used for patients that either have not responded to, or have had intolerance/side effect from PPI or budesonide therapy.  (That is typically the scenario in which insurance will approve it) it remains an option for him should that occur.  Plan: I will see him back in a year or sooner if needed.  He was encouraged to contact us by phone or portal message as the need arises.  20 minutes were spent on this encounter (including chart review, history/exam, counseling/coordination of care, and documentation) > 50% of that time was spent on counseling and coordination of care.   Nelida Meuse III

## 2022-02-06 NOTE — Patient Instructions (Addendum)
_______________________________________________________  If you are age 62 or older, your body mass index should be between 23-30. Your Body mass index is 28.21 kg/m. If this is out of the aforementioned range listed, please consider follow up with your Primary Care Provider.  If you are age 81 or younger, your body mass index should be between 19-25. Your Body mass index is 28.21 kg/m. If this is out of the aformentioned range listed, please consider follow up with your Primary Care Provider.   ________________________________________________________  The [facility] providers would like to encourage you to use [facility] to communicate with providers for non-urgent requests or questions.  Due to long hold times on the telephone, sending your provider a message by [facility] may be a faster and more efficient way to get a response.  Please allow 48 business hours for a response.  Please remember that this is for non-urgent requests.  _______________________________________________________  It was a pleasure to see you today!  Thank you for trusting me with your gastrointestinal care!

## 2022-02-25 ENCOUNTER — Other Ambulatory Visit: Payer: Self-pay

## 2022-02-25 MED ORDER — LANSOPRAZOLE 30 MG PO CPDR: 30.0000 mg | DELAYED_RELEASE_CAPSULE | Freq: Two times a day (BID) | ORAL | 2 refills | Status: DC

## 2022-03-04 ENCOUNTER — Encounter: Payer: Managed Care, Other (non HMO) | Admitting: Gastroenterology

## 2022-03-11 ENCOUNTER — Other Ambulatory Visit: Payer: Self-pay

## 2022-03-11 MED ORDER — BUDESONIDE 0.5 MG/2ML IN SUSP: 2.0000 mg | Freq: Every day | RESPIRATORY_TRACT | 1 refills | Status: DC

## 2022-03-20 ENCOUNTER — Other Ambulatory Visit: Payer: Self-pay | Admitting: Family Medicine

## 2022-03-23 NOTE — Telephone Encounter (Signed)
Requesting: zolpidem 52m Contract: No Last was 03/05/21 (will get at next visit) UDS: 12/2018 (will get at next visit) Last Visit: 01/29/2022 Next Visit: 08/03/2022 Last Refill: 01/20/22  Please Advise

## 2022-03-24 ENCOUNTER — Encounter: Payer: Self-pay | Admitting: Family Medicine

## 2022-03-26 ENCOUNTER — Other Ambulatory Visit (INDEPENDENT_AMBULATORY_CARE_PROVIDER_SITE_OTHER): Payer: Managed Care, Other (non HMO)

## 2022-03-26 ENCOUNTER — Other Ambulatory Visit: Payer: Self-pay | Admitting: *Deleted

## 2022-03-26 DIAGNOSIS — D649 Anemia, unspecified: Secondary | ICD-10-CM

## 2022-03-27 LAB — FECAL OCCULT BLOOD, IMMUNOCHEMICAL: Fecal Occult Bld: NEGATIVE

## 2022-04-02 ENCOUNTER — Encounter: Payer: Self-pay | Admitting: Cardiology

## 2022-04-02 MED ORDER — METOPROLOL SUCCINATE ER 50 MG PO TB24: 50.0000 mg | ORAL_TABLET | Freq: Every day | ORAL | 0 refills | Status: DC

## 2022-05-18 ENCOUNTER — Encounter: Payer: Self-pay | Admitting: *Deleted

## 2022-06-26 ENCOUNTER — Encounter: Payer: Self-pay | Admitting: Cardiology

## 2022-06-26 ENCOUNTER — Encounter: Payer: Self-pay | Admitting: Family Medicine

## 2022-06-26 ENCOUNTER — Other Ambulatory Visit: Payer: Self-pay

## 2022-06-26 MED ORDER — BUPROPION HCL ER (XL) 300 MG PO TB24: 300.0000 mg | ORAL_TABLET | Freq: Every day | ORAL | 3 refills | Status: DC

## 2022-06-29 ENCOUNTER — Other Ambulatory Visit: Payer: Self-pay | Admitting: Cardiology

## 2022-07-01 ENCOUNTER — Other Ambulatory Visit: Payer: Self-pay | Admitting: Cardiology

## 2022-07-15 ENCOUNTER — Other Ambulatory Visit: Payer: Self-pay | Admitting: Family Medicine

## 2022-07-20 ENCOUNTER — Other Ambulatory Visit: Payer: Self-pay | Admitting: Family Medicine

## 2022-07-21 NOTE — Telephone Encounter (Signed)
Requesting: Ambien 10mg   Contract:01/23/2021 UDS: 12/13/2018 Last Visit: 01/29/22 Next Visit: 08/03/22 Last Refill: 03/23/22 #30 and 3RF  Please Advise

## 2022-08-02 NOTE — Assessment & Plan Note (Signed)
Tolerating statin, encouraged heart healthy diet, avoid trans fats, minimize simple carbs and saturated fats. Increase exercise as tolerated tolerating Atorvastatin 

## 2022-08-02 NOTE — Assessment & Plan Note (Signed)
Follows with gastroenterology intermittently and stable on Lansoprazole

## 2022-08-02 NOTE — Assessment & Plan Note (Signed)
hgba1c acceptable, minimize simple carbs. Increase exercise as tolerated.  

## 2022-08-02 NOTE — Assessment & Plan Note (Signed)
Stable on current meds 

## 2022-08-02 NOTE — Assessment & Plan Note (Signed)
Hydrate and monitor 

## 2022-08-02 NOTE — Progress Notes (Signed)
Subjective:    Patient ID: Dakota [name], male    DOB: May 19, 1960, 61 y.o.   MRN: [id number]  No chief complaint on file.   HPI Discussed the use of AI scribe software for clinical note transcription with the patient, who gave verbal consent to proceed.  History of Present Illness        Patient is a 62 yo male in today for follow up on chronic medical concerns. No recent febrile illness or hospitalizations. Denies CP/palp/SOB/HA/congestion/fevers/GI or GU c/o. 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   Kidney stone on left side 09/19/2013   Left knee pain 12/25/2012   Mumps as a child   Onychomycosis 09/30/2014   Overweight 12/16/2015   Preventative health care 09/24/2013    Past Surgical History:  Procedure Laterality Date   COLONOSCOPY     COLONOSCOPY WITH PROPOFOL N/A 12/04/2019   Procedure: COLONOSCOPY WITH PROPOFOL;  Surgeon: Lemar Lofty., MD;  Location: [facility] ENDOSCOPY;  Service: Gastroenterology;  Laterality: N/A;  ultra slim scope    CORONARY STENT PLACEMENT  01/14/2005   FLEXIBLE BRONCHOSCOPY W/ UPPER ENDOSCOPY  11/03/2021   LITHOTRIPSY  03/2014   POLYPECTOMY  12/04/2019   Procedure: POLYPECTOMY;  Surgeon: [name], [name], MD;  Location: [facility] ENDOSCOPY;  Service: Gastroenterology;;   UPPER GASTROINTESTINAL ENDOSCOPY  09/12/2019   [facility]    Family History  Problem Relation Age of Onset   Multiple sclerosis Mother    Cancer Father        esophagus/ brain   Alcohol abuse  Father        smoker   Esophageal cancer Father    Tremor Father    Seizures Sister        AVM   COPD Sister    Alcohol abuse Sister        addiction, recovered   Tremor Son    Cancer Paternal Aunt        leukemia   Heart disease Maternal Grandfather        MI   Colon cancer Neg Hx    Colon polyps Neg Hx    Rectal cancer Neg Hx    Stomach cancer Neg Hx     Social History   Socioeconomic History   Marital status: Married    Spouse name: Not on file   Number of children: 2   Years of education: Not on file   Highest education level: Bachelor's degree (e.g., BA, AB, BS)  Occupational History   Occupation: Technical sales engineer  Tobacco Use   Smoking status: Former    Packs/day: 0.50    Years: 20.00    Additional pack years: 0.00    Total pack years: 10.00    Types: Cigarettes    Quit date: 02/20/2015    Years since quitting: 7.4   Smokeless tobacco: Never  Vaping Use   Vaping Use: Former  Substance  and Sexual Activity   Alcohol use: Yes    Alcohol/week: 2.0 standard drinks of alcohol    Types: 2 Standard drinks or equivalent per week    Comment: 2-3 per week   Drug use: No   Sexual activity: Yes    Partners: Female    Comment: wife, real Psychologist, occupational, no dairy, minimal fried foods  Other Topics Concern   Not on file  Social History Narrative   1 biological son   1 adopted child   3 step children   Social Determinants of Health   Financial Resource Strain: Low Risk  (07/27/2022)   Overall Financial Resource Strain (CARDIA)    Difficulty of Paying Living Expenses: Not hard at all  Food Insecurity: No Food Insecurity (07/27/2022)   Hunger Vital Sign    Worried About Running Out of Food in the Last Year: Never true    Ran Out of Food in the Last Year: Never true  Transportation Needs: No Transportation Needs (07/27/2022)   PRAPARE - Administrator, Civil Service (Medical): No    Lack of Transportation (Non-Medical): No  Physical Activity:  Sufficiently Active (07/27/2022)   Exercise Vital Sign    Days of Exercise per Week: 4 days    Minutes of Exercise per Session: 120 min  Stress: Stress Concern Present (07/27/2022)   [employer] of Occupational Health - Occupational Stress Questionnaire    Feeling of Stress : To some extent  Social Connections: Moderately Integrated (07/27/2022)   Social Connection and Isolation Panel [NHANES]    Frequency of Communication with Friends and Family: More than three times a week    Frequency of Social Gatherings with Friends and Family: More than three times a week    Attends Religious Services: More than 4 times per year    Active Member of [employer] or Organizations: No    Attends Engineer, structural: Not on file    Marital Status: Married  Catering manager Violence: Not on file    Outpatient Medications Prior to Visit  Medication Sig Dispense Refill   allopurinol (ZYLOPRIM) 100 MG tablet TAKE 1 TABLET DAILY 90 tablet 3   aspirin EC 81 MG tablet Take 1 tablet (81 mg total) by mouth daily. Swallow whole. 90 tablet 3   atorvastatin (LIPITOR) 80 MG tablet TAKE 1 TABLET DAILY AT 6 P.M. (SCHEDULE AN APPOINTMENT FOR FUTURE REFILLS) 60 tablet 5   budesonide (PULMICORT) 0.5 MG/2ML nebulizer solution Take 8 mLs (2 mg total) by nebulization at bedtime. 240 mL 1   buPROPion (WELLBUTRIN XL) 300 MG 24 hr tablet Take 1 tablet (300 mg total) by mouth daily. 90 tablet 3   cetirizine (ZYRTEC) 10 MG tablet Take 10 mg by mouth daily as needed for allergies.     clobetasol ointment (TEMOVATE) 0.05 % Apply 1 application topically 2 (two) times daily as needed (psoriasis). 60 g 2   clotrimazole (LOTRIMIN) 1 % cream Apply topically 2 (two) times daily.     ezetimibe (ZETIA) 10 MG tablet Take 1 tablet (10 mg total) by mouth daily. 90 tablet 3   hydrocortisone 2.5 % cream Apply topically 2 (two) times daily.     indomethacin (INDOCIN) 50 MG capsule TAKE 1 CAPSULE TWICE A DAY AS NEEDED 180 capsule 3    lansoprazole (PREVACID) 30 MG capsule Take 1 capsule (30 mg total) by mouth 2 (two) times daily before a meal. 60 capsule 2   losartan (COZAAR) 50 MG tablet TAKE 1  TABLET DAILY 90 tablet 0   Melatonin 10 MG CAPS Take 10 mg by mouth at bedtime as needed (sleep).     Meloxicam 15 MG TBDP Take by mouth as needed.     metoprolol succinate (TOPROL-XL) 50 MG 24 hr tablet TAKE 1 TABLET DAILY, TAKE WITH OR IMMEDIATELY FOLLOWING A MEAL 90 tablet 0   Multiple Vitamin (MULTIVITAMIN WITH MINERALS) TABS tablet Take 1 tablet by mouth daily.     mupirocin ointment (BACTROBAN) 2 % Place 1 application into the nose 2 (two) times daily. 30 g 1   promethazine-dextromethorphan (PROMETHAZINE-DM) 6.25-15 MG/5ML syrup Take 2.5-5 mLs by mouth 3 (three) times daily as needed for cough. 180 mL 0   zolpidem (AMBIEN) 10 MG tablet TAKE 1/2 TO 1 TABLET(5 TO 10 MG) BY MOUTH AT BEDTIME AS NEEDED FOR SLEEP 30 tablet 5   No facility-administered medications prior to visit.    Allergies  Allergen Reactions   Neosporin [Neomycin-Bacitracin Zn-Polymyx] Hives and Itching   Polysporin [Bacitracin-Polymyxin B] Hives and Itching   Amoxicillin Itching and Rash    Review of Systems  Constitutional:  Negative for fever and malaise/fatigue.  HENT:  Negative for congestion.   Eyes:  Negative for blurred vision.  Respiratory:  Negative for shortness of breath.   Cardiovascular:  Negative for chest pain, palpitations and leg swelling.  Gastrointestinal:  Negative for abdominal pain, blood in stool and nausea.  Genitourinary:  Negative for dysuria and frequency.  Musculoskeletal:  Negative for falls.  Skin:  Negative for rash.  Neurological:  Negative for dizziness, loss of consciousness and headaches.  Endo/Heme/Allergies:  Negative for environmental allergies.  Psychiatric/Behavioral:  Negative for depression. The patient is not nervous/anxious.        Objective:    Physical Exam Vitals reviewed.  Constitutional:       Appearance: Normal appearance. He is not ill-appearing.  HENT:     Head: Normocephalic and atraumatic.     Nose: Nose normal.  Eyes:     Conjunctiva/sclera: Conjunctivae normal.  Cardiovascular:     Rate and Rhythm: Normal rate.     Pulses: Normal pulses.     Heart sounds: Normal heart sounds. No murmur heard. Pulmonary:     Effort: Pulmonary effort is normal.     Breath sounds: Normal breath sounds. No wheezing.  Abdominal:     Palpations: Abdomen is soft. There is no mass.     Tenderness: There is no abdominal tenderness.  Musculoskeletal:     Cervical back: Normal range of motion.     Right lower leg: No edema.     Left lower leg: No edema.  Skin:    General: Skin is warm and dry.  Neurological:     General: No focal deficit present.     Mental Status: He is alert and oriented to person, place, and time.  Psychiatric:        Mood and Affect: Mood normal.     There were no vitals taken for this visit. Wt Readings from Last 3 Encounters:  02/06/22 202 lb 4 oz (91.7 kg)  01/29/22 204 lb (92.5 kg)  01/22/22 210 lb (95.3 kg)    Diabetic Foot Exam - Simple   No data filed    Lab Results  Component Value Date   WBC 7.7 01/29/2022   HGB 12.2 (L) 01/29/2022   HCT 37.7 (L) 01/29/2022   PLT 261.0 01/29/2022   GLUCOSE 97 01/29/2022   CHOL 148 01/29/2022   TRIG  84.0 01/29/2022   HDL 48.50 01/29/2022   LDLDIRECT 149.4 09/13/2007   LDLCALC 83 01/29/2022   ALT 30 01/29/2022   AST 29 01/29/2022   NA 137 01/29/2022   K 4.4 01/29/2022   CL 101 01/29/2022   CREATININE 1.02 01/29/2022   BUN 18 01/29/2022   CO2 27 01/29/2022   TSH 1.54 01/29/2022   PSA 0.37 01/29/2022   HGBA1C 6.2 01/29/2022    Lab Results  Component Value Date   TSH 1.54 01/29/2022   Lab Results  Component Value Date   WBC 7.7 01/29/2022   HGB 12.2 (L) 01/29/2022   HCT 37.7 (L) 01/29/2022   MCV 85.3 01/29/2022   PLT 261.0 01/29/2022   Lab Results  Component Value Date   NA 137 01/29/2022    K 4.4 01/29/2022   CO2 27 01/29/2022   GLUCOSE 97 01/29/2022   BUN 18 01/29/2022   CREATININE 1.02 01/29/2022   BILITOT 0.5 01/29/2022   ALKPHOS 92 01/29/2022   AST 29 01/29/2022   ALT 30 01/29/2022   PROT 7.4 01/29/2022   ALBUMIN 4.3 01/29/2022   CALCIUM 9.5 01/29/2022   ANIONGAP 8 03/11/2014   GFR 79.19 01/29/2022   Lab Results  Component Value Date   CHOL 148 01/29/2022   Lab Results  Component Value Date   HDL 48.50 01/29/2022   Lab Results  Component Value Date   LDLCALC 83 01/29/2022   Lab Results  Component Value Date   TRIG 84.0 01/29/2022   Lab Results  Component Value Date   CHOLHDL 3 01/29/2022   Lab Results  Component Value Date   HGBA1C 6.2 01/29/2022       Assessment & Plan:  Hyperlipidemia, mixed Assessment & Plan: Tolerating statin, encouraged heart healthy diet, avoid trans fats, minimize simple carbs and saturated fats. Increase exercise as tolerated tolerating Atorvastatin   Essential hypertension Assessment & Plan: Well controlled, no changes to meds. Encouraged heart healthy diet such as the DASH diet and exercise as tolerated.    Hyperglycemia Assessment & Plan: hgba1c acceptable, minimize simple carbs. Increase exercise as tolerated.    Acute idiopathic gout of left knee Assessment & Plan: Hydrate and monitor    Depression, unspecified depression type Assessment & Plan: Stable on current meds   Eosinophilic esophagitis Assessment & Plan: Follows with gastroenterology intermittently and stable on Lansoprazole     Assessment and Plan              Danise Edge, MD

## 2022-08-02 NOTE — Assessment & Plan Note (Signed)
Well controlled, no changes to meds. Encouraged heart healthy diet such as the DASH diet and exercise as tolerated.  °

## 2022-08-03 ENCOUNTER — Ambulatory Visit: Payer: Managed Care, Other (non HMO) | Admitting: Family Medicine

## 2022-08-03 VITALS — BP 128/82 | HR 64 | Temp 97.8°F | Resp 12 | Ht 71.0 in | Wt 199.4 lb

## 2022-08-03 DIAGNOSIS — M10062 Idiopathic gout, left knee: Secondary | ICD-10-CM | POA: Diagnosis not present

## 2022-08-03 DIAGNOSIS — Z79899 Other long term (current) drug therapy: Secondary | ICD-10-CM

## 2022-08-03 DIAGNOSIS — G4700 Insomnia, unspecified: Secondary | ICD-10-CM

## 2022-08-03 DIAGNOSIS — K200 Eosinophilic esophagitis: Secondary | ICD-10-CM

## 2022-08-03 DIAGNOSIS — E782 Mixed hyperlipidemia: Secondary | ICD-10-CM

## 2022-08-03 DIAGNOSIS — I10 Essential (primary) hypertension: Secondary | ICD-10-CM

## 2022-08-03 DIAGNOSIS — R739 Hyperglycemia, unspecified: Secondary | ICD-10-CM

## 2022-08-03 DIAGNOSIS — F32A Depression, unspecified: Secondary | ICD-10-CM

## 2022-08-03 DIAGNOSIS — N486 Induration penis plastica: Secondary | ICD-10-CM

## 2022-08-03 LAB — HEMOGLOBIN A1C: Hgb A1c MFr Bld: 5.9 % (ref 4.6–6.5)

## 2022-08-03 LAB — COMPREHENSIVE METABOLIC PANEL
ALT: 28 U/L (ref 0–53)
AST: 25 U/L (ref 0–37)
Albumin: 4.3 g/dL (ref 3.5–5.2)
Alkaline Phosphatase: 102 U/L (ref 39–117)
BUN: 15 mg/dL (ref 6–23)
CO2: 27 mEq/L (ref 19–32)
Calcium: 10 mg/dL (ref 8.4–10.5)
Chloride: 100 mEq/L (ref 96–112)
Creatinine, Ser: 1.04 mg/dL (ref 0.40–1.50)
GFR: 77.09 mL/min (ref >60.00)
Glucose, Bld: 94 mg/dL (ref 70–99)
Potassium: 4.7 mEq/L (ref 3.5–5.1)
Sodium: 137 mEq/L (ref 135–145)
Total Bilirubin: 0.6 mg/dL (ref 0.2–1.2)
Total Protein: 7.7 g/dL (ref 6.0–8.3)

## 2022-08-03 LAB — LIPID PANEL
Cholesterol: 151 mg/dL (ref 0–200)
HDL: 40.5 mg/dL (ref >39.00)
LDL Cholesterol: 83 mg/dL (ref 0–99)
NonHDL: 110.84
Total CHOL/HDL Ratio: 4
Triglycerides: 138 mg/dL (ref 0.0–149.0)
VLDL: 27.6 mg/dL (ref 0.0–40.0)

## 2022-08-03 LAB — CBC WITH DIFFERENTIAL/PLATELET
Basophils Absolute: 0.1 10*3/uL (ref 0.0–0.1)
Basophils Relative: 1.1 % (ref 0.0–3.0)
Eosinophils Absolute: 0.3 10*3/uL (ref 0.0–0.7)
Eosinophils Relative: 5.3 % — ABNORMAL HIGH (ref 0.0–5.0)
HCT: 44.1 % (ref 39.0–52.0)
Hemoglobin: 14.2 g/dL (ref 13.0–17.0)
Lymphocytes Relative: 31.2 % (ref 12.0–46.0)
Lymphs Abs: 1.9 10*3/uL (ref 0.7–4.0)
MCHC: 32.2 g/dL (ref 30.0–36.0)
MCV: 92.5 fl (ref 78.0–100.0)
Monocytes Absolute: 0.8 10*3/uL (ref 0.1–1.0)
Monocytes Relative: 13.2 % — ABNORMAL HIGH (ref 3.0–12.0)
Neutro Abs: 3 10*3/uL (ref 1.4–7.7)
Neutrophils Relative %: 49.2 % (ref 43.0–77.0)
Platelets: 262 10*3/uL (ref 150.0–400.0)
RBC: 4.77 Mil/uL (ref 4.22–5.81)
RDW: 14.2 % (ref 11.5–15.5)
WBC: 6.1 10*3/uL (ref 4.0–10.5)

## 2022-08-03 LAB — TSH: TSH: 2.72 u[IU]/mL (ref 0.35–5.50)

## 2022-08-03 LAB — URIC ACID: Uric Acid, Serum: 9.4 mg/dL — ABNORMAL HIGH (ref 4.0–7.8)

## 2022-08-03 NOTE — Patient Instructions (Signed)

## 2022-08-03 NOTE — Progress Notes (Signed)
HPI: FU coronary artery disease. 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 Cath revealed normal LM. Left anterior descending artery had 30% lesion in the proximal and mid-vessel. Lcx with 60 % lesion. There were left-to-right collaterals to the RCA. Right coronary artery was dominant and was 100% midvessel occlusion after the takeoff of a medium-sized RV branch; inferobasal wall akinesis. EF was 50%. He underwent PCI of the right coronary artery with bare-metal stent. EF was 50%. Stress echocardiogram in February 2013 was normal. Carotid Dopplers June 2018 showed 1 to 39% bilateral stenosis.  Since last seen, the patient denies any dyspnea on exertion, orthopnea, PND, pedal edema, palpitations, syncope or chest pain.   Current Outpatient Medications  Medication Sig Dispense Refill   allopurinol (ZYLOPRIM) 300 MG tablet Take 1 tablet (300 mg total) by mouth daily. 90 tablet 1   aspirin EC 81 MG tablet Take 1 tablet (81 mg total) by mouth daily. Swallow whole. 90 tablet 3   atorvastatin (LIPITOR) 80 MG tablet TAKE 1 TABLET DAILY AT 6 P.M. (SCHEDULE AN APPOINTMENT FOR FUTURE REFILLS) 60 tablet 5   budesonide (PULMICORT) 0.5 MG/2ML nebulizer solution Take 8 mLs (2 mg total) by nebulization at bedtime. 240 mL 1   buPROPion (WELLBUTRIN XL) 300 MG 24 hr tablet Take 1 tablet (300 mg total) by mouth daily. 90 tablet 3   cetirizine (ZYRTEC) 10 MG tablet Take 10 mg by mouth daily as needed for allergies.     clobetasol ointment (TEMOVATE) 0.05 % Apply 1 application topically 2 (two) times daily as needed (psoriasis). 60 g 2   clotrimazole (LOTRIMIN) 1 % cream Apply topically 2 (two) times daily.     ezetimibe (ZETIA) 10 MG tablet Take 1 tablet (10 mg total) by mouth daily. 90 tablet 3   hydrocortisone 2.5 % cream Apply topically 2 (two) times daily.     indomethacin (INDOCIN) 50 MG capsule TAKE 1 CAPSULE TWICE A DAY AS NEEDED 180 capsule 3   lansoprazole (PREVACID) 30 MG capsule  Take 1 capsule (30 mg total) by mouth 2 (two) times daily before a meal. 60 capsule 2   losartan (COZAAR) 50 MG tablet TAKE 1 TABLET DAILY 90 tablet 0   Melatonin 10 MG CAPS Take 10 mg by mouth at bedtime as needed (sleep).     Meloxicam 15 MG TBDP Take by mouth as needed.     metoprolol succinate (TOPROL-XL) 50 MG 24 hr tablet TAKE 1 TABLET DAILY, TAKE WITH OR IMMEDIATELY FOLLOWING A MEAL 90 tablet 0   Multiple Vitamin (MULTIVITAMIN WITH MINERALS) TABS tablet Take 1 tablet by mouth daily.     mupirocin ointment (BACTROBAN) 2 % Place 1 Application into the nose 2 (two) times daily. 30 g 1   zolpidem (AMBIEN) 10 MG tablet TAKE 1/2 TO 1 TABLET(5 TO 10 MG) BY MOUTH AT BEDTIME AS NEEDED FOR SLEEP 30 tablet 5   No current facility-administered medications for this visit.     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   Kidney stone on left side 09/19/2013   Left knee pain 12/25/2012   Mumps as a child   Onychomycosis 09/30/2014   Overweight 12/16/2015   Preventative health care 09/24/2013    Past Surgical History:  Procedure Laterality Date   COLONOSCOPY     COLONOSCOPY WITH PROPOFOL N/A 12/04/2019   Procedure: COLONOSCOPY WITH PROPOFOL;  Surgeon: Meridee Score [name], MD;  Location: [facility] ENDOSCOPY;  Service: Gastroenterology;  Laterality: N/A;  ultra slim scope    CORONARY STENT PLACEMENT  01/14/2005   FLEXIBLE BRONCHOSCOPY W/ UPPER ENDOSCOPY  11/03/2021   LITHOTRIPSY  03/2014   POLYPECTOMY  12/04/2019   Procedure: POLYPECTOMY;  Surgeon:  [name], [name], MD;  Location: [facility] ENDOSCOPY;  Service: Gastroenterology;;   UPPER GASTROINTESTINAL ENDOSCOPY  09/12/2019   [facility]    Social History   Socioeconomic History   Marital status: Married    Spouse name: Not on file   Number of children: 2   Years of education: Not on file   Highest education level: Bachelor's degree (e.g., BA, AB, BS)  Occupational History   Occupation: Technical sales engineer  Tobacco Use   Smoking status: Former    Packs/day: 0.50    Years: 20.00    Additional pack years: 0.00    Total pack years: 10.00    Types: Cigarettes    Quit date: 02/20/2015    Years since quitting: 7.4   Smokeless tobacco: Never  Vaping Use   Vaping Use: Former  Substance and Sexual Activity   Alcohol use: Yes    Alcohol/week: 2.0 standard drinks of alcohol    Types: 2 Standard drinks or equivalent per week    Comment: 2-3 per week   Drug use: No   Sexual activity: Yes    Partners: Female    Comment: wife, real Psychologist, occupational, no dairy, minimal fried foods  Other Topics Concern   Not on file  Social History Narrative   1 biological son   1 adopted child   3 step children   Social Determinants of Health   Financial Resource Strain: Low Risk  (07/27/2022)   Overall Financial Resource Strain (CARDIA)    Difficulty of Paying Living Expenses: Not hard at all  Food Insecurity: No Food Insecurity (07/27/2022)   Hunger Vital Sign    Worried About Running Out of Food in the Last Year: Never true    Ran Out of Food in the Last Year: Never true  Transportation Needs: No Transportation Needs (07/27/2022)   PRAPARE - Administrator, Civil Service (Medical): No    Lack of Transportation (Non-Medical): No  Physical Activity: Sufficiently Active (07/27/2022)   Exercise Vital Sign    Days of Exercise per Week: 4 days    Minutes of Exercise per Session: 120 min  Stress: Stress Concern Present (07/27/2022)   [employer] of Occupational Health -  Occupational Stress Questionnaire    Feeling of Stress : To some extent  Social Connections: Moderately Integrated (07/27/2022)   Social Connection and Isolation Panel [NHANES]    Frequency of Communication with Friends and Family: More than three times a week    Frequency of Social Gatherings with Friends and Family: More than three times a week    Attends Religious Services: More than 4 times per year    Active Member of [employer] or Organizations: No    Attends Banker Meetings: Not on file  Marital Status: Married  Catering manager Violence: Not on file    Family History  Problem Relation Age of Onset   Multiple sclerosis Mother    Cancer Father        esophagus/ brain   Alcohol abuse Father        smoker   Esophageal cancer Father    Tremor Father    Seizures Sister        AVM   COPD Sister    Alcohol abuse Sister        addiction, recovered   Tremor Son    Cancer Paternal Aunt        leukemia   Heart disease Maternal Grandfather        MI   Colon cancer Neg Hx    Colon polyps Neg Hx    Rectal cancer Neg Hx    Stomach cancer Neg Hx     ROS: no fevers or chills, productive cough, hemoptysis, dysphasia, odynophagia, melena, hematochezia, dysuria, hematuria, rash, seizure activity, orthopnea, PND, pedal edema, claudication. Remaining systems are negative.  Physical Exam: Well-developed well-nourished in no acute distress.  Skin is warm and dry.  HEENT is normal.  Neck is supple.  Chest is clear to auscultation with normal expansion.  Cardiovascular exam is regular rate and rhythm.  Abdominal exam nontender or distended. No masses palpated. Extremities show no edema. neuro grossly intact  EKG Interpretation Date/Time:  Wednesday August 12 2022 10:23:29 EDT Ventricular Rate:  69 PR Interval:  156 QRS Duration:  104 QT Interval:  402 QTC Calculation: 430 R Axis:   15  Text Interpretation: Normal sinus rhythm Possible Inferior infarct (cited on or  before 14-Jan-2005) When compared with ECG of 15-Jan-2005 07:03, Questionable change in QRS duration Confirmed by Olga Millers ([id number]) on 08/12/2022 10:28:23 AM    A/P  1 coronary artery disease-patient denies chest pain.  Continue medical therapy with aspirin and statin.  2 hypertension-patient's blood pressure is controlled.  Continue present medical regimen.  3 hyperlipidemia-continue statin.  Most recent LDL not at goal but he has not taken his Zetia and is not taking his atorvastatin every day.  I have asked him to resume both on a daily basis.  Will check lipids and liver in 8 weeks.  4 carotid artery disease-mild on most recent Dopplers.  Olga Millers, MD

## 2022-08-04 LAB — DRUG MONITORING PANEL 376104, URINE
Amphetamines: NEGATIVE ng/mL (ref <500)
Barbiturates: NEGATIVE ng/mL (ref <300)
Benzodiazepines: NEGATIVE ng/mL (ref <100)
Cocaine Metabolite: NEGATIVE ng/mL (ref <150)
Desmethyltramadol: NEGATIVE ng/mL (ref <100)
Opiates: NEGATIVE ng/mL (ref <100)
Oxycodone: NEGATIVE ng/mL (ref <100)
Tramadol: NEGATIVE ng/mL (ref <100)

## 2022-08-04 LAB — DM TEMPLATE

## 2022-08-04 MED ORDER — ALLOPURINOL 300 MG PO TABS: 300.0000 mg | ORAL_TABLET | Freq: Every day | ORAL | 1 refills | Status: DC

## 2022-08-04 NOTE — Addendum Note (Signed)
Addended byConrad Oldham D on: 08/04/2022 11:09 AM   Modules accepted: Orders

## 2022-08-10 ENCOUNTER — Encounter: Payer: Self-pay | Admitting: Family Medicine

## 2022-08-10 ENCOUNTER — Other Ambulatory Visit: Payer: Self-pay | Admitting: Family Medicine

## 2022-08-10 MED ORDER — MUPIROCIN 2 % EX OINT: 1.0000 | TOPICAL_OINTMENT | Freq: Two times a day (BID) | CUTANEOUS | 1 refills | Status: AC

## 2022-08-12 ENCOUNTER — Ambulatory Visit: Payer: Managed Care, Other (non HMO) | Attending: Cardiology | Admitting: Cardiology

## 2022-08-12 ENCOUNTER — Encounter: Payer: Self-pay | Admitting: Cardiology

## 2022-08-12 VITALS — BP 130/82 | HR 69 | Ht 71.0 in | Wt 198.8 lb

## 2022-08-12 DIAGNOSIS — E7800 Pure hypercholesterolemia, unspecified: Secondary | ICD-10-CM

## 2022-08-12 DIAGNOSIS — I679 Cerebrovascular disease, unspecified: Secondary | ICD-10-CM | POA: Diagnosis not present

## 2022-08-12 DIAGNOSIS — I10 Essential (primary) hypertension: Secondary | ICD-10-CM | POA: Diagnosis not present

## 2022-08-12 DIAGNOSIS — I2510 Atherosclerotic heart disease of native coronary artery without angina pectoris: Secondary | ICD-10-CM

## 2022-08-12 MED ORDER — EZETIMIBE 10 MG PO TABS
10.0000 mg | ORAL_TABLET | Freq: Every day | ORAL | 3 refills | Status: DC
Start: 2022-08-12 — End: 2023-12-29

## 2022-08-12 NOTE — Patient Instructions (Signed)
Medication Instructions:   RESTART ATORVASTATIN AND EZETIMIBE ONCE DAILY  *If you need a refill on your cardiac medications before your next appointment, please call your pharmacy*   Lab Work:  Your physician recommends that you return for lab work in: [address] [facility]  [employer]  Located on the 3 rd floor in [address] Hours-Monday - Friday 8 am-11:30 AM  and 1 pm -4 pm    If you have labs (blood work) drawn today and your tests are completely normal, you will receive your results only by: MyChart Message (if you have MyChart) OR A paper copy in the mail If you have any lab test that is abnormal or we need to change your treatment, we will call you to review the results.    Follow-Up: At [facility], you and your health needs are our priority.  As part of our continuing mission to provide you with exceptional heart care, we have created designated Provider Care Teams.  These Care Teams include your primary Cardiologist (physician) and Advanced Practice Providers (APPs -  Physician Assistants and Nurse Practitioners) who all work together to provide you with the care you need, when you need it.  We recommend signing up for the patient portal called "MyChart".  Sign up information is provided on this After Visit Summary.  MyChart is used to connect with patients for Virtual Visits (Telemedicine).  Patients are able to view lab/test results, encounter notes, upcoming appointments, etc.  Non-urgent messages can be sent to your provider as well.   To learn more about what you can do with MyChart, go to [email or web address].    Your next appointment:   12 month(s)  Provider:   Olga Millers, MD

## 2022-08-18 ENCOUNTER — Ambulatory Visit: Payer: Managed Care, Other (non HMO) | Admitting: Urology

## 2022-08-18 ENCOUNTER — Encounter: Payer: Self-pay | Admitting: Urology

## 2022-08-18 VITALS — BP 137/92 | HR 73 | Ht 72.0 in | Wt 195.0 lb

## 2022-08-18 DIAGNOSIS — N4889 Other specified disorders of penis: Secondary | ICD-10-CM

## 2022-08-18 NOTE — Progress Notes (Signed)
Assessment: 1. Penile curvature, acquired      Plan: Today I had a long discussion with the patient regarding his mild penile curvature.  I discussed with him the implications of Peyronie's disease including its natural history and treatment options.  Right now he has very mild curvature and would not recommend any kind of active intervention.  He will let us know if it worsens. Follow-up as needed  Chief Complaint: penile curvature   History of Present Illness:  Dakota [name] is a 62 y.o. male who is seen in consultation from Bradd Canary, MD for evaluation of penile curvature.  Patient states that approximately a year ago he noticed some distal ventral penile curvature.  He does not have ED.  The patient showed a photograph today of his erect penis which shows a mild ventral curvature distally of approximately 15 degrees.   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   Kidney stone on left side 09/19/2013   Left knee pain 12/25/2012   Mumps as a child   Onychomycosis 09/30/2014   Overweight 12/16/2015   Preventative health care 09/24/2013    Past Surgical History:  Past Surgical History:  Procedure Laterality Date   COLONOSCOPY     COLONOSCOPY WITH PROPOFOL N/A 12/04/2019   Procedure: COLONOSCOPY WITH PROPOFOL;  Surgeon: Lemar Lofty., MD;  Location: [facility] ENDOSCOPY;  Service: Gastroenterology;  Laterality: N/A;  ultra slim scope    CORONARY STENT PLACEMENT  01/14/2005   FLEXIBLE BRONCHOSCOPY W/  UPPER ENDOSCOPY  11/03/2021   LITHOTRIPSY  03/2014   POLYPECTOMY  12/04/2019   Procedure: POLYPECTOMY;  Surgeon: Meridee Score [name], MD;  Location: [facility] ENDOSCOPY;  Service: Gastroenterology;;   UPPER GASTROINTESTINAL ENDOSCOPY  09/12/2019   [facility]    Allergies:  Allergies  Allergen Reactions   Neosporin [Neomycin-Bacitracin Zn-Polymyx] Hives and Itching   Polysporin [Bacitracin-Polymyxin B] Hives and Itching   Amoxicillin Itching and Rash    Family History:  Family History  Problem Relation Age of Onset   Multiple sclerosis Mother    Cancer Father        esophagus/ brain   Alcohol abuse Father        smoker   Esophageal cancer Father    Tremor Father    Seizures Sister        AVM   COPD Sister    Alcohol abuse Sister        addiction, recovered   Tremor Son    Cancer Paternal Aunt        leukemia   Heart disease Maternal Grandfather        MI   Colon cancer Neg Hx    Colon polyps Neg Hx    Rectal cancer Neg Hx    Stomach cancer Neg Hx     Social History:  Social History   Tobacco Use   Smoking status: Former    Current packs/day: 0.00  Average packs/day: 0.5 packs/day for 20.0 years (10.0 ttl pk-yrs)    Types: Cigarettes    Start date: 02/20/1995    Quit date: 02/20/2015    Years since quitting: 7.4   Smokeless tobacco: Never  Vaping Use   Vaping status: Former  Substance Use Topics   Alcohol use: Yes    Alcohol/week: 2.0 standard drinks of alcohol    Types: 2 Standard drinks or equivalent per week    Comment: 2-3 per week   Drug use: No    Review of symptoms:  Constitutional:  Negative for unexplained weight loss, night sweats, fever, chills ENT:  Negative for nose bleeds, sinus pain, painful swallowing CV:  Negative for chest pain, shortness of breath, exercise intolerance, palpitations, loss of consciousness Resp:  Negative for cough, wheezing, shortness of breath GI:  Negative for nausea, vomiting, diarrhea, bloody stools GU:  Positives  noted in HPI; otherwise negative for gross hematuria, dysuria, urinary incontinence Neuro:  Negative for seizures, poor balance, limb weakness, slurred speech Psych:  Negative for lack of energy, depression, anxiety Endocrine:  Negative for polydipsia, polyuria, symptoms of hypoglycemia (dizziness, hunger, sweating) Hematologic:  Negative for anemia, purpura, petechia, prolonged or excessive bleeding, use of anticoagulants  Allergic:  Negative for difficulty breathing or choking as a result of exposure to anything; no shellfish allergy; no allergic response (rash/itch) to materials, foods  Physical exam: BP (!) 137/92   Pulse 73   Ht 6' (1.829 m)   Wt 195 lb (88.5 kg)   BMI 26.45 kg/m  GENERAL APPEARANCE:  Well appearing, well developed, well nourished, NAD  GU: Circumcised phallus with very distal dorsal plaque just proximal to the glans.

## 2022-09-25 ENCOUNTER — Other Ambulatory Visit: Payer: Self-pay | Admitting: Family Medicine

## 2022-09-25 ENCOUNTER — Encounter: Payer: Self-pay | Admitting: Family Medicine

## 2022-09-25 DIAGNOSIS — L409 Psoriasis, unspecified: Secondary | ICD-10-CM

## 2022-09-25 MED ORDER — METHYLPREDNISOLONE 4 MG PO TABS
ORAL_TABLET | ORAL | 0 refills | Status: DC
Start: 2022-09-25 — End: 2022-12-30

## 2022-09-28 ENCOUNTER — Other Ambulatory Visit: Payer: Self-pay | Admitting: Cardiology

## 2022-09-29 ENCOUNTER — Other Ambulatory Visit: Payer: Self-pay | Admitting: Cardiology

## 2022-10-08 ENCOUNTER — Encounter: Payer: Self-pay | Admitting: Family Medicine

## 2022-10-09 ENCOUNTER — Ambulatory Visit: Payer: Managed Care, Other (non HMO) | Admitting: Family

## 2022-10-09 VITALS — BP 128/86 | HR 72 | Temp 97.7°F | Resp 16 | Ht 72.0 in | Wt 197.0 lb

## 2022-10-09 DIAGNOSIS — E782 Mixed hyperlipidemia: Secondary | ICD-10-CM | POA: Diagnosis not present

## 2022-10-09 DIAGNOSIS — W57XXXA Bitten or stung by nonvenomous insect and other nonvenomous arthropods, initial encounter: Secondary | ICD-10-CM | POA: Diagnosis not present

## 2022-10-09 DIAGNOSIS — S80869A Insect bite (nonvenomous), unspecified lower leg, initial encounter: Secondary | ICD-10-CM | POA: Diagnosis not present

## 2022-10-09 MED ORDER — DESONIDE 0.05 % EX OINT: 1.0000 | TOPICAL_OINTMENT | Freq: Two times a day (BID) | CUTANEOUS | 0 refills | Status: AC

## 2022-10-12 ENCOUNTER — Other Ambulatory Visit (INDEPENDENT_AMBULATORY_CARE_PROVIDER_SITE_OTHER): Payer: Managed Care, Other (non HMO)

## 2022-10-12 DIAGNOSIS — E782 Mixed hyperlipidemia: Secondary | ICD-10-CM | POA: Diagnosis not present

## 2022-10-12 DIAGNOSIS — W57XXXA Bitten or stung by nonvenomous insect and other nonvenomous arthropods, initial encounter: Secondary | ICD-10-CM | POA: Insufficient documentation

## 2022-10-12 DIAGNOSIS — S80869A Insect bite (nonvenomous), unspecified lower leg, initial encounter: Secondary | ICD-10-CM | POA: Insufficient documentation

## 2022-10-12 LAB — LIPID PANEL
Cholesterol: 122 mg/dL (ref 0–200)
HDL: 50.1 mg/dL (ref >39.00)
LDL Cholesterol: 50 mg/dL (ref 0–99)
NonHDL: 71.88
Total CHOL/HDL Ratio: 2
Triglycerides: 108 mg/dL (ref 0.0–149.0)
VLDL: 21.6 mg/dL (ref 0.0–40.0)

## 2022-10-12 LAB — HEPATIC FUNCTION PANEL
ALT: 24 U/L (ref 0–53)
AST: 23 U/L (ref 0–37)
Albumin: 4.2 g/dL (ref 3.5–5.2)
Alkaline Phosphatase: 104 U/L (ref 39–117)
Bilirubin, Direct: 0.1 mg/dL (ref 0.0–0.3)
Total Bilirubin: 0.6 mg/dL (ref 0.2–1.2)
Total Protein: 7.3 g/dL (ref 6.0–8.3)

## 2022-10-12 NOTE — Progress Notes (Signed)
Subjective:     Patient ID: Dakota [name], male    DOB: [birth year], 62 y.o.   MRN: [id number]  Chief Complaint  Patient presents with   Insect Bite    Patient reports some possible insect bites    HPI  Discussed the use of AI scribe software for clinical note transcription with the patient, who gave verbal consent to proceed.  History of Present Illness   The patient, with a history of psoriasis and sensitive skin, presents with multiple skin bites of unknown origin on his lower extremities. He first noticed the bites on Saturday after spending time outdoors. The bites are described as 'itching and sore,' with two previously having whiteheads. The patient has been managing the discomfort with over-the-counter hydrocortisone and antihistamine creams, which have reduced the itching but not the pain on touch.          Health Maintenance Due  Topic Date Due   DTaP/Tdap/Td (1 - Tdap) Never done   INFLUENZA VACCINE  09/03/2022   COVID-19 Vaccine (6 - 2023-24 season) 10/04/2022    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   Kidney stone on left side 09/19/2013   Left knee pain 12/25/2012   Mumps as a child   Onychomycosis 09/30/2014   Overweight 12/16/2015   Preventative health care 09/24/2013    Past Surgical History:  Procedure Laterality Date   COLONOSCOPY     COLONOSCOPY WITH PROPOFOL N/A 12/04/2019   Procedure: COLONOSCOPY WITH PROPOFOL;  Surgeon: Lemar Lofty., MD;  Location: [facility] ENDOSCOPY;  Service:  Gastroenterology;  Laterality: N/A;  ultra slim scope    CORONARY STENT PLACEMENT  01/14/2005   FLEXIBLE BRONCHOSCOPY W/ UPPER ENDOSCOPY  11/03/2021   LITHOTRIPSY  03/2014   POLYPECTOMY  12/04/2019   Procedure: POLYPECTOMY;  Surgeon: [name], [name], MD;  Location: [facility] ENDOSCOPY;  Service: Gastroenterology;;   UPPER GASTROINTESTINAL ENDOSCOPY  09/12/2019   Danis    Family History  Problem Relation Age of Onset   Multiple sclerosis Mother    Cancer Father        esophagus/ brain   Alcohol abuse Father        smoker   Esophageal cancer Father    Tremor Father    Seizures Sister        AVM   COPD Sister    Alcohol abuse Sister        addiction, recovered   Tremor Son    Cancer Paternal Aunt        leukemia   Heart disease Maternal Grandfather        MI   Colon cancer Neg Hx    Colon polyps Neg Hx    Rectal cancer Neg Hx    Stomach cancer Neg Hx     Social History   Socioeconomic History   Marital status: Married    Spouse name: Not on file   Number of children: 2  Years of education: Not on file   Highest education level: Bachelor's degree (e.g., BA, AB, BS)  Occupational History   Occupation: Technical sales engineer  Tobacco Use   Smoking status: Former    Current packs/day: 0.00    Average packs/day: 0.5 packs/day for 20.0 years (10.0 ttl pk-yrs)    Types: Cigarettes    Start date: 02/20/1995    Quit date: 02/20/2015    Years since quitting: 7.6   Smokeless tobacco: Never  Vaping Use   Vaping status: Former  Substance and Sexual Activity   Alcohol use: Yes    Alcohol/week: 2.0 standard drinks of alcohol    Types: 2 Standard drinks or equivalent per week    Comment: 2-3 per week   Drug use: No   Sexual activity: Yes    Partners: Female    Comment: wife, real Psychologist, occupational, no dairy, minimal fried foods  Other Topics Concern   Not on file  Social History Narrative   1 biological son   1 adopted child   3 step children   Social Determinants  of Health   Financial Resource Strain: Low Risk  (07/27/2022)   Overall Financial Resource Strain (CARDIA)    Difficulty of Paying Living Expenses: Not hard at all  Food Insecurity: No Food Insecurity (07/27/2022)   Hunger Vital Sign    Worried About Running Out of Food in the Last Year: Never true    Ran Out of Food in the Last Year: Never true  Transportation Needs: No Transportation Needs (07/27/2022)   PRAPARE - Administrator, Civil Service (Medical): No    Lack of Transportation (Non-Medical): No  Physical Activity: Sufficiently Active (07/27/2022)   Exercise Vital Sign    Days of Exercise per Week: 4 days    Minutes of Exercise per Session: 120 min  Stress: Stress Concern Present (07/27/2022)   [employer] of Occupational Health - Occupational Stress Questionnaire    Feeling of Stress : To some extent  Social Connections: Moderately Integrated (07/27/2022)   Social Connection and Isolation Panel [NHANES]    Frequency of Communication with Friends and Family: More than three times a week    Frequency of Social Gatherings with Friends and Family: More than three times a week    Attends Religious Services: More than 4 times per year    Active Member of [employer] or Organizations: No    Attends Engineer, structural: Not on file    Marital Status: Married  Intimate Partner Violence: Unknown (05/09/2021)   Received from [employer]   HITS    Physically Hurt: Not on file    Insult or Talk Down To: Not on file    Threaten Physical Harm: Not on file    Scream or Curse: Not on file    Outpatient Medications Prior to Visit  Medication Sig Dispense Refill   allopurinol (ZYLOPRIM) 300 MG tablet Take 1 tablet (300 mg total) by mouth daily. 90 tablet 1   aspirin EC 81 MG tablet Take 1 tablet (81 mg total) by mouth daily. Swallow whole. 90 tablet 3   atorvastatin (LIPITOR) 80 MG tablet TAKE 1 TABLET DAILY AT 6 P.M. (SCHEDULE AN APPOINTMENT FOR FUTURE  REFILLS) 60 tablet 5   budesonide (PULMICORT) 0.5 MG/2ML nebulizer solution Take 8 mLs (2 mg total) by nebulization at bedtime. 240 mL 1   buPROPion (WELLBUTRIN XL) 300 MG 24 hr tablet Take 1 tablet (300 mg total) by mouth  daily. 90 tablet 3   cetirizine (ZYRTEC) 10 MG tablet Take 10 mg by mouth daily as needed for allergies.     clobetasol ointment (TEMOVATE) 0.05 % Apply 1 application topically 2 (two) times daily as needed (psoriasis). 60 g 2   clotrimazole (LOTRIMIN) 1 % cream Apply topically 2 (two) times daily.     ezetimibe (ZETIA) 10 MG tablet Take 1 tablet (10 mg total) by mouth daily. 90 tablet 3   hydrocortisone 2.5 % cream Apply topically 2 (two) times daily.     indomethacin (INDOCIN) 50 MG capsule TAKE 1 CAPSULE TWICE A DAY AS NEEDED 180 capsule 3   lansoprazole (PREVACID) 30 MG capsule Take 1 capsule (30 mg total) by mouth 2 (two) times daily before a meal. 60 capsule 2   losartan (COZAAR) 50 MG tablet TAKE 1 TABLET DAILY 90 tablet 3   Melatonin 10 MG CAPS Take 10 mg by mouth at bedtime as needed (sleep).     Meloxicam 15 MG TBDP Take by mouth as needed.     methylPREDNISolone (MEDROL) 4 MG tablet 5 tab po qd X 1d then 4 tab po qd X 1d then 3 tab po qd X 1d then 2 tab po qd then 1 tab po qd 15 tablet 0   metoprolol succinate (TOPROL-XL) 50 MG 24 hr tablet Take 1 tablet (50 mg total) by mouth daily. 90 tablet 3   Multiple Vitamin (MULTIVITAMIN WITH MINERALS) TABS tablet Take 1 tablet by mouth daily.     mupirocin ointment (BACTROBAN) 2 % Place 1 Application into the nose 2 (two) times daily. 30 g 1   zolpidem (AMBIEN) 10 MG tablet TAKE 1/2 TO 1 TABLET(5 TO 10 MG) BY MOUTH AT BEDTIME AS NEEDED FOR SLEEP 30 tablet 5   No facility-administered medications prior to visit.    Allergies  Allergen Reactions   Neosporin [Neomycin-Bacitracin Zn-Polymyx] Hives and Itching   Polysporin [Bacitracin-Polymyxin B] Hives and Itching   Amoxicillin Itching and Rash    ROS See HPI     Objective:    Physical Exam Constitutional:      General: He is not in acute distress.    Appearance: He is well-developed.  HENT:     Head: Normocephalic and atraumatic.  Cardiovascular:     Rate and Rhythm: Normal rate and regular rhythm.     Heart sounds: No murmur heard. Pulmonary:     Effort: Pulmonary effort is normal. No respiratory distress.     Breath sounds: Normal breath sounds. No wheezing or rales.  Skin:    General: Skin is warm and dry.     Comments: Multiple insect bites noted bilateral lower legs  Neurological:     Mental Status: He is alert and oriented to person, place, and time.  Psychiatric:        Behavior: Behavior normal.        Thought Content: Thought content normal.      BP 128/86 (BP Location: Right Arm, Patient Position: Sitting, Cuff Size: Small)   Pulse 72   Temp 97.7 F (36.5 C) (Oral)   Resp 16   Ht 6' (1.829 m)   Wt 197 lb (89.4 kg)   SpO2 97%   BMI 26.72 kg/m  Wt Readings from Last 3 Encounters:  10/09/22 197 lb (89.4 kg)  08/18/22 195 lb (88.5 kg)  08/12/22 198 lb 12.8 oz (90.2 kg)        Assessment & Plan:   Problem List Items Addressed  This Visit       Unprioritized   Insect bite of lower leg    Multiple bites on lower extremities with itching and discomfort. No signs of infection. Patient has been using over-the-counter hydrocortisone and antihistamine creams with some relief. -Prescribe stronger steroid cream for application twice daily as needed. -Advise patient to monitor for signs of infection (increased redness, swelling, pus).      Hyperlipidemia, mixed - Primary    He is requesting to have LFT/lipid panel drawn today for his cardiologist.       Relevant Orders   Lipid panel   Hepatic function panel    I am having Dakota [name] start on desonide. I am also having him maintain his multivitamin with minerals, cetirizine, Melatonin, aspirin EC, clobetasol ointment, indomethacin, clotrimazole,  hydrocortisone, Meloxicam, atorvastatin, lansoprazole, budesonide, buPROPion, zolpidem, allopurinol, mupirocin ointment, ezetimibe, methylPREDNISolone, losartan, and metoprolol succinate.  Meds ordered this encounter  Medications   desonide (DESOWEN) 0.05 % ointment    Sig: Apply 1 Application topically 2 (two) times daily.    Dispense:  15 g    Refill:  0    Order Specific Question:   Supervising Provider    Answer:   Danise Edge A [[id number]]

## 2022-10-12 NOTE — Patient Instructions (Signed)
VISIT SUMMARY:  During your visit, we discussed your recent insect bites and your ongoing management of high cholesterol. The insect bites are causing you discomfort and itching, but there are no signs of infection. You have been using over-the-counter creams to manage the symptoms. We also discussed your high cholesterol levels, which you have been managing with medication.  YOUR PLAN:  -INSECT BITES: Insect bites are small wounds caused by insects. They can cause itching and discomfort. To help manage your symptoms, I am prescribing a stronger steroid cream that you can apply twice daily as needed. Please monitor the bites for signs of infection, such as increased redness, swelling, or pus.  -HIGH CHOLESTEROL: High cholesterol means you have too much of a certain type of fat in your blood. This can increase your risk of heart disease. We will order cholesterol and liver function tests at our lab to monitor your levels. Once the results are available, they will be forwarded to Dr. Jens Som.  INSTRUCTIONS:  Please apply the prescribed steroid cream to your insect bites twice daily or as needed. Monitor the bites for signs of infection. We will contact you when your cholesterol and liver function test results are available.

## 2022-10-12 NOTE — Assessment & Plan Note (Signed)
He is requesting to have LFT/lipid panel drawn today for his cardiologist.

## 2022-10-12 NOTE — Addendum Note (Signed)
Addended by: Mervin Kung A on: 10/12/2022 11:01 AM   Modules accepted: Orders

## 2022-10-12 NOTE — Assessment & Plan Note (Signed)
Multiple bites on lower extremities with itching and discomfort. No signs of infection. Patient has been using over-the-counter hydrocortisone and antihistamine creams with some relief. -Prescribe stronger steroid cream for application twice daily as needed. -Advise patient to monitor for signs of infection (increased redness, swelling, pus).

## 2022-10-13 ENCOUNTER — Encounter: Payer: Self-pay | Admitting: Cardiology

## 2022-11-13 ENCOUNTER — Encounter: Payer: Self-pay | Admitting: Gastroenterology

## 2022-11-18 NOTE — Telephone Encounter (Signed)
Yes, it can wait until January or February.  Routine polyp surveillance.  Appreciate you  [name]

## 2022-11-20 ENCOUNTER — Telehealth: Payer: Self-pay

## 2022-11-20 NOTE — Telephone Encounter (Signed)
Dakota [name], Dakota Starring., Dakota [name]  You; Dakota [name], MD4 hours ago (4:21 AM)    [name], Colonoscopy 1 hour time slot with abdominal binder/Colowrap in January or February. May use same bowel preparation as last colonoscopy. Thanks. GM      Note   Sherrilyn Rist, Dakota [name]  Dakota [name], Dakota Starring., MD2 days ago    Yes, it can wait until January or February.  Routine polyp surveillance.   Appreciate you   [name]      Note   Dakota [name], Dakota Starring., Dakota [name]  Dakota [name], MD3 days ago    HD, I can certainly try to be available. However at this point for a nonurgent procedure such as this and based on my caseload (I filled all my advanced case slots 8 hours left in all of October and November), I would not plan to have availability until January or February. If this is reasonable, we can work on getting him scheduled. Otherwise I will defer if you want to pursue the procedure sooner or send him out. Let me know. GM   Sherrilyn Rist, Dakota [name]  Dakota [name], Dakota Starring., MD3 days ago    [name],  Nonurgent question. I think it would be best if you are agreeable to doing his surveillance colonoscopy again given the technical challenges navigating the left colon.  Let me know which you think, and I have let the patient know that I am consulting with you.  HD   Sherrilyn Rist, Dakota [name]  Dakota [name] days ago    Dakota [name],   Thanks for the note, and I understand your concerns. Some physicians are still working on getting through their November recall chart reviews.  I we will communicate with my partner Dr. Meridee Score who did your 2021 exam and get his thoughts.   Thanks a lot   Dr. [name], [name], RN routed conversation to Sherrilyn Rist, MD4 days ago   Dakota [name]  P Lgi Clinical Pool (supporting Dakota Pitter III, Dakota [name])7 days ago    [name], Last time I had a colonoscopy was November 2021. At that time you recommended that I get one every 3 years so wanted to  see if you think I should get one before the end of this year. 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 Thanks, Dakota [name]

## 2022-11-20 NOTE — Telephone Encounter (Signed)
Pt will be called Jan/Feb for procedure

## 2022-11-20 NOTE — Telephone Encounter (Signed)
Patty, Colonoscopy 1 hour time slot with abdominal binder/Colowrap in January or February. May use same bowel preparation as last colonoscopy. Thanks. GM

## 2022-11-30 ENCOUNTER — Encounter: Payer: Self-pay | Admitting: *Deleted

## 2022-12-29 ENCOUNTER — Encounter: Payer: Self-pay | Admitting: Family Medicine

## 2022-12-29 ENCOUNTER — Ambulatory Visit: Payer: Managed Care, Other (non HMO) | Admitting: Family Medicine

## 2022-12-29 ENCOUNTER — Telehealth: Payer: Self-pay

## 2022-12-29 ENCOUNTER — Other Ambulatory Visit: Payer: Self-pay

## 2022-12-29 VITALS — BP 118/80 | Ht 72.0 in | Wt 197.0 lb

## 2022-12-29 DIAGNOSIS — M222X1 Patellofemoral disorders, right knee: Secondary | ICD-10-CM

## 2022-12-29 DIAGNOSIS — Z860100 Personal history of colon polyps, unspecified: Secondary | ICD-10-CM

## 2022-12-29 NOTE — Telephone Encounter (Signed)
Colon has been made for 02/18/23 at [facility] with GM  Left message on machine to call back

## 2022-12-29 NOTE — Progress Notes (Unsigned)
CHIEF COMPLAINT: No chief complaint on file.  _____________________________________________________________ SUBJECTIVE  HPI  Pt is a 62 y.o. male here for evaluation of R knee pain  Ongoing for 2 weeks, no inciting event. Has been working in the yard.   Happened to him before; about 1 year ago  Not excruciating pain. Feels like he is walking like a zombie No catching/locking, but states he feels like the joint could lock up Since imaging 01/2022, knee pain resolved completely in a week or so Numbness/tingling no Pain is deep behind his kneecap Has tried ice, allopurinol for this   MHx: gout in his feet  ------------------------------------------------------------------------------------------------------ 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   Kidney stone on left side 09/19/2013   Left knee pain 12/25/2012   Mumps as a child   Onychomycosis 09/30/2014   Overweight 12/16/2015   Preventative health care 09/24/2013    Past Surgical History:  Procedure Laterality Date   COLONOSCOPY     COLONOSCOPY WITH PROPOFOL N/A 12/04/2019   Procedure: COLONOSCOPY WITH PROPOFOL;  Surgeon: Lemar Lofty., MD;  Location: [facility] ENDOSCOPY;  Service: Gastroenterology;  Laterality: N/A;  ultra slim scope    CORONARY STENT PLACEMENT  01/14/2005   FLEXIBLE BRONCHOSCOPY W/ UPPER ENDOSCOPY  11/03/2021   LITHOTRIPSY  03/2014   POLYPECTOMY  12/04/2019   Procedure: POLYPECTOMY;  Surgeon: [name], [name], MD;  Location: [facility] ENDOSCOPY;   Service: Gastroenterology;;   UPPER GASTROINTESTINAL ENDOSCOPY  09/12/2019   Danis      No outpatient medications have been marked as taking for the 12/29/22 encounter (Appointment) with Burna Forts, MD.    ------------------------------------------------------------------------------------------------------  _____________________________________________________________ OBJECTIVE  PHYSICAL EXAM  There were no vitals filed for this visit. There is no height or weight on file to calculate BMI.   reviewed  General: A+Ox3, no acute distress, well-nourished, appropriate affect CV: pulses 2+ regular, nondiaphoretic, no peripheral edema, cap refill <2sec Lungs: no audible wheezing, non-labored breathing, bilateral chest rise/fall, nontachypneic Skin: warm, well-perfused, non-icteric, no susp lesions or rashes Neuro: no focal deficits. Sensation intact, muscle tone wnl, no atrophy Psych: no signs of depression or anxiety MSK: ***    *** Knee: No swelling or deformity Neg fluid wave, joint milking ROM Flex {Numbers; 0-100:15068}, Ext {NUMBERS; -10-45 JOINT ROM:10287} NTTP over the quad tendon, medial fem condyle, lat fem condyle, patella, plica, patella tendon, tibial tuberostiy, fibular head, posterior fossa, pes anserine bursa, gerdy's tubercle, medial jt line, lateral jt line Neg anterior and posterior drawer Neg lachman Neg sag sign Negative varus stress Negative valgus stress Negative McMurray Negative Thessaly  Gait {GEXB:28413}   01/2022 R knee XR independently reviewed, mild degenerative changes, query proximal fibula enthesophyte. PF and knee joint spacing preserved. insall salvati 1.41 _____________________________________________________________ ASSESSMENT/PLAN There are no diagnoses linked to this encounter.  PT +/- CSI, repeat films Has exercises at home and meloxicam  Summary: ***  Differential Diagnosis:  Treatment:   No follow-ups on  file.  Electronically signed by: Burna Forts, MD 12/29/2022 7:50 AM

## 2022-12-29 NOTE — Telephone Encounter (Signed)
Dakota [name], Colonoscopy 1 hour time slot with abdominal binder/Colowrap in January or February. May use same bowel preparation as last colonoscopy. Thanks. GM

## 2022-12-29 NOTE — Telephone Encounter (Signed)
Colonoscopy (Newest Message First) View All Conversations on this Encounter [name], [name], MD  You1 hour ago (6:06 AM)    [name], Go ahead and schedule for February this patient. GM   Dakota [name]  P Lgi Clinical Pool (supporting [name], MD)2 weeks ago    Thanks Nurse [name], I look forward to hearing from you......    You  Lorenda Ishihara Pearson1 month ago    I will reach out to you as soon as able with appointment for colonoscopy in Jan/Feb.      [name], [name], MD  You; [name], MD1 month ago    [name], Colonoscopy 1 hour time slot with abdominal binder/Colowrap in January or February. May use same bowel preparation as last colonoscopy. Thanks. GM      Note   Sherrilyn Rist, MD  [name], [name], MD1 month ago    Yes, it can wait until January or February.  Routine polyp surveillance.   Appreciate you   [name]      Note   [name], [name], MD  [name], MD1 month ago    HD, I can certainly try to be available. However at this point for a nonurgent procedure such as this and based on my caseload (I filled all my advanced case slots 8 hours left in all of October and November), I would not plan to have availability until January or February. If this is reasonable, we can work on getting him scheduled. Otherwise I will defer if you want to pursue the procedure sooner or send him out. Let me know. GM   Sherrilyn Rist, MD  [name], [name], MD1 month ago    [name],  Nonurgent question. I think it would be best if you are agreeable to doing his surveillance colonoscopy again given the technical challenges navigating the left colon.  Let me know which you think, and I have let the patient know that I am consulting with you.  HD   Sherrilyn Rist, MD  [name] Jordan month ago    Dakota [name],   Thanks for the note, and I understand your concerns. Some physicians are still working on  getting through their November recall chart reviews.  I we will communicate with my partner Dr. Meridee Score who did your 2021 exam and get his thoughts.   Thanks a lot   Dr. [name], [name], RN routed conversation to Sherrilyn Rist, MD1 month ago   [name]  P Lgi Clinical Pool (supporting [name], MD)1 month ago    [name], Last time I had a colonoscopy was November 2021. At that time you recommended that I get one every 3 years so wanted to see if you think I should get one before the end of this year. 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 Thanks, Dakota [name]

## 2022-12-29 NOTE — Telephone Encounter (Signed)
See phone note dated 12/29/22

## 2022-12-30 ENCOUNTER — Other Ambulatory Visit: Payer: Self-pay | Admitting: Family Medicine

## 2022-12-30 DIAGNOSIS — L409 Psoriasis, unspecified: Secondary | ICD-10-CM

## 2022-12-30 MED ORDER — METHYLPREDNISOLONE 4 MG PO TABS
ORAL_TABLET | ORAL | 0 refills | Status: DC
Start: 2022-12-30 — End: 2023-08-12

## 2022-12-30 NOTE — Telephone Encounter (Signed)
Left message on machine to call back  Sent My Chart message as well

## 2023-01-04 NOTE — Telephone Encounter (Signed)
Confirmed the pt did view the colon instructions via My Chart.

## 2023-01-20 ENCOUNTER — Other Ambulatory Visit: Payer: Self-pay | Admitting: Family Medicine

## 2023-01-21 NOTE — Telephone Encounter (Signed)
Requesting: Ambien 10mg   Contract:08/03/22 UDS:08/03/22 Last Visit: 10/09/22 w/ Efraim Kaufmann Next Visit:  02/08/23 Last Refill: 07/21/22 #30 and 5RF  Please Advise

## 2023-02-03 HISTORY — PX: ESOPHAGOGASTRODUODENOSCOPY: SHX1529

## 2023-02-05 ENCOUNTER — Encounter: Payer: Self-pay | Admitting: Gastroenterology

## 2023-02-05 ENCOUNTER — Telehealth: Payer: Managed Care, Other (non HMO) | Admitting: Family Medicine

## 2023-02-05 DIAGNOSIS — J019 Acute sinusitis, unspecified: Secondary | ICD-10-CM | POA: Diagnosis not present

## 2023-02-05 DIAGNOSIS — B9689 Other specified bacterial agents as the cause of diseases classified elsewhere: Secondary | ICD-10-CM

## 2023-02-05 MED ORDER — METOCLOPRAMIDE HCL 5 MG PO TABS: 5.0000 mg | ORAL_TABLET | Freq: Once | ORAL | 0 refills | Status: DC

## 2023-02-05 MED ORDER — DOXYCYCLINE HYCLATE 100 MG PO TABS: 100.0000 mg | ORAL_TABLET | Freq: Two times a day (BID) | ORAL | 0 refills | Status: AC

## 2023-02-05 MED ORDER — METOCLOPRAMIDE HCL 5 MG PO TABS: 5.0000 mg | ORAL_TABLET | Freq: Once | ORAL | 0 refills | Status: AC

## 2023-02-05 NOTE — Addendum Note (Signed)
 Addended by: Loretha Stapler on: 02/05/2023 03:42 PM   Modules accepted: Orders

## 2023-02-05 NOTE — Progress Notes (Signed)
E-Visit for Sinus Problems  We are sorry that you are not feeling well.  Here is how we plan to help!  Based on what you have shared with me it looks like you have sinusitis.  Sinusitis is inflammation and infection in the sinus cavities of the head.  Based on your presentation I believe you most likely have Acute Bacterial Sinusitis.  This is an infection caused by bacteria and is treated with antibiotics. I have prescribed Doxycycline 100mg by mouth twice a day for 10 days. You may use an oral decongestant such as Mucinex D or if you have glaucoma or high blood pressure use plain Mucinex. Saline nasal spray help and can safely be used as often as needed for congestion.  If you develop worsening sinus pain, fever or notice severe headache and vision changes, or if symptoms are not better after completion of antibiotic, please schedule an appointment with a health care provider.    Sinus infections are not as easily transmitted as other respiratory infection, however we still recommend that you avoid close contact with loved ones, especially the very young and elderly.  Remember to wash your hands thoroughly throughout the day as this is the number one way to prevent the spread of infection!  Home Care: Only take medications as instructed by your medical team. Complete the entire course of an antibiotic. Do not take these medications with alcohol. A steam or ultrasonic humidifier can help congestion.  You can place a towel over your head and breathe in the steam from hot water coming from a faucet. Avoid close contacts especially the very young and the elderly. Cover your mouth when you cough or sneeze. Always remember to wash your hands.  Get Help Right Away If: You develop worsening fever or sinus pain. You develop a severe head ache or visual changes. Your symptoms persist after you have completed your treatment plan.  Make sure you Understand these instructions. Will watch your  condition. Will get help right away if you are not doing well or get worse.  Thank you for choosing an e-visit.  Your e-visit answers were reviewed by a board certified advanced clinical practitioner to complete your personal care plan. Depending upon the condition, your plan could have included both over the counter or prescription medications.  Please review your pharmacy choice. Make sure the pharmacy is open so you can pick up prescription now. If there is a problem, you may contact your provider through MyChart messaging and have the prescription routed to another pharmacy.  Your safety is important to us. If you have drug allergies check your prescription carefully.   For the next 24 hours you can use MyChart to ask questions about today's visit, request a non-urgent call back, or ask for a work or school excuse. You will get an email in the next two days asking about your experience. I hope that your e-visit has been valuable and will speed your recovery.  I provided 5 minutes of non face-to-face time during this encounter for chart review, medication and order placement, as well as and documentation.   

## 2023-02-07 NOTE — Assessment & Plan Note (Signed)
 Hydrate and monitor tolerating Allopurinol

## 2023-02-07 NOTE — Assessment & Plan Note (Signed)
Encouraged good sleep hygiene such as dark, quiet room. No blue/green glowing lights such as computer screens in bedroom. No alcohol or stimulants in evening. Cut down on caffeine as able. Regular exercise is helpful but not just prior to bed time. May continue using Ambien prn

## 2023-02-07 NOTE — Assessment & Plan Note (Signed)
Tolerating statin, encouraged heart healthy diet, avoid trans fats, minimize simple carbs and saturated fats. Increase exercise as tolerated tolerating Atorvastatin 

## 2023-02-07 NOTE — Assessment & Plan Note (Signed)
 Monitor and report any concerns, no changes to meds. Encouraged heart healthy diet such as the DASH diet and exercise as tolerated.  ?

## 2023-02-07 NOTE — Assessment & Plan Note (Signed)
 hgba1c acceptable, minimize simple carbs. Increase exercise as tolerated.

## 2023-02-08 ENCOUNTER — Ambulatory Visit: Payer: Managed Care, Other (non HMO) | Admitting: Family Medicine

## 2023-02-08 ENCOUNTER — Encounter: Payer: Self-pay | Admitting: Family Medicine

## 2023-02-08 ENCOUNTER — Encounter (HOSPITAL_COMMUNITY): Payer: Self-pay | Admitting: Gastroenterology

## 2023-02-08 ENCOUNTER — Telehealth (INDEPENDENT_AMBULATORY_CARE_PROVIDER_SITE_OTHER): Payer: Managed Care, Other (non HMO) | Admitting: Family Medicine

## 2023-02-08 VITALS — BP 169/107 | HR 67

## 2023-02-08 DIAGNOSIS — E782 Mixed hyperlipidemia: Secondary | ICD-10-CM

## 2023-02-08 DIAGNOSIS — R739 Hyperglycemia, unspecified: Secondary | ICD-10-CM | POA: Diagnosis not present

## 2023-02-08 DIAGNOSIS — G4700 Insomnia, unspecified: Secondary | ICD-10-CM

## 2023-02-08 DIAGNOSIS — M10062 Idiopathic gout, left knee: Secondary | ICD-10-CM

## 2023-02-08 DIAGNOSIS — I10 Essential (primary) hypertension: Secondary | ICD-10-CM

## 2023-02-08 NOTE — Progress Notes (Signed)
 MyChart Video Visit    Virtual Visit via Video Note   This patient is at least at moderate risk for complications without adequate follow up. This format is felt to be most appropriate for this patient at this time. Physical exam was limited by quality of the video and audio technology used for the visit. Juanetta, CMA was able to get the patient set up on a video visit.  Patient location: home Patient and provider in visit Provider location: Office  I discussed the limitations of evaluation and management by telemedicine and the availability of in person appointments. The patient expressed understanding and agreed to proceed.  Visit Date: 02/08/2023  Today's healthcare provider: Harlene Horton, MD     Subjective:    Patient ID: Dakota [name], male    DOB: 09-24-1960, 63 y.o.   MRN: [id number]  Chief Complaint  Patient presents with  . Follow-up    6 month    HPI Discussed the use of AI scribe software for clinical note transcription with the patient, who gave verbal consent to proceed.  History of Present Illness   The patient, with a history of sinusitis, gout, and knee issues, presents for a routine check-up. They recently recovered from sinusitis treated with doxycycline  via an e-visit. They report feeling significantly better after a week of illness.  The patient also experienced a rash on their leg after a recent blood donation, which they self-diagnosed as anemia based on online research. They started taking an iron supplement, which led to the resolution of the rash.  The patient had a knee issue, evaluated by a sports medicine doctor who recommended physical therapy. However, the patient did not attend physical therapy as the knee started feeling better. No complaints of falls or trauma. no swelling or warmth noted.   The patient is due for a colonoscopy in a week, which they undergo every three years due to a history of polyps and a difficult exam. They also  regularly donate blood every eight to ten weeks.        Past Medical History:  Diagnosis Date  . Allergy    cats  . Anxiety   . Asthma    childhood  . CAD (coronary artery disease)   . Chicken pox as a child  . COVID-19   . Depression 12/25/2012  . Dysphagia 12/16/2015  . GERD (gastroesophageal reflux disease)   . H/O tobacco use, presenting hazards to health 02/11/2011   1/2 ppd encouraged Has tried nicotine replacements and Chantix in past   . Hyperlipidemia   . Hypertension   . Insomnia 06/11/2013  . 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 Kidney stone on left side 09/19/2013  . Left knee pain 12/25/2012  . Mumps as a child  . Onychomycosis 09/30/2014  . Overweight 12/16/2015  . Preventative health care 09/24/2013    Past Surgical History:  Procedure Laterality Date  . COLONOSCOPY    . COLONOSCOPY WITH PROPOFOL  N/A 12/04/2019   Procedure: COLONOSCOPY WITH PROPOFOL ;  Surgeon: Wilhelmenia Aloha Raddle., MD;  Location: [facility] ENDOSCOPY;  Service: Gastroenterology;  Laterality: N/A;  ultra slim scope   . CORONARY STENT PLACEMENT  01/14/2005  . FLEXIBLE BRONCHOSCOPY W/ UPPER ENDOSCOPY  11/03/2021  . LITHOTRIPSY  03/2014  . POLYPECTOMY  12/04/2019   Procedure: POLYPECTOMY;  Surgeon: [name], Aloha Raddle., MD;  Location: [facility] ENDOSCOPY;  Service: Gastroenterology;;  . UPPER GASTROINTESTINAL ENDOSCOPY  09/12/2019   Danis    Family History  Problem  Relation Age of Onset  . Multiple sclerosis Mother   . Cancer Father        esophagus/ brain  . Alcohol abuse Father        smoker  . Esophageal cancer Father   . Tremor Father   . Seizures Sister        AVM  . COPD Sister   . Alcohol abuse Sister        addiction, recovered  . Tremor Son   . Cancer Paternal Aunt        leukemia  . Heart disease Maternal Grandfather        MI  . Colon cancer Neg Hx   . Colon polyps Neg Hx   . Rectal cancer Neg Hx   . Stomach cancer Neg Hx     Social History   Socioeconomic  History  . Marital status: Married    Spouse name: Not on file  . Number of children: 2  . Years of education: Not on file  . Highest education level: Bachelor's degree (e.g., BA, AB, BS)  Occupational History  . Occupation: technical sales engineer  Tobacco Use  . Smoking status: Former    Current packs/day: 0.00    Average packs/day: 0.5 packs/day for 20.0 years (10.0 ttl pk-yrs)    Types: Cigarettes    Start date: 02/20/1995    Quit date: 02/20/2015    Years since quitting: 7.9  . Smokeless tobacco: Never  Vaping Use  . Vaping status: Former  Substance and Sexual Activity  . Alcohol use: Yes    Alcohol/week: 2.0 standard drinks of alcohol    Types: 2 Standard drinks or equivalent per week    Comment: 2-3 per week  . Drug use: No  . Sexual activity: Yes    Partners: Female    Comment: wife, real psychologist, occupational, no dairy, minimal fried foods  Other Topics Concern  . Not on file  Social History Narrative   1 biological son   1 adopted child   3 step children   Social Drivers of Corporate Investment Banker Strain: Low Risk  (07/27/2022)   Overall Financial Resource Strain (CARDIA)   . Difficulty of Paying Living Expenses: Not hard at all  Food Insecurity: No Food Insecurity (07/27/2022)   Hunger Vital Sign   . Worried About Programme Researcher, Broadcasting/film/video in the Last Year: Never true   . Ran Out of Food in the Last Year: Never true  Transportation Needs: No Transportation Needs (07/27/2022)   PRAPARE - Transportation   . Lack of Transportation (Medical): No   . Lack of Transportation (Non-Medical): No  Physical Activity: Sufficiently Active (07/27/2022)   Exercise Vital Sign   . Days of Exercise per Week: 4 days   . Minutes of Exercise per Session: 120 min  Stress: Stress Concern Present (07/27/2022)   [employer] of Occupational Health - Occupational Stress Questionnaire   . Feeling of Stress : To some extent  Social Connections: Moderately Integrated (07/27/2022)   Social  Connection and Isolation Panel [NHANES]   . Frequency of Communication with Friends and Family: More than three times a week   . Frequency of Social Gatherings with Friends and Family: More than three times a week   . Attends Religious Services: More than 4 times per year   . Active Member of Clubs or Organizations: No   . Attends Banker Meetings: Not on file   . Marital Status: Married  Intimate Partner Violence: Unknown (05/09/2021)   [facility]   . Physically Hurt: Not on file   . Insult or Talk Down To: Not on file   . Threaten Physical Harm: Not on file   . Scream or Curse: Not on file    Outpatient Medications Prior to Visit  Medication Sig Dispense Refill  . allopurinol  (ZYLOPRIM ) 300 MG tablet Take 1 tablet (300 mg total) by mouth daily. 90 tablet 1  . aspirin  EC 81 MG tablet Take 1 tablet (81 mg total) by mouth daily. Swallow whole. 90 tablet 3  . atorvastatin  (LIPITOR) 80 MG tablet TAKE 1 TABLET DAILY AT 6 P.M. (SCHEDULE AN APPOINTMENT FOR FUTURE REFILLS) 60 tablet 5  . budesonide  (PULMICORT ) 0.5 MG/2ML nebulizer solution Take 8 mLs (2 mg total) by nebulization at bedtime. 240 mL 1  . buPROPion  (WELLBUTRIN  XL) 300 MG 24 hr tablet Take 1 tablet (300 mg total) by mouth daily. 90 tablet 3  . cetirizine (ZYRTEC) 10 MG tablet Take 10 mg by mouth daily as needed for allergies.    . clobetasol  ointment (TEMOVATE ) 0.05 % Apply 1 application topically 2 (two) times daily as needed (psoriasis). 60 g 2  . clotrimazole (LOTRIMIN) 1 % cream Apply topically 2 (two) times daily.    . desonide  (DESOWEN ) 0.05 % ointment Apply 1 Application topically 2 (two) times daily. 15 g 0  . doxycycline  (VIBRA -TABS) 100 MG tablet Take 1 tablet (100 mg total) by mouth 2 (two) times daily for 10 days. 20 tablet 0  . hydrocortisone 2.5 % cream Apply topically 2 (two) times daily.    . indomethacin  (INDOCIN ) 50 MG capsule TAKE 1 CAPSULE TWICE A DAY AS NEEDED  180 capsule 3  . lansoprazole  (PREVACID ) 30 MG capsule Take 1 capsule (30 mg total) by mouth 2 (two) times daily before a meal. 60 capsule 2  . losartan  (COZAAR ) 50 MG tablet TAKE 1 TABLET DAILY 90 tablet 3  . Melatonin 10 MG CAPS Take 10 mg by mouth at bedtime as needed (sleep).    . Meloxicam  15 MG TBDP Take by mouth as needed.    . methylPREDNISolone  (MEDROL ) 4 MG tablet 5 tab po qd X 1d then 4 tab po qd X 1d then 3 tab po qd X 1d then 2 tab po qd then 1 tab po qd 15 tablet 0  . metoprolol  succinate (TOPROL -XL) 50 MG 24 hr tablet Take 1 tablet (50 mg total) by mouth daily. 90 tablet 3  . Multiple Vitamin (MULTIVITAMIN WITH MINERALS) TABS tablet Take 1 tablet by mouth daily.    . mupirocin  ointment (BACTROBAN ) 2 % Place 1 Application into the nose 2 (two) times daily. 30 g 1  . zolpidem  (AMBIEN ) 10 MG tablet TAKE 1/2 TO 1 TABLET(5 TO 10 MG) BY MOUTH AT BEDTIME AS NEEDED FOR SLEEP 30 tablet 3  . ezetimibe  (ZETIA ) 10 MG tablet Take 1 tablet (10 mg total) by mouth daily. 90 tablet 3  . metoCLOPramide  (REGLAN ) 5 MG tablet Take 1 tablet (5 mg total) by mouth once for 1 dose. 1 tablet 0   No facility-administered medications prior to visit.    Allergies  Allergen Reactions  . Neosporin [Neomycin-Bacitracin Zn-Polymyx] Hives and Itching  . Polysporin [Bacitracin-Polymyxin B] Hives and Itching  . Amoxicillin Itching and Rash    Review of Systems  Constitutional:  Negative for fever and malaise/fatigue.  HENT:  Negative for congestion.   Eyes:  Negative for blurred vision.  Respiratory:  Negative for shortness of breath.   Cardiovascular:  Negative for chest pain, palpitations and leg swelling.  Gastrointestinal:  Negative for abdominal pain, blood in stool and nausea.  Genitourinary:  Negative for dysuria and frequency.  Musculoskeletal:  Positive for joint pain. Negative for falls.  Skin:  Negative for rash.  Neurological:  Negative for dizziness, loss of consciousness and headaches.   Endo/Heme/Allergies:  Negative for environmental allergies.  Psychiatric/Behavioral:  Negative for depression. The patient is not nervous/anxious.       Objective:    Physical Exam Constitutional:      General: He is not in acute distress.    Appearance: Normal appearance. He is not ill-appearing or toxic-appearing.  HENT:     Head: Normocephalic and atraumatic.     Right Ear: External ear normal.     Left Ear: External ear normal.     Nose: Nose normal.  Eyes:     General:        Right eye: No discharge.        Left eye: No discharge.  Pulmonary:     Effort: Pulmonary effort is normal.  Skin:    Findings: No rash.  Neurological:     Mental Status: He is alert and oriented to person, place, and time.  Psychiatric:        Behavior: Behavior normal.   BP (!) 169/107 Comment: Hasn't taken blood pressure medication this morning  Pulse 67  Wt Readings from Last 3 Encounters:  12/29/22 197 lb (89.4 kg)  10/09/22 197 lb (89.4 kg)  08/18/22 195 lb (88.5 kg)       Assessment & Plan:  Essential hypertension Assessment & Plan: Monitor and report any concerns, no changes to meds. Encouraged heart healthy diet such as the DASH diet and exercise as tolerated.   Orders: -     Comprehensive metabolic panel; Future -     CBC with Differential/Platelet; Future -     TSH; Future  Hyperglycemia Assessment & Plan: hgba1c acceptable, minimize simple carbs. Increase exercise as tolerated.   Orders: -     Hemoglobin A1c; Future  Hyperlipidemia, mixed Assessment & Plan: Tolerating statin, encouraged heart healthy diet, avoid trans fats, minimize simple carbs and saturated fats. Increase exercise as tolerated tolerating Atorvastatin   Orders: -     Lipid panel; Future  Insomnia, unspecified type Assessment & Plan: Encouraged good sleep hygiene such as dark, quiet room. No blue/green glowing lights such as computer screens in bedroom. No alcohol or stimulants in evening. Cut down  on caffeine as able. Regular exercise is helpful but not just prior to bed time. May continue using Ambien  prn   Acute idiopathic gout of left knee Assessment & Plan: Hydrate and monitor tolerating Allopurinol   Orders: -     Uric acid; Future     Assessment and Plan    Sinusitis Recently treated with Doxycycline  via e-visit. Reports improvement. -Continue current treatment plan.  Anemia Self-reported rash on leg after blood donation, self-treated with iron supplement. -Order CBC to assess for anemia.  Gout No recent flare-ups reported. -Order uric acid level to monitor gout management.  General Health Maintenance -Order comprehensive metabolic panel, lipid panel, and thyroid  function tests as it has been approximately 6 months since last assessment. -Schedule colonoscopy in one week. -Consider Prevnar 20 vaccination for pneumococcal pneumonia prevention. -Encourage patient to receive annual flu shot at local pharmacy. -Schedule physical exam in 6 months.  I discussed the assessment and treatment plan with the patient. The patient was provided an opportunity to ask questions and all were answered. The patient agreed with the plan and demonstrated an understanding of the instructions.   The patient was advised to call back or seek an in-person evaluation if the symptoms worsen or if the condition fails to improve as anticipated.  Harlene Horton, MD [facility] at [facility] [phone] (phone) [phone] (fax)  [facility]

## 2023-02-09 ENCOUNTER — Other Ambulatory Visit (INDEPENDENT_AMBULATORY_CARE_PROVIDER_SITE_OTHER): Payer: Managed Care, Other (non HMO)

## 2023-02-09 DIAGNOSIS — I10 Essential (primary) hypertension: Secondary | ICD-10-CM | POA: Diagnosis not present

## 2023-02-09 DIAGNOSIS — R739 Hyperglycemia, unspecified: Secondary | ICD-10-CM | POA: Diagnosis not present

## 2023-02-09 DIAGNOSIS — M10062 Idiopathic gout, left knee: Secondary | ICD-10-CM | POA: Diagnosis not present

## 2023-02-09 DIAGNOSIS — E782 Mixed hyperlipidemia: Secondary | ICD-10-CM

## 2023-02-09 NOTE — Progress Notes (Signed)
 Attempted to obtain medical history for pre op call via telephone, unable to reach at this time. HIPAA compliant voicemail message left requesting return call to pre surgical testing department.

## 2023-02-10 ENCOUNTER — Other Ambulatory Visit: Payer: Self-pay | Admitting: *Deleted

## 2023-02-10 DIAGNOSIS — E782 Mixed hyperlipidemia: Secondary | ICD-10-CM

## 2023-02-10 DIAGNOSIS — R739 Hyperglycemia, unspecified: Secondary | ICD-10-CM

## 2023-02-10 DIAGNOSIS — I10 Essential (primary) hypertension: Secondary | ICD-10-CM

## 2023-02-10 DIAGNOSIS — M10062 Idiopathic gout, left knee: Secondary | ICD-10-CM

## 2023-02-10 LAB — COMPREHENSIVE METABOLIC PANEL
ALT: 34 U/L (ref 0–53)
AST: 32 U/L (ref 0–37)
Albumin: 4.3 g/dL (ref 3.5–5.2)
Alkaline Phosphatase: 99 U/L (ref 39–117)
BUN: 18 mg/dL (ref 6–23)
CO2: 29 meq/L (ref 19–32)
Calcium: 9.5 mg/dL (ref 8.4–10.5)
Chloride: 100 meq/L (ref 96–112)
Creatinine, Ser: 1.02 mg/dL (ref 0.40–1.50)
GFR: 78.62 mL/min (ref >60.00)
Glucose, Bld: 123 mg/dL — ABNORMAL HIGH (ref 70–99)
Potassium: 4.3 meq/L (ref 3.5–5.1)
Sodium: 137 meq/L (ref 135–145)
Total Bilirubin: 0.4 mg/dL (ref 0.2–1.2)
Total Protein: 7.3 g/dL (ref 6.0–8.3)

## 2023-02-10 LAB — CBC WITH DIFFERENTIAL/PLATELET
Basophils Absolute: 0.1 10*3/uL (ref 0.0–0.1)
Basophils Relative: 1.2 % (ref 0.0–3.0)
Eosinophils Absolute: 0.6 10*3/uL (ref 0.0–0.7)
Eosinophils Relative: 8.4 % — ABNORMAL HIGH (ref 0.0–5.0)
HCT: 44.3 % (ref 39.0–52.0)
Hemoglobin: 14.4 g/dL (ref 13.0–17.0)
Lymphocytes Relative: 27.4 % (ref 12.0–46.0)
Lymphs Abs: 1.9 10*3/uL (ref 0.7–4.0)
MCHC: 32.5 g/dL (ref 30.0–36.0)
MCV: 93.5 fL (ref 78.0–100.0)
Monocytes Absolute: 0.9 10*3/uL (ref 0.1–1.0)
Monocytes Relative: 13.7 % — ABNORMAL HIGH (ref 3.0–12.0)
Neutro Abs: 3.4 10*3/uL (ref 1.4–7.7)
Neutrophils Relative %: 49.3 % (ref 43.0–77.0)
Platelets: 302 10*3/uL (ref 150.0–400.0)
RBC: 4.74 Mil/uL (ref 4.22–5.81)
RDW: 14.4 % (ref 11.5–15.5)
WBC: 6.8 10*3/uL (ref 4.0–10.5)

## 2023-02-10 LAB — HEMOGLOBIN A1C: Hgb A1c MFr Bld: 6.1 % (ref 4.6–6.5)

## 2023-02-10 LAB — LIPID PANEL
Cholesterol: 177 mg/dL (ref 0–200)
HDL: 37.2 mg/dL — ABNORMAL LOW (ref >39.00)
LDL Cholesterol: 98 mg/dL (ref 0–99)
NonHDL: 139.85
Total CHOL/HDL Ratio: 5
Triglycerides: 208 mg/dL — ABNORMAL HIGH (ref 0.0–149.0)
VLDL: 41.6 mg/dL — ABNORMAL HIGH (ref 0.0–40.0)

## 2023-02-10 LAB — TSH: TSH: 1.65 u[IU]/mL (ref 0.35–5.50)

## 2023-02-10 LAB — URIC ACID: Uric Acid, Serum: 8.5 mg/dL — ABNORMAL HIGH (ref 4.0–7.8)

## 2023-02-10 MED ORDER — ALLOPURINOL 200 MG PO TABS: 2.0000 | ORAL_TABLET | Freq: Every day | ORAL | 1 refills | Status: AC

## 2023-02-10 NOTE — Addendum Note (Signed)
 Addended by: Thelma Barge D on: 02/10/2023 03:54 PM   Modules accepted: Orders

## 2023-02-11 ENCOUNTER — Encounter: Payer: Self-pay | Admitting: Gastroenterology

## 2023-02-11 MED ORDER — POLYETHYLENE GLYCOL 3350 17 GM/SCOOP PO POWD: 1.0000 | Freq: Every day | ORAL | 3 refills | Status: DC

## 2023-02-11 NOTE — Addendum Note (Signed)
 Addended by: Loretha Stapler on: 02/11/2023 09:53 AM   Modules accepted: Orders

## 2023-02-17 ENCOUNTER — Encounter (HOSPITAL_COMMUNITY): Payer: Self-pay | Admitting: Gastroenterology

## 2023-02-17 NOTE — Anesthesia Preprocedure Evaluation (Addendum)
Anesthesia Evaluation  Patient identified by MRN, date of birth, ID band Patient awake    Reviewed: Allergy & Precautions, NPO status , Patient's Chart, lab work & pertinent test results  Airway Mallampati: II  TM Distance: >3 FB Neck ROM: Full    Dental no notable dental hx. (+) Teeth Intact, Dental Advisory Given   Pulmonary asthma , former smoker   Pulmonary exam normal breath sounds clear to auscultation       Cardiovascular hypertension, Pt. on medications + CAD  Normal cardiovascular exam Rhythm:Regular Rate:Normal     Neuro/Psych  PSYCHIATRIC DISORDERS Anxiety Depression       GI/Hepatic ,GERD  ,,  Endo/Other    Renal/GU Renal disease     Musculoskeletal   Abdominal   Peds  Hematology   Anesthesia Other Findings All: Amoxicillin, neosporin  Reproductive/Obstetrics                             Anesthesia Physical Anesthesia Plan  ASA: 2  Anesthesia Plan: MAC   Post-op Pain Management: Minimal or no pain anticipated   Induction: Intravenous  PONV Risk Score and Plan: Treatment may vary due to age or medical condition and Propofol infusion  Airway Management Planned: Natural Airway and Simple Face Mask  Additional Equipment: None  Intra-op Plan:   Post-operative Plan:   Informed Consent: I have reviewed the patients History and Physical, chart, labs and discussed the procedure including the risks, benefits and alternatives for the proposed anesthesia with the patient or authorized representative who has indicated his/her understanding and acceptance.     Dental advisory given  Plan Discussed with: CRNA and Anesthesiologist  Anesthesia Plan Comments: (Colonoscopy for colon polyps)        Anesthesia Quick Evaluation

## 2023-02-18 ENCOUNTER — Ambulatory Visit (HOSPITAL_COMMUNITY): Payer: Self-pay | Admitting: Anesthesiology

## 2023-02-18 ENCOUNTER — Ambulatory Visit (HOSPITAL_COMMUNITY)
Admission: RE | Admit: 2023-02-18 | Discharge: 2023-02-18 | Disposition: A | Discharge status: Home / Self Care | Payer: Managed Care, Other (non HMO) | Attending: Gastroenterology | Admitting: Gastroenterology

## 2023-02-18 ENCOUNTER — Other Ambulatory Visit: Payer: Self-pay

## 2023-02-18 ENCOUNTER — Encounter (HOSPITAL_COMMUNITY)
Admission: RE | Disposition: A | Discharge status: Home / Self Care | Payer: Self-pay | Source: Home / Self Care | Attending: Gastroenterology

## 2023-02-18 ENCOUNTER — Other Ambulatory Visit: Payer: Self-pay | Admitting: Cardiology

## 2023-02-18 ENCOUNTER — Encounter (HOSPITAL_COMMUNITY): Payer: Self-pay | Admitting: Gastroenterology

## 2023-02-18 DIAGNOSIS — K641 Second degree hemorrhoids: Secondary | ICD-10-CM | POA: Diagnosis not present

## 2023-02-18 DIAGNOSIS — Z860101 Personal history of adenomatous and serrated colon polyps: Secondary | ICD-10-CM | POA: Diagnosis not present

## 2023-02-18 DIAGNOSIS — D128 Benign neoplasm of rectum: Secondary | ICD-10-CM

## 2023-02-18 DIAGNOSIS — J45909 Unspecified asthma, uncomplicated: Secondary | ICD-10-CM | POA: Diagnosis not present

## 2023-02-18 DIAGNOSIS — K5730 Diverticulosis of large intestine without perforation or abscess without bleeding: Secondary | ICD-10-CM

## 2023-02-18 DIAGNOSIS — K219 Gastro-esophageal reflux disease without esophagitis: Secondary | ICD-10-CM | POA: Insufficient documentation

## 2023-02-18 DIAGNOSIS — D122 Benign neoplasm of ascending colon: Secondary | ICD-10-CM

## 2023-02-18 DIAGNOSIS — D123 Benign neoplasm of transverse colon: Secondary | ICD-10-CM | POA: Diagnosis not present

## 2023-02-18 DIAGNOSIS — I2510 Atherosclerotic heart disease of native coronary artery without angina pectoris: Secondary | ICD-10-CM | POA: Insufficient documentation

## 2023-02-18 DIAGNOSIS — Z1211 Encounter for screening for malignant neoplasm of colon: Secondary | ICD-10-CM | POA: Diagnosis present

## 2023-02-18 DIAGNOSIS — Z860100 Personal history of colon polyps, unspecified: Secondary | ICD-10-CM | POA: Diagnosis not present

## 2023-02-18 DIAGNOSIS — I10 Essential (primary) hypertension: Secondary | ICD-10-CM | POA: Diagnosis not present

## 2023-02-18 DIAGNOSIS — K644 Residual hemorrhoidal skin tags: Secondary | ICD-10-CM | POA: Diagnosis not present

## 2023-02-18 DIAGNOSIS — K635 Polyp of colon: Secondary | ICD-10-CM

## 2023-02-18 DIAGNOSIS — D120 Benign neoplasm of cecum: Secondary | ICD-10-CM

## 2023-02-18 DIAGNOSIS — Q438 Other specified congenital malformations of intestine: Secondary | ICD-10-CM | POA: Insufficient documentation

## 2023-02-18 DIAGNOSIS — K56699 Other intestinal obstruction unspecified as to partial versus complete obstruction: Secondary | ICD-10-CM | POA: Diagnosis not present

## 2023-02-18 DIAGNOSIS — Q439 Congenital malformation of intestine, unspecified: Secondary | ICD-10-CM

## 2023-02-18 HISTORY — PX: COLONOSCOPY WITH PROPOFOL: SHX5780

## 2023-02-18 HISTORY — PX: POLYPECTOMY: SHX5525

## 2023-02-18 SURGERY — COLONOSCOPY WITH PROPOFOL
Anesthesia: Monitor Anesthesia Care

## 2023-02-18 MED ORDER — PROPOFOL 10 MG/ML IV BOLUS
INTRAVENOUS | Status: AC
  Filled 2023-02-18: qty 20

## 2023-02-18 MED ORDER — PROPOFOL 500 MG/50ML IV EMUL
INTRAVENOUS | Status: DC | PRN
  Administered 2023-02-18: 155 ug/kg/min via INTRAVENOUS

## 2023-02-18 MED ORDER — LIDOCAINE HCL (CARDIAC) PF 100 MG/5ML IV SOSY
PREFILLED_SYRINGE | INTRAVENOUS | Status: DC | PRN
  Administered 2023-02-18: 80 mg via INTRAVENOUS

## 2023-02-18 MED ORDER — PROPOFOL 500 MG/50ML IV EMUL
INTRAVENOUS | Status: AC
  Filled 2023-02-18: qty 50

## 2023-02-18 MED ORDER — PROPOFOL 10 MG/ML IV BOLUS
INTRAVENOUS | Status: DC | PRN
  Administered 2023-02-18 (×2): 30 mg via INTRAVENOUS

## 2023-02-18 MED ORDER — PROPOFOL 1000 MG/100ML IV EMUL
INTRAVENOUS | Status: AC
  Filled 2023-02-18: qty 300

## 2023-02-18 MED ORDER — SODIUM CHLORIDE 0.9 % IV SOLN: INTRAVENOUS | Status: DC

## 2023-02-18 SURGICAL SUPPLY — 21 items
ELECT REM PT RETURN 9FT ADLT (ELECTROSURGICAL) IMPLANT
ELECTRODE REM PT RTRN 9FT ADLT (ELECTROSURGICAL) IMPLANT
FCP BXJMBJMB 240X2.8X (CUTTING FORCEPS)
FLOOR PAD 36X40 (MISCELLANEOUS) ×2 IMPLANT
FORCEPS BIOP RAD 4 LRG CAP 4 (CUTTING FORCEPS) IMPLANT
FORCEPS BIOP RJ4 240 W/NDL (CUTTING FORCEPS) IMPLANT
FORCEPS BXJMBJMB 240X2.8X (CUTTING FORCEPS) IMPLANT
INJECTOR/SNARE I SNARE (MISCELLANEOUS) IMPLANT
LUBRICANT JELLY 4.5OZ STERILE (MISCELLANEOUS) IMPLANT
MANIFOLD NEPTUNE II (INSTRUMENTS) IMPLANT
NDL SCLEROTHERAPY 25GX240 (NEEDLE) IMPLANT
NEEDLE SCLEROTHERAPY 25GX240 (NEEDLE) IMPLANT
PAD FLOOR 36X40 (MISCELLANEOUS) ×2 IMPLANT
PROBE APC STR FIRE (PROBE) IMPLANT
PROBE INJECTION GOLD 7FR (MISCELLANEOUS) IMPLANT
SNARE ROTATE MED OVAL 20MM (MISCELLANEOUS) IMPLANT
SYR 50ML LL SCALE MARK (SYRINGE) IMPLANT
TRAP SPECIMEN MUCOUS 40CC (MISCELLANEOUS) IMPLANT
TUBING ENDO SMARTCAP PENTAX (MISCELLANEOUS) IMPLANT
TUBING IRRIGATION ENDOGATOR (MISCELLANEOUS) ×2 IMPLANT
WATER STERILE IRR 1000ML POUR (IV SOLUTION) IMPLANT

## 2023-02-18 NOTE — Transfer of Care (Signed)
Immediate Anesthesia Transfer of Care Note  Patient: Dakota [name]  Procedure(s) Performed: Procedure(s) with comments: COLONOSCOPY WITH PROPOFOL (N/A) - 1 hour- abdominal binder/colowrap POLYPECTOMY  Patient Location: PACU  Anesthesia Type:MAC  Level of Consciousness:  sedated, patient cooperative and responds to stimulation  Airway & Oxygen Therapy:Patient Spontanous Breathing and Patient connected to face mask oxgen  Post-op Assessment:  Report given to PACU RN and Post -op Vital signs reviewed and stable  Post vital signs:  Reviewed and stable  Last Vitals:  Vitals:   02/18/23 0703  BP: (!) 157/92  Pulse: 65  Resp: 16  Temp: 36.8 C  SpO2: 98%    Complications: No apparent anesthesia complications

## 2023-02-18 NOTE — Discharge Instructions (Signed)

## 2023-02-18 NOTE — H&P (Signed)
GASTROENTEROLOGY PROCEDURE H&P NOTE   Primary Care Physician: Bradd Canary, MD  HPI: Dakota [name] is a 63 y.o. male who presents for Colonoscopy for surveillance of previous TAs and SSPs.  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   Kidney stone on left side 09/19/2013   Left knee pain 12/25/2012   Mumps as a child   Onychomycosis 09/30/2014   Overweight 12/16/2015   Preventative health care 09/24/2013   Past Surgical History:  Procedure Laterality Date   COLONOSCOPY     COLONOSCOPY WITH PROPOFOL N/A 12/04/2019   Procedure: COLONOSCOPY WITH PROPOFOL;  Surgeon: Lemar Lofty., MD;  Location: [facility] ENDOSCOPY;  Service: Gastroenterology;  Laterality: N/A;  ultra slim scope    CORONARY STENT PLACEMENT  01/14/2005   FLEXIBLE BRONCHOSCOPY W/ UPPER ENDOSCOPY  11/03/2021   LITHOTRIPSY  03/2014   POLYPECTOMY  12/04/2019   Procedure: POLYPECTOMY;  Surgeon: [name], [name], MD;  Location: [facility] ENDOSCOPY;  Service: Gastroenterology;;   UPPER GASTROINTESTINAL ENDOSCOPY  09/12/2019   Danis   Current Facility-Administered Medications  Medication Dose Route Frequency Provider Last Rate Last Admin   0.9 %  sodium chloride infusion   Intravenous Continuous [name], [name], MD   New Bag at 02/18/23 [phone]    Current Facility-Administered Medications:    0.9 %  sodium chloride infusion, , Intravenous, Continuous, [name], [name], MD, New Bag at 02/18/23 0734 Allergies  Allergen Reactions   Neosporin  [Neomycin-Bacitracin Zn-Polymyx] Hives and Itching   Polysporin [Bacitracin-Polymyxin B] Hives and Itching   Amoxicillin Itching and Rash   Family History  Problem Relation Age of Onset   Multiple sclerosis Mother    Cancer Father        esophagus/ brain   Alcohol abuse Father        smoker   Esophageal cancer Father    Tremor Father    Seizures Sister        AVM   COPD Sister    Alcohol abuse Sister        addiction, recovered   Tremor Son    Cancer Paternal Aunt        leukemia   Heart disease Maternal Grandfather        MI   Colon cancer Neg Hx    Colon polyps Neg Hx    Rectal cancer Neg Hx    Stomach cancer Neg Hx    Social History   Socioeconomic History   Marital status: Married    Spouse name: Not on file   Number of children: 2   Years of education: Not on file   Highest education level: Bachelor's degree (e.g., BA, AB, BS)  Occupational History   Occupation: Technical sales engineer  Tobacco Use   Smoking status: Former    Current packs/day: 0.00    Average packs/day: 0.5 packs/day for 20.0 years (10.0 ttl pk-yrs)    Types: Cigarettes    Start date:  02/20/1995    Quit date: 02/20/2015    Years since quitting: 8.0   Smokeless tobacco: Never  Vaping Use   Vaping status: Former  Substance and Sexual Activity   Alcohol use: Yes    Alcohol/week: 2.0 standard drinks of alcohol    Types: 2 Standard drinks or equivalent per week    Comment: 2-3 per week   Drug use: No   Sexual activity: Yes    Partners: Female    Comment: wife, real Psychologist, occupational, no dairy, minimal fried foods  Other Topics Concern   Not on file  Social History Narrative   1 biological son   1 adopted child   3 step children   Social Drivers of Corporate investment banker Strain: Low Risk  (07/27/2022)   Overall Financial Resource Strain (CARDIA)    Difficulty of Paying Living Expenses: Not hard at all  Food Insecurity: No Food Insecurity (07/27/2022)   Hunger Vital Sign    Worried  About Running Out of Food in the Last Year: Never true    Ran Out of Food in the Last Year: Never true  Transportation Needs: No Transportation Needs (07/27/2022)   PRAPARE - Administrator, Civil Service (Medical): No    Lack of Transportation (Non-Medical): No  Physical Activity: Sufficiently Active (07/27/2022)   Exercise Vital Sign    Days of Exercise per Week: 4 days    Minutes of Exercise per Session: 120 min  Stress: Stress Concern Present (07/27/2022)   [employer] of Occupational Health - Occupational Stress Questionnaire    Feeling of Stress : To some extent  Social Connections: Moderately Integrated (07/27/2022)   Social Connection and Isolation Panel [NHANES]    Frequency of Communication with Friends and Family: More than three times a week    Frequency of Social Gatherings with Friends and Family: More than three times a week    Attends Religious Services: More than 4 times per year    Active Member of [employer] or Organizations: No    Attends Engineer, structural: Not on file    Marital Status: Married  Intimate Partner Violence: Unknown (05/09/2021)   Received from [employer], [facility]   HITS    Physically Hurt: Not on file    Insult or Talk Down To: Not on file    Threaten Physical Harm: Not on file    Scream or Curse: Not on file    Physical Exam: Today's Vitals   02/09/23 1525 02/18/23 0703  BP:  (!) 157/92  Pulse:  65  Resp:  16  Temp:  98.2 F (36.8 C)  TempSrc:  Tympanic  SpO2:  98%  Weight: 88.5 kg 88.5 kg  Height:  6' (1.829 m)  PainSc:  0-No pain   Body mass index is 26.46 kg/m. GEN: NAD EYE: Sclerae anicteric ENT: MMM CV: Non-tachycardic GI: Soft, NT/ND NEURO:  Alert & Oriented x 3  Lab Results: No results for input(s): "WBC", "HGB", "HCT", "PLT" in the last 72 hours. BMET No results for input(s): "NA", "K", "CL", "CO2", "GLUCOSE", "BUN", "CREATININE", "CALCIUM" in the last 72 hours. LFT No results for  input(s): "PROT", "ALBUMIN", "AST", "ALT", "ALKPHOS", "BILITOT", "BILIDIR", "IBILI" in the last 72 hours. PT/INR No results for input(s): "LABPROT", "INR" in the last 72 hours.   Impression / Plan: This is a 63 y.o.male who presents for Colonoscopy for surveillance of previous TAs and SSPs.  The risks and benefits of endoscopic evaluation/treatment  were discussed with the patient and/or family; these include but are not limited to the risk of perforation, infection, bleeding, missed lesions, lack of diagnosis, severe illness requiring hospitalization, as well as anesthesia and sedation related illnesses.  The patient's history has been reviewed, patient examined, no change in status, and deemed stable for procedure.  The patient and/or family is agreeable to proceed.    Corliss Parish, MD [facility] Advanced Endoscopy Office # [phone]

## 2023-02-18 NOTE — Anesthesia Postprocedure Evaluation (Signed)
Anesthesia Post Note  Patient: EHAAN STAGNARO  Procedure(s) Performed: COLONOSCOPY WITH PROPOFOL POLYPECTOMY     Patient location during evaluation: Endoscopy Anesthesia Type: MAC Level of consciousness: awake and alert Pain management: pain level controlled Vital Signs Assessment: post-procedure vital signs reviewed and stable Respiratory status: spontaneous breathing, nonlabored ventilation, respiratory function stable and patient connected to nasal cannula oxygen Cardiovascular status: blood pressure returned to baseline and stable Postop Assessment: no apparent nausea or vomiting Anesthetic complications: no   No notable events documented.  Last Vitals:  Vitals:   02/18/23 0910 02/18/23 0918  BP: 101/70 (!) 154/67  Pulse: 63 60  Resp: 15 16  Temp:    SpO2: 100% 95%    Last Pain:  Vitals:   02/18/23 0918  TempSrc:   PainSc: 0-No pain                 Trevor Iha

## 2023-02-18 NOTE — Op Note (Signed)
[facility] Patient Name: Dakota [name] Procedure Date: 02/18/2023 MRN: [id number] Attending MD: Corliss Parish , MD, [id number] Date of Birth: 1960/12/23 CSN: [id number] Age: 63 Admit Type: Outpatient Procedure:                Colonoscopy Indications:              Surveillance: Personal history of adenomatous                            polyps on last colonoscopy > 3 years ago, High risk                            colon cancer surveillance: Personal history of                            adenoma less than 10 mm in size, High risk colon                            cancer surveillance: Personal history of sessile                            serrated colon polyp (less than 10 mm in size) with                            no dysplasia Providers:                Corliss Parish, MD, Fransisca Connors, Alan Ripper, Technician Referring MD:              Medicines:                Monitored Anesthesia Care Complications:            No immediate complications. Estimated Blood Loss:     Estimated blood loss was minimal. Procedure:                Pre-Anesthesia Assessment:                           - Prior to the procedure, a History and Physical                            was performed, and patient medications and                            allergies were reviewed. The patient's tolerance of                            previous anesthesia was also reviewed. The risks                            and benefits of the procedure and the sedation  options and risks were discussed with the patient.                            All questions were answered, and informed consent                            was obtained. Prior Anticoagulants: The patient has                            taken no anticoagulant or antiplatelet agents                            except for aspirin. ASA Grade Assessment: II - A                            patient with  mild systemic disease. After reviewing                            the risks and benefits, the patient was deemed in                            satisfactory condition to undergo the procedure.                           After obtaining informed consent, the colonoscope                            was passed under direct vision. Throughout the                            procedure, the patient's blood pressure, pulse, and                            oxygen saturations were monitored continuously. The                            PCF-H190TL ([id number]) Olympus slim colonoscope was                            introduced through the anus and advanced to the the                            ileocecal valve. The colonoscopy was somewhat                            difficult due to a tortuous colon. Successful                            completion of the procedure was aided by changing                            the patient's position, using manual pressure,  straightening and shortening the scope to obtain                            bowel loop reduction and using scope torsion. The                            patient tolerated the procedure. The quality of the                            bowel preparation was adequate. The ileocecal                            valve, appendiceal orifice, and rectum were                            photographed. Scope In: 8:07:19 AM Scope Out: 8:35:50 AM Scope Withdrawal Time: 0 hours 19 minutes 19 seconds  Total Procedure Duration: 0 hours 28 minutes 31 seconds  Findings:      The digital rectal exam findings include hemorrhoids. Pertinent       negatives include no palpable rectal lesions.      Skin tags were found on perianal exam.      A benign-appearing, intrinsic moderate stenosis measuring 4 cm (in       length) x 1.2 cm (inner diameter) was found in the recto-sigmoid colon       and was traversed.      The left colon was grossly tortuous.       Many small-mouthed diverticula were found in the recto-sigmoid colon and       sigmoid colon (within this region of significant tortuosity)      Five sessile polyps were found in the rectum (1), splenic flexure (1),       ascending colon (2), and cecum (1). The polyps were 2 to 6 mm in size.       These polyps were removed with a cold snare. Resection and retrieval       were complete.      Normal mucosa was found in the entire colon otherwise.      Non-bleeding non-thrombosed internal hemorrhoids were found during       retroflexion, during perianal exam and during digital exam. The       hemorrhoids were Grade II (internal hemorrhoids that prolapse but reduce       spontaneously). Impression:               - Hemorrhoids found on digital rectal exam.                            Perianal skin tags found on perianal exam.                           - Stenosis in the recto-sigmoid colon region and                            area of significant tortuosity with multiple                            diverticulosis in the recto-sigmoid  colon and in                            the sigmoid colon (noted as well).                           - Five 2 to 6 mm polyps in the rectum, at the                            splenic flexure, in the ascending colon and in the                            cecum, removed with a cold snare. Resected and                            retrieved.                           - Normal mucosa in the entire examined colon                            otherwise.                           - Non-bleeding non-thrombosed internal hemorrhoids. Moderate Sedation:      Not Applicable - Patient had care per Anesthesia. Recommendation:           - The patient will be observed post-procedure,                            until all discharge criteria are met.                           - Discharge patient to home.                           - Patient has a contact number available for                             emergencies. The signs and symptoms of potential                            delayed complications were discussed with the                            patient. Return to normal activities tomorrow.                            Written discharge instructions were provided to the                            patient.                           - High fiber diet.                           -  Use FiberCon 1-2 tablets PO daily.                           - Continue present medications.                           - Await pathology results.                           - Repeat colonoscopy in 3 - 5 years for                            surveillance based on final pathology. Preparation                            should include 1 week of MiraLAX and every other                            day Dulcolax for optimal visualization and next                            procedure. Use ultraslim colonoscope. Use abdominal                            binder. Use water insufflation.                           - The findings and recommendations were discussed                            with the patient.                           - The findings and recommendations were discussed                            with the patient's family. Procedure Code(s):        --- Professional ---                           [phone], Colonoscopy, flexible; with removal of                            tumor(s), polyp(s), or other lesion(s) by snare                            technique Diagnosis Code(s):        --- Professional ---                           U04.540, Other intestinal obstruction unspecified                            as to partial versus complete obstruction  K64.1, Second degree hemorrhoids                           D12.8, Benign neoplasm of rectum                           D12.3, Benign neoplasm of transverse colon (hepatic                            flexure or splenic flexure)                            D12.2, Benign neoplasm of ascending colon                           D12.0, Benign neoplasm of cecum                           K64.4, Residual hemorrhoidal skin tags                           Z86.010, Personal history of colonic polyps                           K57.30, Diverticulosis of large intestine without                            perforation or abscess without bleeding                           Q43.8, Other specified congenital malformations of                            intestine CPT copyright 2022 American Medical Association. All rights reserved. The codes documented in this report are preliminary and upon coder review may  be revised to meet current compliance requirements. Corliss Parish, MD 02/18/2023 8:56:28 AM Number of Addenda: 0

## 2023-02-19 ENCOUNTER — Encounter: Payer: Self-pay | Admitting: Gastroenterology

## 2023-02-19 LAB — SURGICAL PATHOLOGY

## 2023-02-21 ENCOUNTER — Encounter (HOSPITAL_COMMUNITY): Payer: Self-pay | Admitting: Gastroenterology

## 2023-04-28 ENCOUNTER — Other Ambulatory Visit: Payer: Self-pay | Admitting: Family Medicine

## 2023-04-28 ENCOUNTER — Encounter: Payer: Self-pay | Admitting: Family Medicine

## 2023-04-28 DIAGNOSIS — M79673 Pain in unspecified foot: Secondary | ICD-10-CM

## 2023-05-11 ENCOUNTER — Other Ambulatory Visit (INDEPENDENT_AMBULATORY_CARE_PROVIDER_SITE_OTHER): Payer: Managed Care, Other (non HMO)

## 2023-05-11 ENCOUNTER — Encounter: Payer: Self-pay | Admitting: Family Medicine

## 2023-05-11 DIAGNOSIS — R739 Hyperglycemia, unspecified: Secondary | ICD-10-CM | POA: Diagnosis not present

## 2023-05-11 DIAGNOSIS — E782 Mixed hyperlipidemia: Secondary | ICD-10-CM | POA: Diagnosis not present

## 2023-05-11 DIAGNOSIS — M10062 Idiopathic gout, left knee: Secondary | ICD-10-CM | POA: Diagnosis not present

## 2023-05-11 DIAGNOSIS — I10 Essential (primary) hypertension: Secondary | ICD-10-CM

## 2023-05-11 LAB — CBC WITH DIFFERENTIAL/PLATELET
Basophils Absolute: 0.1 10*3/uL (ref 0.0–0.1)
Basophils Relative: 0.8 % (ref 0.0–3.0)
Eosinophils Absolute: 0.3 10*3/uL (ref 0.0–0.7)
Eosinophils Relative: 4.7 % (ref 0.0–5.0)
HCT: 45 % (ref 39.0–52.0)
Hemoglobin: 14.6 g/dL (ref 13.0–17.0)
Lymphocytes Relative: 30.1 % (ref 12.0–46.0)
Lymphs Abs: 2 10*3/uL (ref 0.7–4.0)
MCHC: 32.6 g/dL (ref 30.0–36.0)
MCV: 97.1 fl (ref 78.0–100.0)
Monocytes Absolute: 0.8 10*3/uL (ref 0.1–1.0)
Monocytes Relative: 11.9 % (ref 3.0–12.0)
Neutro Abs: 3.4 10*3/uL (ref 1.4–7.7)
Neutrophils Relative %: 52.5 % (ref 43.0–77.0)
Platelets: 254 10*3/uL (ref 150.0–400.0)
RBC: 4.63 Mil/uL (ref 4.22–5.81)
RDW: 16.3 % — ABNORMAL HIGH (ref 11.5–15.5)
WBC: 6.5 10*3/uL (ref 4.0–10.5)

## 2023-05-11 LAB — LIPID PANEL
Cholesterol: 164 mg/dL (ref 0–200)
HDL: 43.3 mg/dL (ref >39.00)
LDL Cholesterol: 87 mg/dL (ref 0–99)
NonHDL: 120.91
Total CHOL/HDL Ratio: 4
Triglycerides: 168 mg/dL — ABNORMAL HIGH (ref 0.0–149.0)
VLDL: 33.6 mg/dL (ref 0.0–40.0)

## 2023-05-11 LAB — COMPREHENSIVE METABOLIC PANEL WITH GFR
ALT: 36 U/L (ref 0–53)
AST: 21 U/L (ref 0–37)
Albumin: 4.3 g/dL (ref 3.5–5.2)
Alkaline Phosphatase: 96 U/L (ref 39–117)
BUN: 26 mg/dL — ABNORMAL HIGH (ref 6–23)
CO2: 28 meq/L (ref 19–32)
Calcium: 9.3 mg/dL (ref 8.4–10.5)
Chloride: 102 meq/L (ref 96–112)
Creatinine, Ser: 0.97 mg/dL (ref 0.40–1.50)
GFR: 83.36 mL/min (ref >60.00)
Glucose, Bld: 101 mg/dL — ABNORMAL HIGH (ref 70–99)
Potassium: 4.6 meq/L (ref 3.5–5.1)
Sodium: 138 meq/L (ref 135–145)
Total Bilirubin: 0.4 mg/dL (ref 0.2–1.2)
Total Protein: 7 g/dL (ref 6.0–8.3)

## 2023-05-11 LAB — HEMOGLOBIN A1C: Hgb A1c MFr Bld: 6.1 % (ref 4.6–6.5)

## 2023-05-11 LAB — URIC ACID: Uric Acid, Serum: 4.6 mg/dL (ref 4.0–7.8)

## 2023-05-13 ENCOUNTER — Other Ambulatory Visit: Payer: Self-pay | Admitting: Family Medicine

## 2023-05-14 NOTE — Telephone Encounter (Signed)
 Requesting: Ambien 10mg   Contract:  08/03/22 UDS: 08/03/22 Last Visit: 02/08/23 Next Visit: 09/20/23 Last Refill: 01/21/23 #30 and 3RF   Please Advise

## 2023-07-07 ENCOUNTER — Other Ambulatory Visit: Payer: Self-pay | Admitting: Family Medicine

## 2023-07-13 ENCOUNTER — Other Ambulatory Visit: Payer: Self-pay | Admitting: Family

## 2023-07-15 ENCOUNTER — Other Ambulatory Visit: Payer: Self-pay | Admitting: Family

## 2023-07-15 ENCOUNTER — Encounter: Payer: Self-pay | Admitting: Family Medicine

## 2023-07-15 MED ORDER — ZOLPIDEM TARTRATE 10 MG PO TABS: 10.0000 mg | ORAL_TABLET | Freq: Every day | ORAL | 1 refills | Status: DC

## 2023-08-03 ENCOUNTER — Encounter: Payer: Self-pay | Admitting: Family Medicine

## 2023-08-12 ENCOUNTER — Telehealth: Admitting: Emergency Medicine

## 2023-08-12 DIAGNOSIS — L409 Psoriasis, unspecified: Secondary | ICD-10-CM | POA: Diagnosis not present

## 2023-08-12 DIAGNOSIS — L551 Sunburn of second degree: Secondary | ICD-10-CM

## 2023-08-12 MED ORDER — METHYLPREDNISOLONE 4 MG PO TABS: ORAL_TABLET | ORAL | 0 refills | Status: DC

## 2023-08-12 NOTE — Progress Notes (Signed)
 E-Visit for Sunburn  We are sorry that you are not feeling well.  Here is how we plan to help!  Based on what you have shared with me, I'd like to share with you a treatment plan for sunburn.   Most sunburn is a first degree burn that turns the skin pink or red.  It can be painful to touch.  If you stayed in the sun for a prolonged period this might have progressed to a second degree burn with blistering!  I've set in an RX for prednisone .  Usually the pain and swelling starts after about 4 hours, peaks at 24 hours and begins to improve after 48 hours or about 2 days.  REMEMBER prolonged exposure to the sun increases your risk of skin cancer so use sunscreen before you go outside!  We will give you more information about sunscreen use later in your care plan.  Your sunburn can be managed by self-care at home.  Please use the following care guide to manage your sunburn.  If you symptoms worsen, you have other questions or concerns, or you develop any of the warnings signs listed in your care plan you will need to seek a face to face visit with a provider without waiting!  Home Care Advice for Treating Mild Sunburn:  Take Ibuprofen (Advil, Motrin) for pain relief as soon as possible.  The adult dosage is up to 600 mg every 6 hours.  Starting within 6 hours of sun exposure may greatly reduce your discomfort.  If you cannot take Ibuprofen you may use Acetaminophen  instead. Do not take Ibuprofen if you have stomach problems, kidney disease or are pregnant. Do not take Ibuprofen if you have been told by your doctor or pharmacist to avoid this class of drugs. Do not take Acetaminophen  if you have liver disease. Read the package warnings on any medication that you take!  2.  Use a steroid cream on the affected skin.  If you apply an over the counter steroid       cream as soon as possible and repeat it three times a day it may reduce the pain and      and swelling.  Until you get the steroid cream  you may start with a moistening cream      cream or aloe gel.  3. For second or third degree sunburn with painful blistering, you can use an over the         counter product Burn Jel Plus Pain Relieving Gel. Apply in a thick even layer over the     affected area not more than 3 to 4 times daily If you need to cover the area to protect      it from friction of clothing, you can use Moist Burn Pads such as Hydrogel Burn Pads       which are available over the counter.  4.  Apply cool compresses to the burned areas several times a day.  5.  Avoid soap on the sunburned areas.  6.  Drink plenty of water.  It is easy to get dehydrated from prolong time in the sun      Outdoors.  7.  For any broken blisters: Trim off the dead skin with fine scissors.  It is wise to clean the scissor with alcohol before use. Apply antibiotic ointments to the blister.  Apply twice a day for three days.  There are triple antibiotic ointments with topical pain relievers available at stores. Caution:leave  intact blisters alone.  They are protecting the skin and will allow it to heal.  8.  Taking Vitamin C orally may reduce sun damage to your skin.  Follow the      Instructions on the bottle.  The recommended adult dosage is 2 grams.  What to Expect:  Pain usually stops after 2 or 3 days. Skin flaking and peeling usually occurs for 3-7 days after a sunburn.  Call your provider if:  You feel very weak or have difficulty standing. Blister develops on your face. You become sensitive to light because of eye pain. Your skin looks infected (red streaks, puss or worsening tenderness after 48 hours. You feel you should be seen.  Preventing Sunburns:  Apply 20-30 SPF sunscreen to your skin before going into the sun. Reapply every 2-4 hours or after sweating or swimming. Sunscreens protect from sunburns but do not completely prevent skin damage.  [place] exposure still increases your risk of premature aging and skin  cancers.  Thank you for choosing an e-visit.  Your e-visit answers were reviewed by a board certified advanced clinical practitioner to complete your personal care plan. Depending upon the condition, your plan could have included both over the counter or prescription medications.  Please review your pharmacy choice. Make sure the pharmacy is open so you can pick up prescription now. If there is a problem, you may contact your provider through [employer] and have the prescription routed to another pharmacy.  Your safety is important to us . If you have drug allergies check your prescription carefully.   For the next 24 hours you can use MyChart to ask questions about today's visit, request a non-urgent call back, or ask for a work or school excuse. You will get an email in the next two days asking about your experience. I hope that your e-visit has been valuable and will speed your recovery.  Approximately 5 minutes was used in reviewing the patient's chart, questionnaire, prescribing medications, and documentation.

## 2023-08-17 ENCOUNTER — Other Ambulatory Visit: Payer: Self-pay | Admitting: Family Medicine

## 2023-08-17 ENCOUNTER — Other Ambulatory Visit: Payer: Self-pay | Admitting: Cardiology

## 2023-09-15 ENCOUNTER — Other Ambulatory Visit: Payer: Self-pay | Admitting: Family

## 2023-09-17 ENCOUNTER — Encounter: Payer: Self-pay | Admitting: Family Medicine

## 2023-09-17 MED ORDER — ZOLPIDEM TARTRATE 10 MG PO TABS: 10.0000 mg | ORAL_TABLET | Freq: Every day | ORAL | 1 refills | Status: DC

## 2023-09-17 NOTE — Telephone Encounter (Signed)
 Requesting: Ambien  Contract: 08/03/2022 UDS: 08/03/2022 Last Visit: 10/09/2022 Next Visit: 09/20/2023 Last Refill: 07/15/2023  Please Advise

## 2023-09-19 NOTE — Assessment & Plan Note (Signed)
 Patient encouraged to maintain heart healthy diet, regular exercise, adequate sleep. Consider daily probiotics. Take medications as prescribed. Labs ordered and reviewed  Colonoscopy 2025 repeat in 2030 Given and reviewed copy of ACP documents from [employer] and encouraged to complete and return

## 2023-09-19 NOTE — Assessment & Plan Note (Signed)
Encouraged good sleep hygiene such as dark, quiet room. No blue/green glowing lights such as computer screens in bedroom. No alcohol or stimulants in evening. Cut down on caffeine as able. Regular exercise is helpful but not just prior to bed time. May continue using Ambien prn

## 2023-09-19 NOTE — Assessment & Plan Note (Signed)
 Hydrate and monitor tolerating Allopurinol

## 2023-09-19 NOTE — Assessment & Plan Note (Signed)
 Monitor and report any concerns, no changes to meds. Encouraged heart healthy diet such as the DASH diet and exercise as tolerated.  ?

## 2023-09-19 NOTE — Assessment & Plan Note (Signed)
Tolerating statin, encouraged heart healthy diet, avoid trans fats, minimize simple carbs and saturated fats. Increase exercise as tolerated tolerating Atorvastatin 

## 2023-09-19 NOTE — Assessment & Plan Note (Signed)
 hgba1c acceptable, minimize simple carbs. Increase exercise as tolerated.

## 2023-09-20 ENCOUNTER — Encounter: Payer: Self-pay | Admitting: Family Medicine

## 2023-09-20 ENCOUNTER — Other Ambulatory Visit (HOSPITAL_BASED_OUTPATIENT_CLINIC_OR_DEPARTMENT_OTHER): Payer: Self-pay

## 2023-09-20 ENCOUNTER — Ambulatory Visit (INDEPENDENT_AMBULATORY_CARE_PROVIDER_SITE_OTHER): Payer: Managed Care, Other (non HMO) | Admitting: Family Medicine

## 2023-09-20 VITALS — BP 138/80 | HR 69 | Temp 98.1°F | Resp 12 | Ht 72.0 in | Wt 202.4 lb

## 2023-09-20 DIAGNOSIS — M10062 Idiopathic gout, left knee: Secondary | ICD-10-CM

## 2023-09-20 DIAGNOSIS — E782 Mixed hyperlipidemia: Secondary | ICD-10-CM

## 2023-09-20 DIAGNOSIS — I10 Essential (primary) hypertension: Secondary | ICD-10-CM

## 2023-09-20 DIAGNOSIS — R739 Hyperglycemia, unspecified: Secondary | ICD-10-CM

## 2023-09-20 DIAGNOSIS — R351 Nocturia: Secondary | ICD-10-CM | POA: Diagnosis not present

## 2023-09-20 DIAGNOSIS — Z0000 Encounter for general adult medical examination without abnormal findings: Secondary | ICD-10-CM | POA: Diagnosis not present

## 2023-09-20 DIAGNOSIS — G4700 Insomnia, unspecified: Secondary | ICD-10-CM | POA: Diagnosis not present

## 2023-09-20 DIAGNOSIS — L409 Psoriasis, unspecified: Secondary | ICD-10-CM

## 2023-09-20 MED ORDER — NYSTATIN 100000 UNIT/GM EX CREA
1.0000 | TOPICAL_CREAM | Freq: Two times a day (BID) | CUTANEOUS | 1 refills | Status: AC
  Filled 2023-09-20: qty 30, 15d supply, fill #0
  Filled 2023-12-29: qty 30, 15d supply, fill #1

## 2023-09-20 MED ORDER — METHYLPREDNISOLONE 4 MG PO TABS
ORAL_TABLET | ORAL | 0 refills | Status: DC
  Filled 2023-09-20: qty 15, fill #0

## 2023-09-20 MED ORDER — METHYLPREDNISOLONE 4 MG PO TBPK
ORAL_TABLET | ORAL | 0 refills | Status: AC
  Filled 2023-09-20: qty 21, 6d supply, fill #0

## 2023-09-20 MED ORDER — ZOLPIDEM TARTRATE 10 MG PO TABS: 10.0000 mg | ORAL_TABLET | Freq: Every day | ORAL | 5 refills | Status: AC

## 2023-09-20 NOTE — Progress Notes (Unsigned)
 Subjective:    Patient ID: Dakota [name], male    DOB: 1961/01/05, 63 y.o.   MRN: [id number]  Chief Complaint  Patient presents with   Annual Exam    HPI Discussed the use of AI scribe software for clinical note transcription with the patient, who gave verbal consent to proceed.  History of Present Illness [name] is a 63 year old male with psoriatic arthritis who presents for follow-up of joint pain, medication management and annual preventative exam.  Earlier this year, he experienced swelling in his left foot, prompting a visit to an orthopedic emergency facility. X-rays showed no fractures, and he was prescribed a prednisone  pack. The swelling was localized to the toe area, with no trauma reported. A subsequent referral to a rheumatologist led to an MRI and blood work, which ruled out gout due to normal uric acid levels. He reports that the rheumatologist diagnosed him with psoriatic arthritis, and he has a history of psoriasis, although his skin symptoms have resolved.  The foot symptoms have mostly improved, but he now experiences a nagging achiness in his shoulder, similar to the previous foot pain. He speculates that it might be related to his gym workouts, which he attends a few days a week. He manages the pain with indomethacin , Advil, and Tylenol , alternating them to avoid daily use of any single medication. Indomethacin  is effective but causes dizziness, and he ensures to take it with food and plenty of water.  He has a recent issue with his Ambien  prescription, which ran out and required a refill. He has been using Ambien  for sleep management.  Additionally, he has a persistent jock itch exacerbated by the summer heat. He has been using clotrimazole cream, Zeasorb powder, and Desitin, but the condition has not improved significantly. The rash affects both sides of the groin area.  He mentions a planned trip to [place] and expresses concern about potential sun exposure  exacerbating his skin condition, for which he has previously used a Medrol  dose pack.    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   Kidney stone on left side 09/19/2013   Left knee pain 12/25/2012   Mumps as a child   Onychomycosis 09/30/2014   Overweight 12/16/2015   Preventative health care 09/24/2013    Past Surgical History:  Procedure Laterality Date   COLONOSCOPY     COLONOSCOPY WITH PROPOFOL  N/A 12/04/2019   Procedure: COLONOSCOPY WITH PROPOFOL ;  Surgeon: Wilhelmenia Aloha Raddle., MD;  Location: [facility] ENDOSCOPY;  Service: Gastroenterology;  Laterality: N/A;  ultra slim scope    COLONOSCOPY WITH PROPOFOL  N/A 02/18/2023   Procedure: COLONOSCOPY WITH PROPOFOL ;  Surgeon: [name], Aloha Raddle., MD;  Location: [facility] ENDOSCOPY;  Service: Gastroenterology;  Laterality: N/A;  1 hour- abdominal binder/colowrap   CORONARY STENT PLACEMENT  01/14/2005   FLEXIBLE BRONCHOSCOPY W/ UPPER ENDOSCOPY  11/03/2021   LITHOTRIPSY  03/2014   POLYPECTOMY  12/04/2019   Procedure: POLYPECTOMY;  Surgeon: Wilhelmenia Aloha Raddle., MD;  Location: [facility] ENDOSCOPY;  Service: Gastroenterology;;   POLYPECTOMY  02/18/2023   Procedure: POLYPECTOMY;  Surgeon: Wilhelmenia Aloha Raddle., MD;  Location: [facility] ENDOSCOPY;  Service: Gastroenterology;;   UPPER GASTROINTESTINAL ENDOSCOPY  09/12/2019   Dan[facility]    Family History  Problem Relation Age of Onset   Multiple sclerosis Mother    Cancer Father        esophagus/ brain   Alcohol abuse Father        smoker   Esophageal cancer  Father    Tremor Father    Seizures Sister        AVM   COPD Sister    Alcohol abuse Sister        addiction, recovered   Tremor Son    Cancer Paternal Aunt        leukemia   Heart disease Maternal Grandfather        MI   Heart disease Maternal Uncle    Colon cancer Neg Hx    Colon polyps Neg Hx    Rectal cancer Neg Hx    Stomach cancer Neg Hx     Social History   Socioeconomic History   Marital status: Married    Spouse name: Not on file   Number of children: 2   Years of education: Not on file   Highest education level: Bachelor's degree (e.g., BA, AB, BS)  Occupational History   Occupation: Technical sales engineer  Tobacco Use   Smoking status: Former    Current packs/day: 0.00    Average packs/day: 0.5 packs/day for 20.0 years (10.0 ttl pk-yrs)    Types: Cigarettes, E-cigarettes    Start date: 02/20/1995    Quit date: 02/20/2015    Years since quitting: 8.5   Smokeless tobacco: Never   Tobacco comments:    Quit cigarettes on 02/20/2015  Vaping Use   Vaping status: Former  Substance and Sexual Activity   Alcohol use: Yes    Alcohol/week: 8.0 standard drinks of alcohol    Types: 4 Glasses of wine, 2 Cans of beer, 2 Shots of liquor per week    Comment: 2-3 per week   Drug use: No   Sexual activity: Not Currently    Partners: Female    Birth control/protection: None    Comment: wife, real Psychologist, occupational, no dairy, minimal fried foods  Other Topics Concern   Not on file  Social History Narrative   1 biological son   1 adopted child   3 step children   Social Drivers of Corporate investment banker Strain: Low Risk  (09/15/2023)   Overall Financial Resource Strain (CARDIA)    Difficulty of Paying Living Expenses: Not hard at all  Food Insecurity: No Food Insecurity (09/15/2023)   Hunger Vital Sign    Worried About Running Out of Food in the Last Year: Never true    Ran Out of Food in the Last Year: Never true  Transportation Needs: No Transportation Needs  (09/15/2023)   PRAPARE - Administrator, Civil Service (Medical): No    Lack of Transportation (Non-Medical): No  Physical Activity: Sufficiently Active (09/15/2023)   Exercise Vital Sign    Days of Exercise per Week: 5 days    Minutes of Exercise per Session: 60 min  Stress: Stress Concern Present (09/15/2023)   [employer] of Occupational Health - Occupational Stress Questionnaire    Feeling of Stress: To some extent  Social Connections: Moderately  Integrated (09/15/2023)   Social Connection and Isolation Panel    Frequency of Communication with Friends and Family: Twice a week    Frequency of Social Gatherings with Friends and Family: Twice a week    Attends Religious Services: More than 4 times per year    Active Member of [employer] or Organizations: No    Attends Engineer, structural: Not on file    Marital Status: Married  Intimate Partner Violence: Unknown (05/09/2021)   Received from [facility]   HITS    Physically Hurt: Not on file    Insult or Talk Down To: Not on file    Threaten Physical Harm: Not on file    Scream or Curse: Not on file    Outpatient Medications Prior to Visit  Medication Sig Dispense Refill   Allopurinol  200 MG TABS Take 2 tablets by mouth daily. 180 tablet 1   aspirin  EC 81 MG tablet Take 1 tablet (81 mg total) by mouth daily. Swallow whole. 90 tablet 3   atorvastatin  (LIPITOR) 80 MG tablet TAKE 1 TABLET DAILY 90 tablet 1   budesonide  (PULMICORT ) 0.5 MG/2ML nebulizer solution Take 8 mLs (2 mg total) by nebulization at bedtime. 240 mL 1   buPROPion  (WELLBUTRIN  XL) 300 MG 24 hr tablet TAKE 1 TABLET DAILY 90 tablet 3   cetirizine (ZYRTEC) 10 MG tablet Take 10 mg by mouth daily as needed for allergies.     clobetasol  ointment (TEMOVATE ) 0.05 % Apply 1 application topically 2 (two) times daily as needed (psoriasis). 60 g 2   clotrimazole (LOTRIMIN) 1 % cream Apply topically 2 (two) times daily.     desonide  (DESOWEN ) 0.05 % ointment  Apply 1 Application topically 2 (two) times daily. 15 g 0   ezetimibe  (ZETIA ) 10 MG tablet Take 1 tablet (10 mg total) by mouth daily. 90 tablet 3   hydrocortisone 2.5 % cream Apply topically 2 (two) times daily.     indomethacin  (INDOCIN ) 50 MG capsule TAKE 1 CAPSULE TWICE A DAY AS NEEDED 180 capsule 3   lansoprazole  (PREVACID ) 30 MG capsule Take 1 capsule (30 mg total) by mouth 2 (two) times daily before a meal. 60 capsule 2   losartan  (COZAAR ) 50 MG tablet TAKE 1 TABLET DAILY 90 tablet 3   Melatonin 10 MG CAPS Take 10 mg by mouth at bedtime as needed (sleep).     Meloxicam  15 MG TBDP Take by mouth as needed.     methylPREDNISolone  (MEDROL ) 4 MG tablet 5 tab po qd X 1d then 4 tab po qd X 1d then 3 tab po qd X 1d then 2 tab po qd then 1 tab po qd 15 tablet 0   metoCLOPramide  (REGLAN ) 5 MG tablet Take 1 tablet (5 mg total) by mouth once for 1 dose. 1 tablet 0   metoprolol  succinate (TOPROL -XL) 50 MG 24 hr tablet Take 1 tablet (50 mg total) by mouth daily. 90 tablet 3   Multiple Vitamin (MULTIVITAMIN WITH MINERALS) TABS tablet Take 1 tablet by mouth daily.     mupirocin  ointment (BACTROBAN ) 2 % Place 1 Application into the nose 2 (two) times daily. (Patient taking differently: Place 1 Application into the nose 2 (two) times daily as needed.) 30 g 1   polyethylene glycol powder (GLYCOLAX /MIRALAX ) 17 GM/SCOOP powder Take 255 g by mouth daily. 255 g 3   zolpidem  (AMBIEN ) 10 MG tablet Take 1 tablet (10 mg total) by mouth at bedtime. 30 tablet 1   No  facility-administered medications prior to visit.    Allergies  Allergen Reactions   Neosporin [Neomycin-Bacitracin Zn-Polymyx] Hives and Itching   Polysporin [Bacitracin-Polymyxin B] Hives and Itching   Amoxicillin Itching and Rash    Review of Systems  Constitutional:  Negative for chills, fever and malaise/fatigue.  HENT:  Negative for congestion and hearing loss.   Eyes:  Negative for discharge.  Respiratory:  Negative for cough, sputum  production and shortness of breath.   Cardiovascular:  Negative for chest pain, palpitations and leg swelling.  Gastrointestinal:  Negative for abdominal pain, blood in stool, constipation, diarrhea, heartburn, nausea and vomiting.  Genitourinary:  Negative for dysuria, frequency, hematuria and urgency.  Musculoskeletal:  Negative for back pain, falls and myalgias.  Skin:  Positive for itching and rash.  Neurological:  Negative for dizziness, sensory change, loss of consciousness, weakness and headaches.  Endo/Heme/Allergies:  Negative for environmental allergies. Does not bruise/bleed easily.  Psychiatric/Behavioral:  Negative for depression and suicidal ideas. The patient is not nervous/anxious and does not have insomnia.        Objective:    Physical Exam Vitals reviewed.  Constitutional:      General: He is not in acute distress.    Appearance: Normal appearance. He is not ill-appearing or diaphoretic.  HENT:     Head: Normocephalic and atraumatic.     Right Ear: Tympanic membrane, ear canal and external ear normal. There is no impacted cerumen.     Left Ear: Tympanic membrane, ear canal and external ear normal. There is no impacted cerumen.     Nose: Nose normal. No rhinorrhea.     Mouth/Throat:     Pharynx: Oropharynx is clear.  Eyes:     General: No scleral icterus.    Extraocular Movements: Extraocular movements intact.     Conjunctiva/sclera: Conjunctivae normal.     Pupils: Pupils are equal, round, and reactive to light.  Neck:     Thyroid : No thyroid  mass or thyroid  tenderness.  Cardiovascular:     Rate and Rhythm: Normal rate and regular rhythm.     Pulses: Normal pulses.     Heart sounds: Normal heart sounds. No murmur heard. Pulmonary:     Effort: Pulmonary effort is normal.     Breath sounds: Normal breath sounds. No wheezing.  Abdominal:     General: Bowel sounds are normal.     Palpations: Abdomen is soft. There is no mass.     Tenderness: There is no  guarding.  Musculoskeletal:        General: No swelling. Normal range of motion.     Cervical back: Normal range of motion and neck supple. No rigidity.     Right lower leg: No edema.     Left lower leg: No edema.  Lymphadenopathy:     Cervical: No cervical adenopathy.  Skin:    General: Skin is warm and dry.     Findings: No rash.  Neurological:     General: No focal deficit present.     Mental Status: He is alert and oriented to person, place, and time.     Cranial Nerves: No cranial nerve deficit.     Deep Tendon Reflexes: Reflexes normal.  Psychiatric:        Mood and Affect: Mood normal.        Behavior: Behavior normal.     BP 138/80 (BP Location: Right Arm, Patient Position: Sitting, Cuff Size: Normal)   Pulse 69   Temp 98.1 F (36.7  C) (Oral)   Resp 12   Ht 6' (1.829 m)   Wt 202 lb 6.4 oz (91.8 kg)   SpO2 95%   BMI 27.45 kg/m  Wt Readings from Last 3 Encounters:  09/20/23 202 lb 6.4 oz (91.8 kg)  02/18/23 195 lb 1.7 oz (88.5 kg)  12/29/22 197 lb (89.4 kg)    Diabetic Foot Exam - Simple   No data filed    Lab Results  Component Value Date   WBC 6.5 05/11/2023   HGB 14.6 05/11/2023   HCT 45.0 05/11/2023   PLT 254.0 05/11/2023   GLUCOSE 101 (H) 05/11/2023   CHOL 164 05/11/2023   TRIG 168.0 (H) 05/11/2023   HDL 43.30 05/11/2023   LDLDIRECT 149.4 09/13/2007   LDLCALC 87 05/11/2023   ALT 36 05/11/2023   AST 21 05/11/2023   NA 138 05/11/2023   K 4.6 05/11/2023   CL 102 05/11/2023   CREATININE 0.97 05/11/2023   BUN 26 (H) 05/11/2023   CO2 28 05/11/2023   TSH 1.65 02/09/2023   PSA 0.37 01/29/2022   HGBA1C 6.1 05/11/2023    Lab Results  Component Value Date   TSH 1.65 02/09/2023   Lab Results  Component Value Date   WBC 6.5 05/11/2023   HGB 14.6 05/11/2023   HCT 45.0 05/11/2023   MCV 97.1 05/11/2023   PLT 254.0 05/11/2023   Lab Results  Component Value Date   NA 138 05/11/2023   K 4.6 05/11/2023   CO2 28 05/11/2023   GLUCOSE 101 (H)  05/11/2023   BUN 26 (H) 05/11/2023   CREATININE 0.97 05/11/2023   BILITOT 0.4 05/11/2023   ALKPHOS 96 05/11/2023   AST 21 05/11/2023   ALT 36 05/11/2023   PROT 7.0 05/11/2023   ALBUMIN 4.3 05/11/2023   CALCIUM  9.3 05/11/2023   ANIONGAP 8 03/11/2014   GFR 83.36 05/11/2023   Lab Results  Component Value Date   CHOL 164 05/11/2023   Lab Results  Component Value Date   HDL 43.30 05/11/2023   Lab Results  Component Value Date   LDLCALC 87 05/11/2023   Lab Results  Component Value Date   TRIG 168.0 (H) 05/11/2023   Lab Results  Component Value Date   CHOLHDL 4 05/11/2023   Lab Results  Component Value Date   HGBA1C 6.1 05/11/2023       Assessment & Plan:  Essential hypertension Assessment & Plan: Monitor and report any concerns, no changes to meds. Encouraged heart healthy diet such as the DASH diet and exercise as tolerated.    Acute idiopathic gout of left knee Assessment & Plan: Hydrate and monitor tolerating Allopurinol    Insomnia, unspecified type Assessment & Plan: Encouraged good sleep hygiene such as dark, quiet room. No blue/green glowing lights such as computer screens in bedroom. No alcohol or stimulants in evening. Cut down on caffeine as able. Regular exercise is helpful but not just prior to bed time. May continue using Ambien  prn   Hyperlipidemia, mixed Assessment & Plan: Tolerating statin, encouraged heart healthy diet, avoid trans fats, minimize simple carbs and saturated fats. Increase exercise as tolerated tolerating Atorvastatin    Hyperglycemia Assessment & Plan: hgba1c acceptable, minimize simple carbs. Increase exercise as tolerated.    Preventative health care Assessment & Plan: Patient encouraged to maintain heart healthy diet, regular exercise, adequate sleep. Consider daily probiotics. Take medications as prescribed. Labs ordered and reviewed  Colonoscopy 2025 repeat in 2030 Given and reviewed copy of ACP documents from [place]  Secretary of State and encouraged to complete and return      Assessment and Plan Assessment & Plan Adult Wellness Visit Routine wellness visit with discussion of recent health issues including psoriatic arthritis, tinea cruris, and insomnia. Reviewed medications and health maintenance. - Order PSA test - Encourage tetanus vaccination after return from vacation - Recommend flu and COVID vaccinations in the fall - Discuss Prevnar 20 vaccination for pneumonia due to heart disease risk  Psoriatic arthritis Diagnosed by rheumatologist with symptoms of foot swelling and shoulder achiness. Indomethacin  provides relief. Differential diagnosis included gout and pseudo gout, ruled out by MRI and blood work. Colchicine was prescribed but not used due to concerns about its appropriateness for psoriatic arthritis. 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 Previous treatments with clotrimazole and Desitin were ineffective. - Prescribe nystatin  cream with refill - Consider oral antifungal if cream is insufficient  Insomnia Chronic insomnia managed with zolpidem . Discussed alternative therapies including cognitive behavioral therapy for insomnia (CBTI). - Refill zolpidem  prescription with 5 refills - Recommend CBTI Coach app for insomnia management  Recording duration: 27 minutes     Harlene Horton, MD

## 2023-09-20 NOTE — Patient Instructions (Addendum)
 CBTinsomnia APP by the [employer] for sleep  Prevnar 20 Tdap Flu and covid in fall  Preventive Care 35-63 Years Old, Male Preventive care refers to lifestyle choices and visits with your health care provider that can promote health and wellness. Preventive care visits are also called wellness exams. What can I expect for my preventive care visit? Counseling During your preventive care visit, your health care provider may ask about your: Medical history, including: Past medical problems. Family medical history. Current health, including: Emotional well-being. Home life and relationship well-being. Sexual activity. Lifestyle, including: Alcohol, nicotine or tobacco, and drug use. Access to firearms. Diet, exercise, and sleep habits. Safety issues such as seatbelt and bike helmet use. Sunscreen use. Work and work Astronomer. Physical exam Your health care provider will check your: Height and weight. These may be used to calculate your BMI (body mass index). BMI is a measurement that tells if you are at a healthy weight. Waist circumference. This measures the distance around your waistline. This measurement also tells if you are at a healthy weight and may help predict your risk of certain diseases, such as type 2 diabetes and high blood pressure. Heart rate and blood pressure. Body temperature. Skin for abnormal spots. What immunizations do I need?  Vaccines are usually given at various ages, according to a schedule. Your health care provider will recommend vaccines for you based on your age, medical history, and lifestyle or other factors, such as travel or where you work. What tests do I need? Screening Your health care provider may recommend screening tests for certain conditions. This may include: Lipid and cholesterol levels. Diabetes screening. This is done by checking your blood sugar (glucose) after you have not eaten for a while (fasting). Hepatitis B  test. Hepatitis C test. HIV (human immunodeficiency virus) test. STI (sexually transmitted infection) testing, if you are at risk. Lung cancer screening. Prostate cancer screening. Colorectal cancer screening. Talk with your health care provider about your test results, treatment options, and if necessary, the need for more tests. Follow these instructions at home: Eating and drinking  Eat a diet that includes fresh fruits and vegetables, whole grains, lean protein, and low-fat dairy products. Take vitamin and mineral supplements as recommended by your health care provider. Do not drink alcohol if your health care provider tells you not to drink. If you drink alcohol: Limit how much you have to 63-2 drinks a day. Know how much alcohol is in your drink. In the [place] one drink equals one 12 oz bottle of beer (355 mL), one 5 oz glass of wine (148 mL), or one 1 oz glass of hard liquor (44 mL). Lifestyle Brush your teeth every morning and night with fluoride toothpaste. Floss one time each day. Exercise for at least 30 minutes 5 or more days each week. Do not use any products that contain nicotine or tobacco. These products include cigarettes, chewing tobacco, and vaping devices, such as e-cigarettes. If you need help quitting, ask your health care provider. Do not use drugs. If you are sexually active, practice safe sex. Use a condom or other form of protection to prevent STIs. Take aspirin  only as told by your health care provider. Make sure that you understand how much to take and what form to take. Work with your health care provider to find out whether it is safe and beneficial for you to take aspirin  daily. Find healthy ways to manage stress, such as: Meditation, yoga, or listening to music. Journaling.  Talking to a trusted person. Spending time with friends and family. Minimize exposure to UV radiation to reduce your risk of skin cancer. Safety Always wear your seat belt while  driving or riding in a vehicle. Do not drive: If you have been drinking alcohol. Do not ride with someone who has been drinking. When you are tired or distracted. While texting. If you have been using any mind-altering substances or drugs. Wear a helmet and other protective equipment during sports activities. If you have firearms in your house, make sure you follow all gun safety procedures. What's next? Go to your health care provider once a year for an annual wellness visit. Ask your health care provider how often you should have your eyes and teeth checked. Stay up to date on all vaccines. This information is not intended to replace advice given to you by your health care provider. Make sure you discuss any questions you have with your health care provider. Document Revised: 07/17/2020 Document Reviewed: 63/15/2022 Elsevier Patient Education  2024 [employer].

## 2023-09-21 ENCOUNTER — Ambulatory Visit: Payer: Self-pay | Admitting: Family Medicine

## 2023-09-21 LAB — LIPID PANEL
Cholesterol: 137 mg/dL (ref 0–200)
HDL: 38.3 mg/dL — ABNORMAL LOW (ref >39.00)
LDL Cholesterol: 69 mg/dL (ref 0–99)
NonHDL: 98.49
Total CHOL/HDL Ratio: 4
Triglycerides: 145 mg/dL (ref 0.0–149.0)
VLDL: 29 mg/dL (ref 0.0–40.0)

## 2023-09-21 LAB — COMPREHENSIVE METABOLIC PANEL WITH GFR
ALT: 28 U/L (ref 0–53)
AST: 23 U/L (ref 0–37)
Albumin: 4.2 g/dL (ref 3.5–5.2)
Alkaline Phosphatase: 101 U/L (ref 39–117)
BUN: 15 mg/dL (ref 6–23)
CO2: 27 meq/L (ref 19–32)
Calcium: 9.4 mg/dL (ref 8.4–10.5)
Chloride: 102 meq/L (ref 96–112)
Creatinine, Ser: 1.03 mg/dL (ref 0.40–1.50)
GFR: 77.37 mL/min (ref >60.00)
Glucose, Bld: 97 mg/dL (ref 70–99)
Potassium: 4.2 meq/L (ref 3.5–5.1)
Sodium: 139 meq/L (ref 135–145)
Total Bilirubin: 0.5 mg/dL (ref 0.2–1.2)
Total Protein: 6.9 g/dL (ref 6.0–8.3)

## 2023-09-21 LAB — CBC WITH DIFFERENTIAL/PLATELET
Basophils Absolute: 0.1 K/uL (ref 0.0–0.1)
Basophils Relative: 0.9 % (ref 0.0–3.0)
Eosinophils Absolute: 0.5 K/uL (ref 0.0–0.7)
Eosinophils Relative: 7.5 % — ABNORMAL HIGH (ref 0.0–5.0)
HCT: 41 % (ref 39.0–52.0)
Hemoglobin: 13.4 g/dL (ref 13.0–17.0)
Lymphocytes Relative: 27.4 % (ref 12.0–46.0)
Lymphs Abs: 1.9 K/uL (ref 0.7–4.0)
MCHC: 32.7 g/dL (ref 30.0–36.0)
MCV: 94.6 fl (ref 78.0–100.0)
Monocytes Absolute: 0.9 K/uL (ref 0.1–1.0)
Monocytes Relative: 12.6 % — ABNORMAL HIGH (ref 3.0–12.0)
Neutro Abs: 3.6 K/uL (ref 1.4–7.7)
Neutrophils Relative %: 51.6 % (ref 43.0–77.0)
Platelets: 262 K/uL (ref 150.0–400.0)
RBC: 4.34 Mil/uL (ref 4.22–5.81)
RDW: 13.5 % (ref 11.5–15.5)
WBC: 7 K/uL (ref 4.0–10.5)

## 2023-09-21 LAB — TSH: TSH: 2.11 u[IU]/mL (ref 0.35–5.50)

## 2023-09-21 LAB — HEMOGLOBIN A1C: Hgb A1c MFr Bld: 6.1 % (ref 4.6–6.5)

## 2023-09-21 LAB — PSA: PSA: 0.78 ng/mL (ref 0.10–4.00)

## 2023-09-27 ENCOUNTER — Other Ambulatory Visit: Payer: Self-pay | Admitting: Cardiology

## 2023-10-07 ENCOUNTER — Encounter: Payer: Self-pay | Admitting: Family Medicine

## 2023-10-08 ENCOUNTER — Other Ambulatory Visit (HOSPITAL_BASED_OUTPATIENT_CLINIC_OR_DEPARTMENT_OTHER): Payer: Self-pay

## 2023-10-08 ENCOUNTER — Other Ambulatory Visit: Payer: Self-pay | Admitting: Family

## 2023-10-08 MED ORDER — METHYLPREDNISOLONE 4 MG PO TBPK
ORAL_TABLET | ORAL | 0 refills | Status: AC
  Filled 2023-10-08: qty 21, 6d supply, fill #0

## 2023-11-03 ENCOUNTER — Other Ambulatory Visit (HOSPITAL_BASED_OUTPATIENT_CLINIC_OR_DEPARTMENT_OTHER): Payer: Self-pay

## 2023-11-03 ENCOUNTER — Encounter: Payer: Self-pay | Admitting: Cardiology

## 2023-11-03 MED ORDER — METOPROLOL SUCCINATE ER 50 MG PO TB24
50.0000 mg | ORAL_TABLET | Freq: Every day | ORAL | 0 refills | Status: DC
  Filled 2023-11-03: qty 30, 30d supply, fill #0

## 2023-11-04 ENCOUNTER — Other Ambulatory Visit: Payer: Self-pay | Admitting: *Deleted

## 2023-11-09 ENCOUNTER — Ambulatory Visit: Admitting: Gastroenterology

## 2023-11-09 ENCOUNTER — Encounter: Payer: Self-pay | Admitting: Gastroenterology

## 2023-11-09 VITALS — BP 130/80 | HR 74 | Ht 72.0 in | Wt 202.0 lb

## 2023-11-09 DIAGNOSIS — R1319 Other dysphagia: Secondary | ICD-10-CM

## 2023-11-09 DIAGNOSIS — R1310 Dysphagia, unspecified: Secondary | ICD-10-CM

## 2023-11-09 DIAGNOSIS — K200 Eosinophilic esophagitis: Secondary | ICD-10-CM

## 2023-11-09 MED ORDER — BUDESONIDE 0.5 MG/2ML IN SUSP: 2.0000 mg | Freq: Every day | RESPIRATORY_TRACT | 1 refills | Status: DC

## 2023-11-09 NOTE — Patient Instructions (Signed)
 You have been scheduled for an endoscopy. Please follow written instructions given to you at your visit today.  If you use inhalers (even only as needed), please bring them with you on the day of your procedure.  If you take any of the following medications, they will need to be adjusted prior to your procedure:   DO NOT TAKE 7 DAYS PRIOR TO TEST- Trulicity (dulaglutide) Ozempic, Wegovy (semaglutide) Mounjaro (tirzepatide) Bydureon Bcise (exanatide extended release)  DO NOT TAKE 1 DAY PRIOR TO YOUR TEST Rybelsus (semaglutide) Adlyxin (lixisenatide) Victoza (liraglutide) Byetta (exanatide) ___________________________________________________________________________  _______________________________________________________  If your blood pressure at your visit was 140/90 or greater, please contact your primary care physician to follow up on this.  _______________________________________________________  If you are age 63 or older, your body mass index should be between 23-30. Your Body mass index is 27.4 kg/m. If this is out of the aforementioned range listed, please consider follow up with your Primary Care Provider.  If you are age 90 or younger, your body mass index should be between 19-25. Your Body mass index is 27.4 kg/m. If this is out of the aformentioned range listed, please consider follow up with your Primary Care Provider.   ________________________________________________________  The [facility] providers would like to encourage you to use MYCHART to communicate with providers for non-urgent requests or questions.  Due to long hold times on the telephone, sending your provider a message by [facility] may be a faster and more efficient way to get a response.  Please allow 48 business hours for a response.  Please remember that this is for non-urgent requests.  _______________________________________________________  [facility] is using a team-based approach to  care.  Your team is made up of your doctor and two to three APPS. Our APPS (Nurse Practitioners and Physician Assistants) work with your physician to ensure care continuity for you. They are fully qualified to address your health concerns and develop a treatment plan. They communicate directly with your gastroenterologist to care for you. Seeing the Advanced Practice Practitioners on your physician's team can help you by facilitating care more promptly, often allowing for earlier appointments, access to diagnostic testing, procedures, and other specialty referrals.    Thank you for trusting me with your gastrointestinal care!    Dr. Victory Legrand DOUGLAS [facility]

## 2023-11-09 NOTE — Progress Notes (Signed)
 [facility] Progress Note  Chief Complaint: Dysphagia and eosinophilic esophagitis  Subjective  Prior history  History of colon polyps, sigmoid tortuosity and stricturing with challenging scope passage.  Subsequent colonoscopies with Dr. Wilhelmenia, most recently January 2025  Eosinophilic esophagitis, last seen in the office January 2024 after December 2023 EGD showed significant improvement in mucosal appearance and reduction in eosinophilic infiltration on esophageal mucosal biopsies while on oral budesonide  slurry.  Patient was continuing the treatment at that time.  Discussed the use of AI scribe software for clinical note transcription with the patient, who gave verbal consent to proceed.  History of Present Illness Dakota [name] is a 63 year old male with eosinophilic esophagitis who presents with swallowing difficulties.  He has experienced a recurrence of swallowing difficulties over the past few months, characterized by episodes of regurgitating food due to an inability to swallow. These episodes occur more frequently when he rushes while eating, necessitating thorough chewing of each bite, up to twenty times.  Certain foods may cause more dysphagia than others. He has been off budesonide  liquid treatment for eosinophilic esophagitis for approximately six months, after initially receiving a large supply that lasted about a year. During the time he was on budesonide , his swallowing issues improved. He occasionally uses prednisone , which was prescribed prior to budesonide , but is currently out of the budesonide  liquid.  No unexpected weight loss has been noted, and his weight has remained steady despite the swallowing difficulties.    ROS: Cardiovascular:  no chest pain Respiratory: no dyspnea  The patient's Past Medical, Family and Social History were reviewed and are on file in the EMR. 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   Kidney stone on left side 09/19/2013   Left knee pain 12/25/2012   Mumps as a child   Onychomycosis 09/30/2014   Overweight 12/16/2015   Preventative health care 09/24/2013    Past Surgical History:  Procedure Laterality Date   COLONOSCOPY     COLONOSCOPY WITH PROPOFOL  N/A 12/04/2019   Procedure: COLONOSCOPY WITH PROPOFOL ;  Surgeon: [name], Aloha Raddle., MD;  Location: [facility] ENDOSCOPY;  Service: Gastroenterology;  Laterality: N/A;  ultra slim scope    COLONOSCOPY WITH PROPOFOL  N/A 02/18/2023   Procedure: COLONOSCOPY WITH PROPOFOL ;  Surgeon: [name], Aloha Raddle., MD;  Location: [facility] ENDOSCOPY;  Service: Gastroenterology;  Laterality: N/A;  1 hour- abdominal binder/colowrap   CORONARY STENT PLACEMENT  01/14/2005   FLEXIBLE BRONCHOSCOPY W/ UPPER ENDOSCOPY  11/03/2021   LITHOTRIPSY  03/2014   POLYPECTOMY  12/04/2019   Procedure: POLYPECTOMY;  Surgeon: Wilhelmenia Aloha Raddle., MD;  Location: [facility]COPY;  Service: Gastroenterology;;   POLYPECTOMY  02/18/2023   Procedure: POLYPECTOMY;  Surgeon: Wilhelmenia Aloha Raddle., MD;  Location: [facility] ENDOSCOPY;  Service: Gastroenterology;;   UPPER GASTROINTESTINAL ENDOSCOPY  09/12/2019   [facility]     Objective:  Med list reviewed  Current Outpatient Medications:    Allopurinol  200 MG TABS, Take  2 tablets by mouth daily., Disp: 180 tablet, Rfl: 1   aspirin  EC 81 MG tablet, Take 1 tablet (81 mg total) by mouth daily. Swallow whole., Disp: 90 tablet, Rfl: 3   atorvastatin  (LIPITOR) 80 MG tablet, TAKE 1 TABLET DAILY, Disp: 90 tablet, Rfl: 1    buPROPion  (WELLBUTRIN  XL) 300 MG 24 hr tablet, TAKE 1 TABLET DAILY, Disp: 90 tablet, Rfl: 3   cetirizine (ZYRTEC) 10 MG tablet, Take 10 mg by mouth daily as needed for allergies., Disp: , Rfl:    clobetasol  ointment (TEMOVATE ) 0.05 %, Apply 1 application topically 2 (two) times daily as needed (psoriasis)., Disp: 60 g, Rfl: 2   clotrimazole (LOTRIMIN) 1 % cream, Apply topically 2 (two) times daily., Disp: , Rfl:    desonide  (DESOWEN ) 0.05 % ointment, Apply 1 Application topically 2 (two) times daily., Disp: 15 g, Rfl: 0   ezetimibe  (ZETIA ) 10 MG tablet, Take 1 tablet (10 mg total) by mouth daily., Disp: 90 tablet, Rfl: 3   hydrocortisone 2.5 % cream, Apply topically 2 (two) times daily., Disp: , Rfl:    indomethacin  (INDOCIN ) 50 MG capsule, TAKE 1 CAPSULE TWICE A DAY AS NEEDED, Disp: 180 capsule, Rfl: 3   lansoprazole  (PREVACID ) 30 MG capsule, Take 1 capsule (30 mg total) by mouth 2 (two) times daily before a meal., Disp: 60 capsule, Rfl: 2   losartan  (COZAAR ) 50 MG tablet, TAKE 1 TABLET DAILY, Disp: 90 tablet, Rfl: 3   Melatonin 10 MG CAPS, Take 10 mg by mouth at bedtime as needed (sleep)., Disp: , Rfl:    methylPREDNISolone  (MEDROL  DOSEPAK) 4 MG TBPK tablet, Take as directed per package instructions, Disp: 21 tablet, Rfl: 0   metoprolol  succinate (TOPROL -XL) 50 MG 24 hr tablet, Take 1 tablet (50 mg total) by mouth daily., Disp: 30 tablet, Rfl: 0   Multiple Vitamin (MULTIVITAMIN WITH MINERALS) TABS tablet, Take 1 tablet by mouth daily., Disp: , Rfl:    mupirocin  ointment (BACTROBAN ) 2 %, Place 1 Application into the nose 2 (two) times daily. (Patient taking differently: Place 1 Application into the nose 2 (two) times daily as needed.), Disp: 30 g, Rfl: 1   nystatin  cream (MYCOSTATIN ), Apply 1 Application topically 2 (two) times daily., Disp: 30 g, Rfl: 1   polyethylene glycol powder (GLYCOLAX /MIRALAX ) 17 GM/SCOOP powder, Take 255 g by mouth daily., Disp: 255 g, Rfl: 3   zolpidem  (AMBIEN ) 10 MG  tablet, Take 1 tablet (10 mg total) by mouth at bedtime., Disp: 30 tablet, Rfl: 5   budesonide  (PULMICORT ) 0.5 MG/2ML nebulizer solution, Take 8 mLs (2 mg total) by nebulization at bedtime., Disp: 240 mL, Rfl: 1   Meloxicam  15 MG TBDP, Take by mouth as needed. (Patient not taking: Reported on 11/09/2023), Disp: , Rfl:    metoCLOPramide  (REGLAN ) 5 MG tablet, Take 1 tablet (5 mg total) by mouth once for 1 dose. (Patient not taking: Reported on 11/09/2023), Disp: 1 tablet, Rfl: 0   Vital signs in last 24 hrs: Vitals:   11/09/23 1358  BP: 130/80  Pulse: 74   Wt Readings from Last 3 Encounters:  11/09/23 202 lb (91.6 kg)  09/20/23 202 lb 6.4 oz (91.8 kg)  02/18/23 195 lb 1.7 oz (88.5 kg)    Physical Exam   Cardiac: Regular without appreciable murmur,  no peripheral edema Pulm: clear to auscultation bilaterally, normal RR and effort noted Abdomen: soft, no tenderness, with active bowel sounds. No guarding or palpable hepatosplenomegaly. Skin; warm and dry, no jaundice or  rash   Labs:   ___________________________________________ Radiologic studies:   ____________________________________________ Other:   _____________________________________________   Encounter Diagnoses  Name Primary?   Eosinophilic esophagitis Yes   Esophageal dysphagia     Assessment and Plan Assessment & Plan Eosinophilic esophagitis with dysphagia Esophageal inflammation causing dysphagia.  Possible stricture development.  Symptoms recurred post-budesonide  discontinuation. Risk of esophageal stricture and motility issues. - Restart budesonide  slurry 2 mg nightly.  Recommended doing so at bedtime, then nothing to eat or drink afterwards.  Rinse out mouth and spit after using the medicine. - Schedule upper endoscopy 4-6 weeks post-budesonide  restart  He was agreeable after discussion of EGD and risks  The benefits and risks of the planned procedure(s) were described in detail with the patient or (when  appropriate) their health care proxy.  Risks were outlined as including, but not limited to, bleeding, infection, perforation, adverse medication reaction leading to cardiac or pulmonary decompensation, pancreatitis (if ERCP).  The limitation of incomplete mucosal visualization was also discussed.  No guarantees or warranties were given.   Victory LITTIE Brand III

## 2023-11-12 ENCOUNTER — Encounter: Payer: Self-pay | Admitting: Gastroenterology

## 2023-11-12 ENCOUNTER — Other Ambulatory Visit (HOSPITAL_BASED_OUTPATIENT_CLINIC_OR_DEPARTMENT_OTHER): Payer: Self-pay

## 2023-11-12 MED ORDER — BUDESONIDE 0.5 MG/2ML IN SUSP
2.0000 mg | Freq: Every day | RESPIRATORY_TRACT | 1 refills | Status: DC
  Filled 2023-11-12: qty 120, 15d supply, fill #0

## 2023-11-17 ENCOUNTER — Other Ambulatory Visit: Payer: Self-pay

## 2023-11-19 ENCOUNTER — Other Ambulatory Visit: Payer: Self-pay

## 2023-11-19 MED ORDER — METOPROLOL SUCCINATE ER 50 MG PO TB24: 50.0000 mg | ORAL_TABLET | Freq: Every day | ORAL | 0 refills | Status: DC

## 2023-12-05 ENCOUNTER — Encounter: Payer: Self-pay | Admitting: Cardiology

## 2023-12-06 ENCOUNTER — Other Ambulatory Visit: Payer: Self-pay

## 2023-12-06 MED ORDER — METOPROLOL SUCCINATE ER 50 MG PO TB24: 50.0000 mg | ORAL_TABLET | Freq: Every day | ORAL | 0 refills | Status: DC

## 2023-12-06 NOTE — Telephone Encounter (Signed)
 RX sent in

## 2023-12-08 ENCOUNTER — Encounter: Payer: Self-pay | Admitting: Family Medicine

## 2023-12-14 ENCOUNTER — Encounter: Payer: Self-pay | Admitting: Gastroenterology

## 2023-12-21 ENCOUNTER — Encounter: Admitting: Gastroenterology

## 2023-12-21 ENCOUNTER — Encounter: Payer: Self-pay | Admitting: Gastroenterology

## 2023-12-21 ENCOUNTER — Ambulatory Visit: Admitting: Gastroenterology

## 2023-12-21 VITALS — BP 117/70 | HR 67 | Temp 97.3°F | Resp 15 | Ht 72.0 in | Wt 202.0 lb

## 2023-12-21 DIAGNOSIS — K3189 Other diseases of stomach and duodenum: Secondary | ICD-10-CM

## 2023-12-21 DIAGNOSIS — K200 Eosinophilic esophagitis: Secondary | ICD-10-CM | POA: Diagnosis not present

## 2023-12-21 DIAGNOSIS — K2289 Other specified disease of esophagus: Secondary | ICD-10-CM

## 2023-12-21 DIAGNOSIS — R1310 Dysphagia, unspecified: Secondary | ICD-10-CM

## 2023-12-21 DIAGNOSIS — K222 Esophageal obstruction: Secondary | ICD-10-CM

## 2023-12-21 DIAGNOSIS — R1319 Other dysphagia: Secondary | ICD-10-CM

## 2023-12-21 MED ORDER — LANSOPRAZOLE 30 MG PO CPDR
30.0000 mg | DELAYED_RELEASE_CAPSULE | Freq: Every day | ORAL | 3 refills | Status: AC
Start: 2023-12-21 — End: ?

## 2023-12-21 MED ORDER — SODIUM CHLORIDE 0.9 % IV SOLN: 500.0000 mL | Freq: Once | INTRAVENOUS | Status: DC

## 2023-12-21 NOTE — Patient Instructions (Addendum)
 Resume previous diet. However, any meat consumed should always be ground to decrease the chance of food impaction. Continue present medications. Await pathology results. Resume prevacid  30 mg once daily.   YOU HAD AN ENDOSCOPIC PROCEDURE TODAY AT THE [facility]:   Refer to the procedure report that was given to you for any specific questions about what was found during the examination.  If the procedure report does not answer your questions, please call your gastroenterologist to clarify.  If you requested that your care partner not be given the details of your procedure findings, then the procedure report has been included in a sealed envelope for you to review at your convenience later.  YOU SHOULD EXPECT: Some feelings of bloating in the abdomen. Passage of more gas than usual.  Walking can help get rid of the air that was put into your GI tract during the procedure and reduce the bloating. If you had a lower endoscopy (such as a colonoscopy or flexible sigmoidoscopy) you may notice spotting of blood in your stool or on the toilet paper. If you underwent a bowel prep for your procedure, you may not have a normal bowel movement for a few days.  Please Note:  You might notice some irritation and congestion in your nose or some drainage.  This is from the oxygen used during your procedure.  There is no need for concern and it should clear up in a day or so.  SYMPTOMS TO REPORT IMMEDIATELY:  Following upper endoscopy (EGD)  Vomiting of blood or coffee ground material  New chest pain or pain under the shoulder blades  Painful or persistently difficult swallowing  New shortness of breath  Fever of 100F or higher  Black, tarry-looking stools  For urgent or emergent issues, a gastroenterologist can be reached at any hour by calling [phone]. Do not use MyChart messaging for urgent concerns.    DIET:  We do recommend a small meal at first, but then you may proceed to your  regular diet.  Drink plenty of fluids but you should avoid alcoholic beverages for 24 hours.  ACTIVITY:  You should plan to take it easy for the rest of today and you should NOT DRIVE or use heavy machinery until tomorrow (because of the sedation medicines used during the test).    FOLLOW UP: Our staff will call the number listed on your records the next business day following your procedure.  We will call around 7:15- 8:00 am to check on you and address any questions or concerns that you may have regarding the information given to you following your procedure. If we do not reach you, we will leave a message.     If any biopsies were taken you will be contacted by phone or by letter within the next 1-3 weeks.  Please call us  at [phone] if you have not heard about the biopsies in 3 weeks.    SIGNATURES/CONFIDENTIALITY: You and/or your care partner have signed paperwork which will be entered into your electronic medical record.  These signatures attest to the fact that that the information above on your After Visit Summary has been reviewed and is understood.  Full responsibility of the confidentiality of this discharge information lies with you and/or your care-partner.

## 2023-12-21 NOTE — Op Note (Signed)
 [facility] Patient Name: Dakota [name] Procedure Date: 12/21/2023 8:28 AM MRN: [id number] Endoscopist: Victory L. Legrand , MD, [id number] Age: 63 Referring MD:  Date of Birth: 09/15/1960 Gender: Male Account #: [id number] Procedure:                Upper GI endoscopy Indications:              Esophageal dysphagia, Eosinophilic esophagitis,                            Evaluation of eosinophilic esophagitis                           see recent office note for details                           current on nudesonide slurry 2mg  QHS and off PPI Medicines:                Monitored Anesthesia Care Procedure:                Pre-Anesthesia Assessment:                           - Prior to the procedure, a History and Physical                            was performed, and patient medications and                            allergies were reviewed. The patient's tolerance of                            previous anesthesia was also reviewed. The risks                            and benefits of the procedure and the sedation                            options and risks were discussed with the patient.                            All questions were answered, and informed consent                            was obtained. Prior Anticoagulants: The patient has                            taken no anticoagulant or antiplatelet agents. ASA                            Grade Assessment: II - A patient with mild systemic                            disease. After reviewing the risks and benefits,  the patient was deemed in satisfactory condition to                            undergo the procedure.                           After obtaining informed consent, the endoscope was                            passed under direct vision. Throughout the                            procedure, the patient's blood pressure, pulse, and                            oxygen saturations were monitored  continuously. The                            Olympus scope [id number] was introduced through the                            mouth, and advanced to the second part of duodenum.                            The upper GI endoscopy was accomplished without                            difficulty. The patient tolerated the procedure                            well. Scope In: Scope Out: Findings:                 Mucosal changes including longitudinal furrows,                            circumferential folds and congestion (edema) were                            found in the entire esophagus. Esophageal findings                            were graded using the Eosinophilic Esophagitis                            Endoscopic Reference Score (EoE-EREFS) as: Edema                            Grade 1 Present (decreased clarity or absence of                            vascular markings), Rings Grade 1 Mild (subtle                            circumferential ridges seen on esophageal  distension), Exudates Grade 1 Mild (scattered white                            lesions involving less than 10 percent of the                            esophageal surface area), Furrows Grade 1 Mild                            (vertical lines without visible depth) and                            Stricture present (at EGJ, about 12mm diameter,                            subtle and initially difficult to see due to                            mucosal edema and peristalsis).                           There were also distal esophageal erosions                            suggestive of GERD.                           Several biopsies were obtained in the upper third                            of the esophagus, in the middle third of the                            esophagus and in the lower third of the esophagus                            with cold forceps for evaluation of eosinophilic                             esophagitis. A guidewire was placed and the scope                            was withdrawn. Dilation was performed with a Savary                            dilator with no resistance at 12 mm, mild                            resistance at 14 mm and moderate resistance at 16                            mm. The dilation site was examined and showed mild  mucosal disruption and mild improvement in luminal                            narrowing.                           Patchy mildly erythematous mucosa with scant flecks                            of heme was found in the gastric antrum.                           The exam of the stomach was otherwise normal.                            Distended well with insufflation                           The examined duodenum was normal. Complications:            No immediate complications. Estimated Blood Loss:     Estimated blood loss was minimal. Impression:               - Esophageal mucosal changes secondary to                            eosinophilic esophagitis. Dilated.                           - Erythematous mucosa in the antrum.                           - Normal examined duodenum.                           - Several biopsies were obtained in the upper third                            of the esophagus, in the middle third of the                            esophagus and in the lower third of the esophagus.                           Endoscopic appearance suggests persistent EoE                            activity with overlying GERD. Bx will hopefully                            assist with guiding therapy giong forward. Recommendation:           - Patient has a contact number available for                            emergencies. The signs and symptoms of potential  delayed complications were discussed with the                            patient. Return to normal activities tomorrow.                             Written discharge instructions were provided to the                            patient.                           - Resume previous diet. However, any meat consumed                            should always be ground to decrease the chance of                            food impaction.                           - Continue present medications.                           - Await pathology results.                           - Resume prevacid  30 mg once daily (new Rx if                            needed Disp #30, RF 3) Sherrina Zaugg L. Legrand, MD 12/21/2023 9:23:28 AM This report has been signed electronically.

## 2023-12-21 NOTE — Progress Notes (Signed)
 History and Physical:  This patient presents for endoscopic testing for: Encounter Diagnoses  Name Primary?   Eosinophilic esophagitis Yes   Esophageal dysphagia     63 year old man here for evaluation of esophageal dysphagia in the setting of known eosinophilic esophagitis.  Further clinical details in 11/09/2023 office note.  Patient went back on nightly budesonide  slurry after that. Last EGD December 2023 had shown significant improvement in the mucosal findings of EOE while on budesonide  slurry compared to an EGD 2 months prior. Patient is otherwise without complaints or active issues today.   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   Kidney stone on left side 09/19/2013   Left knee pain 12/25/2012   Mumps as a child   Onychomycosis 09/30/2014   Overweight 12/16/2015   Preventative health care 09/24/2013     Past Surgical History: Past Surgical History:  Procedure Laterality Date   COLONOSCOPY     COLONOSCOPY WITH PROPOFOL  N/A 12/04/2019   Procedure: COLONOSCOPY WITH PROPOFOL ;  Surgeon: Wilhelmenia Aloha Raddle., MD;  Location: [facility] ENDOSCOPY;  Service: Gastroenterology;  Laterality: N/A;  ultra slim scope    COLONOSCOPY WITH PROPOFOL  N/A 02/18/2023   Procedure: COLONOSCOPY WITH PROPOFOL ;  Surgeon: Wilhelmenia Aloha Raddle., MD;  Location: [facility] ENDOSCOPY;  Service: Gastroenterology;  Laterality: N/A;  1 hour- abdominal binder/colowrap   CORONARY STENT PLACEMENT  01/14/2005   FLEXIBLE BRONCHOSCOPY W/ UPPER ENDOSCOPY   11/03/2021   LITHOTRIPSY  03/2014   POLYPECTOMY  12/04/2019   Procedure: POLYPECTOMY;  Surgeon: Wilhelmenia Aloha Raddle., MD;  Location: [facility] ENDOSCOPY;  Service: Gastroenterology;;   POLYPECTOMY  02/18/2023   Procedure: POLYPECTOMY;  Surgeon: Wilhelmenia Aloha Raddle., MD;  Location: [facility] ENDOSCOPY;  Service: Gastroenterology;;   UPPER GASTROINTESTINAL ENDOSCOPY  09/12/2019   Danis    Allergies: Allergies  Allergen Reactions   Neosporin [Neomycin-Bacitracin Zn-Polymyx] Hives and Itching   Amoxicillin Itching and Rash   Polysporin [Bacitracin-Polymyxin B] Hives and Itching    Outpatient Meds: Current Outpatient Medications  Medication Sig Dispense Refill   Allopurinol  200 MG TABS Take 2 tablets by mouth daily. 180 tablet 1   aspirin  EC 81 MG tablet Take 1 tablet (81 mg total) by mouth daily. Swallow whole. 90 tablet 3   atorvastatin  (LIPITOR) 80 MG tablet TAKE 1 TABLET DAILY 90 tablet 1   budesonide  (PULMICORT ) 0.5 MG/2ML nebulizer solution Take 8 mLs (2 mg total) by nebulization at bedtime. 720 mL 0   buPROPion  (WELLBUTRIN  XL) 300 MG 24 hr tablet TAKE 1 TABLET DAILY 90 tablet 3   cetirizine (ZYRTEC) 10 MG tablet Take 10 mg by mouth daily as needed for allergies.     ezetimibe  (ZETIA ) 10 MG tablet Take 1 tablet (10 mg total) by mouth daily. 90 tablet 3   indomethacin  (INDOCIN ) 50 MG capsule TAKE 1 CAPSULE TWICE A DAY AS NEEDED 180 capsule 3   lansoprazole  (PREVACID ) 30 MG capsule Take 1 capsule (30 mg total) by  mouth 2 (two) times daily before a meal. 60 capsule 2   losartan  (COZAAR ) 50 MG tablet TAKE 1 TABLET DAILY 90 tablet 3   metoprolol  succinate (TOPROL -XL) 50 MG 24 hr tablet Take 1 tablet (50 mg total) by mouth daily. 30 tablet 0   Multiple Vitamin (MULTIVITAMIN WITH MINERALS) TABS tablet Take 1 tablet by mouth daily.     nystatin  cream (MYCOSTATIN ) Apply 1 Application topically 2 (two) times daily. 30 g 1   zolpidem  (AMBIEN ) 10 MG tablet Take 1 tablet (10 mg total) by mouth at  bedtime. 30 tablet 5   clobetasol  ointment (TEMOVATE ) 0.05 % Apply 1 application topically 2 (two) times daily as needed (psoriasis). 60 g 2   clotrimazole (LOTRIMIN) 1 % cream Apply topically 2 (two) times daily.     desonide  (DESOWEN ) 0.05 % ointment Apply 1 Application topically 2 (two) times daily. 15 g 0   hydrocortisone 2.5 % cream Apply topically 2 (two) times daily.     Melatonin 10 MG CAPS Take 10 mg by mouth at bedtime as needed (sleep).     Meloxicam  15 MG TBDP Take by mouth as needed. (Patient not taking: No sig reported)     methylPREDNISolone  (MEDROL  DOSEPAK) 4 MG TBPK tablet Take as directed per package instructions (Patient not taking: Reported on 12/21/2023) 21 tablet 0   metoCLOPramide  (REGLAN ) 5 MG tablet Take 1 tablet (5 mg total) by mouth once for 1 dose. (Patient not taking: No sig reported) 1 tablet 0   mupirocin  ointment (BACTROBAN ) 2 % Place 1 Application into the nose 2 (two) times daily. (Patient taking differently: Place 1 Application into the nose 2 (two) times daily as needed.) 30 g 1   polyethylene glycol powder (GLYCOLAX /MIRALAX ) 17 GM/SCOOP powder Take 255 g by mouth daily. 255 g 3   Current Facility-Administered Medications  Medication Dose Route Frequency Provider Last Rate Last Admin   0.9 %  sodium chloride  infusion  500 mL Intravenous Once Danis, Victory CROME III, MD          ___________________________________________________________________ Objective   Exam:  BP 108/75   Pulse 60   Temp (!) 97.3 F (36.3 C) (Temporal)   Ht 6' (1.829 m)   Wt 202 lb (91.6 kg)   SpO2 96%   BMI 27.40 kg/m   CV: regular , S1/S2 Resp: clear to auscultation bilaterally, normal RR and effort noted GI: soft, no tenderness, with active bowel sounds.   Assessment: Encounter Diagnoses  Name Primary?   Eosinophilic esophagitis Yes   Esophageal dysphagia      Plan:  EGD with possible dilation  The benefits and risks of the planned procedure(s) were described in  detail with the patient or (when appropriate) their health care proxy.  Risks were outlined as including, but not limited to, bleeding, infection, perforation, adverse medication reaction leading to cardiac or pulmonary decompensation, pancreatitis (if ERCP).  The limitation of incomplete mucosal visualization was also discussed.  No guarantees or warranties were given.  The patient was provided an opportunity to ask questions and all were answered. The patient agreed with the plan.   The patient is appropriate for an endoscopic procedure in the ambulatory setting.   - Victory Brand, MD

## 2023-12-21 NOTE — Progress Notes (Signed)
 Pt's states no medical or surgical changes since previsit or office visit.

## 2023-12-21 NOTE — Progress Notes (Signed)
 To pacu, VSS. Report to Rn.tb

## 2023-12-21 NOTE — Progress Notes (Signed)
 Called to room to assist during endoscopic procedure.  Patient ID and intended procedure confirmed with present staff. Received instructions for my participation in the procedure from the performing physician.

## 2023-12-22 ENCOUNTER — Telehealth: Payer: Self-pay

## 2023-12-22 NOTE — Telephone Encounter (Signed)
  Follow up Call-     12/21/2023    8:02 AM 01/22/2022    7:50 AM 11/05/2021    9:06 AM  Call back number  Post procedure Call Back phone  # [phone]  Permission to leave phone message Yes Yes Yes     Patient questions:  Do you have a fever, pain , or abdominal swelling? No. Pain Score  0 *  Have you tolerated food without any problems? Yes.    Have you been able to return to your normal activities? Yes.    Do you have any questions about your discharge instructions: Diet   No. Medications  No. Follow up visit  No.  Do you have questions or concerns about your Care? Yes.    Actions: * If pain score is 4 or above: No action needed, pain <4.

## 2023-12-22 NOTE — Progress Notes (Signed)
 HPI: FU coronary artery disease. 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 Cath revealed normal LM. Left anterior descending artery had 30% lesion in the proximal and mid-vessel. Lcx with 60 % lesion. There were left-to-right collaterals to the RCA. Right coronary artery was dominant and was 100% midvessel occlusion after the takeoff of a medium-sized RV branch; inferobasal wall akinesis. EF was 50%. He underwent PCI of the right coronary artery with bare-metal stent. EF was 50%. Stress echocardiogram in February 2013 was normal. Carotid Dopplers June 2018 showed 1 to 39% bilateral stenosis.  Since last seen, the patient denies any dyspnea on exertion, orthopnea, PND, pedal edema, palpitations, syncope or chest pain.   Current Outpatient Medications  Medication Sig Dispense Refill   Allopurinol  200 MG TABS Take 2 tablets by mouth daily. 180 tablet 1   aspirin  EC 81 MG tablet Take 1 tablet (81 mg total) by mouth daily. Swallow whole. 90 tablet 3   atorvastatin  (LIPITOR) 80 MG tablet TAKE 1 TABLET DAILY 90 tablet 1   budesonide  (PULMICORT ) 0.5 MG/2ML nebulizer solution Take 8 mLs (2 mg total) by nebulization at bedtime. 720 mL 0   buPROPion  (WELLBUTRIN  XL) 300 MG 24 hr tablet TAKE 1 TABLET DAILY 90 tablet 3   cetirizine (ZYRTEC) 10 MG tablet Take 10 mg by mouth daily as needed for allergies.     clobetasol  ointment (TEMOVATE ) 0.05 % Apply 1 application topically 2 (two) times daily as needed (psoriasis). 60 g 2   clotrimazole (LOTRIMIN) 1 % cream Apply topically 2 (two) times daily.     desonide  (DESOWEN ) 0.05 % ointment Apply 1 Application topically 2 (two) times daily. 15 g 0   ezetimibe  (ZETIA ) 10 MG tablet Take 1 tablet (10 mg total) by mouth daily. 90 tablet 3   hydrocortisone 2.5 % cream Apply topically 2 (two) times daily.     indomethacin  (INDOCIN ) 50 MG capsule TAKE 1 CAPSULE TWICE A DAY AS NEEDED 180 capsule 3   lansoprazole  (PREVACID ) 30 MG capsule Take 1  capsule (30 mg total) by mouth daily at 12 noon. 30 capsule 3   losartan  (COZAAR ) 50 MG tablet TAKE 1 TABLET DAILY 90 tablet 3   Melatonin 10 MG CAPS Take 10 mg by mouth at bedtime as needed (sleep).     Meloxicam  15 MG TBDP Take by mouth as needed.     metoprolol  succinate (TOPROL -XL) 50 MG 24 hr tablet Take 1 tablet (50 mg total) by mouth daily. 30 tablet 0   Multiple Vitamin (MULTIVITAMIN WITH MINERALS) TABS tablet Take 1 tablet by mouth daily.     mupirocin  ointment (BACTROBAN ) 2 % Place 1 Application into the nose 2 (two) times daily. (Patient taking differently: Place 1 Application into the nose 2 (two) times daily as needed.) 30 g 1   nystatin  cream (MYCOSTATIN ) Apply 1 Application topically 2 (two) times daily. 30 g 1   zolpidem  (AMBIEN ) 10 MG tablet Take 1 tablet (10 mg total) by mouth at bedtime. 30 tablet 5   methylPREDNISolone  (MEDROL  DOSEPAK) 4 MG TBPK tablet Take as directed per package instructions (Patient not taking: Reported on 12/29/2023) 21 tablet 0   metoCLOPramide  (REGLAN ) 5 MG tablet Take 1 tablet (5 mg total) by mouth once for 1 dose. (Patient not taking: Reported on 12/29/2023) 1 tablet 0   No current facility-administered medications for this visit.     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   Kidney stone on left side 09/19/2013   Left knee pain 12/25/2012   Mumps as a child   Onychomycosis 09/30/2014   Overweight 12/16/2015   Preventative health care 09/24/2013    Past Surgical History:  Procedure Laterality  Date   COLONOSCOPY     COLONOSCOPY WITH PROPOFOL  N/A 12/04/2019   Procedure: COLONOSCOPY WITH PROPOFOL ;  Surgeon: [name], Aloha Raddle., MD;  Location: [facility] ENDOSCOPY;  Service: Gastroenterology;  Laterality: N/A;  ultra slim scope    COLONOSCOPY WITH PROPOFOL  N/A 02/18/2023   Procedure: COLONOSCOPY WITH PROPOFOL ;  Surgeon: [name], Aloha Raddle., MD;  Location: [facility] ENDOSCOPY;  Service: Gastroenterology;  Laterality: N/A;  1 hour- abdominal binder/colowrap   CORONARY STENT PLACEMENT  01/14/2005   FLEXIBLE BRONCHOSCOPY W/ UPPER ENDOSCOPY  11/03/2021   LITHOTRIPSY  03/2014   POLYPECTOMY  12/04/2019   Procedure: POLYPECTOMY;  Surgeon: [name], Aloha Raddle., MD;  Location: [facility] ENDOSCOPY;  Service: Gastroenterology;;   POLYPECTOMY  02/18/2023   Procedure: POLYPECTOMY;  Surgeon: Wilhelmenia Aloha Raddle., MD;  Location: [facility] ENDOSCOPY;  Service: Gastroenterology;;   UPPER GASTROINTESTINAL ENDOSCOPY  09/12/2019   Danis    Social History   Socioeconomic History   Marital status: Married    Spouse name: Not on file   Number of children: 2   Years of education: Not on file   Highest education level: Bachelor's degree (e.g., BA, AB, BS)  Occupational History   Occupation: technical sales engineer  Tobacco Use   Smoking status: Former    Current packs/day: 0.00    Average packs/day: 0.5 packs/day for 20.0 years (10.0 ttl pk-yrs)    Types: Cigarettes, E-cigarettes    Start date: 02/20/1995    Quit date: 02/20/2015    Years since quitting: 8.8   Smokeless tobacco: Never   Tobacco comments:    Quit cigarettes on 02/20/2015  Vaping Use   Vaping status: Former  Substance and Sexual Activity   Alcohol use: Yes    Alcohol/week: 8.0 standard drinks of alcohol    Types: 4 Glasses of wine, 2 Cans of beer, 2 Shots of liquor per week    Comment: 2-3 per week   Drug use: No   Sexual activity: Not Currently    Partners: Female    Birth control/protection: None    Comment: wife, real psychologist, occupational, no  dairy, minimal fried foods  Other Topics Concern   Not on file  Social History Narrative   1 biological son   1 adopted child   3 step children   Social Drivers of Corporate Investment Banker Strain: Low Risk  (09/15/2023)   Overall Financial Resource Strain (CARDIA)    Difficulty of Paying Living Expenses: Not hard at all  Food Insecurity: No Food Insecurity (09/15/2023)   Hunger Vital Sign    Worried About Running Out of Food in the Last Year: Never true    Ran Out of Food in the Last Year: Never true  Transportation Needs: No Transportation Needs (09/15/2023)   PRAPARE -  Administrator, Civil Service (Medical): No    Lack of Transportation (Non-Medical): No  Physical Activity: Sufficiently Active (09/15/2023)   Exercise Vital Sign    Days of Exercise per Week: 5 days    Minutes of Exercise per Session: 60 min  Stress: Stress Concern Present (09/15/2023)   [employer] of Occupational Health - Occupational Stress Questionnaire    Feeling of Stress: To some extent  Social Connections: Moderately Integrated (09/15/2023)   Social Connection and Isolation Panel    Frequency of Communication with Friends and Family: Twice a week    Frequency of Social Gatherings with Friends and Family: Twice a week    Attends Religious Services: More than 4 times per year    Active Member of [employer] or Organizations: No    Attends Engineer, Structural: Not on file    Marital Status: Married  Intimate Partner Violence: Unknown (05/09/2021)   Received from [facility]   HITS    Physically Hurt: Not on file    Insult or Talk Down To: Not on file    Threaten Physical Harm: Not on file    Scream or Curse: Not on file    Family History  Problem Relation Age of Onset   Multiple sclerosis Mother    Cancer Father        esophagus/ brain   Alcohol abuse Father        smoker   Esophageal cancer Father    Tremor Father    Seizures Sister        AVM   COPD Sister    Alcohol  abuse Sister        addiction, recovered   Tremor Son    Cancer Paternal Aunt        leukemia   Heart disease Maternal Grandfather        MI   Heart disease Maternal Uncle    Colon cancer Neg Hx    Colon polyps Neg Hx    Rectal cancer Neg Hx    Stomach cancer Neg Hx     ROS: no fevers or chills, productive cough, hemoptysis, dysphasia, odynophagia, melena, hematochezia, dysuria, hematuria, rash, seizure activity, orthopnea, PND, pedal edema, claudication. Remaining systems are negative.  Physical Exam: Well-developed well-nourished in no acute distress.  Skin is warm and dry.  HEENT is normal.  Neck is supple.  Chest is clear to auscultation with normal expansion.  Cardiovascular exam is regular rate and rhythm.  Abdominal exam nontender or distended. No masses palpated. Extremities show no edema. neuro grossly intact  ECG- personally reviewed  A/P  1 coronary artery disease-plan to continue medical therapy with aspirin  and statin.  He is not having chest pain.  2 hyperlipidemia-continue Lipitor and Zetia .  3 hypertension-patient's blood pressure is controlled today.  Continue present medications.  4 carotid artery disease-mild on previous Dopplers.  Redell Shallow, MD

## 2023-12-23 LAB — SURGICAL PATHOLOGY

## 2023-12-24 ENCOUNTER — Ambulatory Visit: Payer: Self-pay | Admitting: Gastroenterology

## 2023-12-29 ENCOUNTER — Ambulatory Visit: Attending: Cardiology | Admitting: Cardiology

## 2023-12-29 ENCOUNTER — Encounter: Payer: Self-pay | Admitting: Cardiology

## 2023-12-29 VITALS — BP 135/87 | HR 61 | Ht 72.0 in | Wt 200.0 lb

## 2023-12-29 DIAGNOSIS — I10 Essential (primary) hypertension: Secondary | ICD-10-CM | POA: Diagnosis not present

## 2023-12-29 DIAGNOSIS — E7800 Pure hypercholesterolemia, unspecified: Secondary | ICD-10-CM

## 2023-12-29 DIAGNOSIS — I679 Cerebrovascular disease, unspecified: Secondary | ICD-10-CM | POA: Diagnosis not present

## 2023-12-29 DIAGNOSIS — I2510 Atherosclerotic heart disease of native coronary artery without angina pectoris: Secondary | ICD-10-CM

## 2023-12-29 MED ORDER — METOPROLOL SUCCINATE ER 50 MG PO TB24: 50.0000 mg | ORAL_TABLET | Freq: Every day | ORAL | 3 refills | Status: DC

## 2023-12-29 MED ORDER — LOSARTAN POTASSIUM 50 MG PO TABS: 50.0000 mg | ORAL_TABLET | Freq: Every day | ORAL | 3 refills | Status: AC

## 2023-12-29 MED ORDER — EZETIMIBE 10 MG PO TABS: 10.0000 mg | ORAL_TABLET | Freq: Every day | ORAL | 3 refills | Status: AC

## 2023-12-29 MED ORDER — ATORVASTATIN CALCIUM 80 MG PO TABS: 80.0000 mg | ORAL_TABLET | Freq: Every day | ORAL | 3 refills | Status: AC

## 2023-12-29 NOTE — Patient Instructions (Signed)

## 2024-01-03 ENCOUNTER — Other Ambulatory Visit: Payer: Self-pay | Admitting: Cardiology

## 2024-02-14 ENCOUNTER — Ambulatory Visit: Admitting: Gastroenterology

## 2024-02-14 ENCOUNTER — Encounter: Payer: Self-pay | Admitting: Gastroenterology

## 2024-02-14 VITALS — BP 140/76 | HR 70 | Ht 72.0 in | Wt 202.1 lb

## 2024-02-14 DIAGNOSIS — K2100 Gastro-esophageal reflux disease with esophagitis, without bleeding: Secondary | ICD-10-CM

## 2024-02-14 DIAGNOSIS — R1310 Dysphagia, unspecified: Secondary | ICD-10-CM | POA: Diagnosis not present

## 2024-02-14 DIAGNOSIS — K200 Eosinophilic esophagitis: Secondary | ICD-10-CM

## 2024-02-14 DIAGNOSIS — K222 Esophageal obstruction: Secondary | ICD-10-CM

## 2024-02-14 DIAGNOSIS — R1319 Other dysphagia: Secondary | ICD-10-CM

## 2024-02-14 NOTE — Patient Instructions (Addendum)
 _______________________________________________________  If your blood pressure at your visit was 140/90 or greater, please contact your primary care physician to follow up on this.  _______________________________________________________  If you are age 64 or older, your body mass index should be between 23-30. Your Body mass index is 27.41 kg/m. If this is out of the aforementioned range listed, please consider follow up with your Primary Care Provider.  If you are age 22 or younger, your body mass index should be between 19-25. Your Body mass index is 27.41 kg/m. If this is out of the aformentioned range listed, please consider follow up with your Primary Care Provider.   ________________________________________________________  The [facility] providers would like to encourage you to use MYCHART to communicate with providers for non-urgent requests or questions.  Due to long hold times on the telephone, sending your provider a message by [facility] may be a faster and more efficient way to get a response.  Please allow 48 business hours for a response.  Please remember that this is for non-urgent requests.  _______________________________________________________  [facility] is using a team-based approach to care.  Your team is made up of your doctor and two to three APPS. Our APPS (Nurse Practitioners and Physician Assistants) work with your physician to ensure care continuity for you. They are fully qualified to address your health concerns and develop a treatment plan. They communicate directly with your gastroenterologist to care for you. Seeing the Advanced Practice Practitioners on your physician's team can help you by facilitating care more promptly, often allowing for earlier appointments, access to diagnostic testing, procedures, and other specialty referrals.    Thank you for trusting me with your gastrointestinal care!    Dr. Victory Legrand DOUGLAS [facility]    __________________________________    Info about Dupixent for EoE  Patient Information: Dupixent (Dupilumab) for Eosinophilic Esophagitis  What is Dupixent?   Dupixent (dupilumab) is an injectable medication approved by the FDA to treat eosinophilic esophagitis (EoE), a chronic inflammatory condition of the esophagus. It is a monoclonal antibody that works by blocking two proteins in your immune system--interleukin-4 (IL-4) and interleukin-13 (IL-13)--that cause inflammation in EoE.[1]  How Does Dupixent Work?   Dupixent targets the underlying inflammation that causes EoE. By blocking IL-4 and IL-13, it reduces the number of eosinophils (a type of white blood cell) in your esophagus and helps improve swallowing difficulties.[1][2] In clinical trials, about 60% of patients achieved normal or near-normal eosinophil levels after 24 weeks of treatment.[2]  Dosing Information   For Adults and Adolescents (12 years and older, weighing at least 40 kg/88 lbs):  - 300 mg injected under the skin once weekly[1]  - The medication comes as a pre-filled syringe or pen that you inject yourself at home  For Children (42-58 years old, weighing at least 15 kg/33 lbs):  - Dosing is based on body weight and given every other week[1]  - Your doctor will determine the correct dose for your child  The medication is given as a subcutaneous injection (under the skin), typically in the thigh or abdomen. Your healthcare provider will teach you or your caregiver how to give the injections properly.  What to Expect   Most patients begin to see improvement in swallowing symptoms within several weeks of starting treatment. In clinical studies, patients experienced significant reductions in difficulty swallowing and food getting stuck.[2] The benefits are maintained with continued weekly treatment.[1][3]  Common Side Effects   The most common side effects of Dupixent for  EoE include:[1][2]  -  Injection site reactions (redness, pain, swelling, or discomfort where you inject)  - Nasopharyngitis (cold-like symptoms)  - Headache  - Acne or rash  These side effects are generally mild and often improve over time. Injection site reactions occurred in about 17-22% of patients in clinical trials.[2]  Important Safety Information   Good news about infections: Unlike some other medications, Dupixent does not appear to increase your risk of infections. You do not need screening tests for tuberculosis, HIV, or hepatitis before starting this medication.[1]  Conjunctivitis (eye inflammation): This occurs rarely in EoE patients (less than 5% in studies). Contact your doctor if you develop eye redness, itching, or discharge.[2]  Serious side effects: Serious adverse events were uncommon in clinical trials. None of the serious events were considered related to the medication.[2]    References ACG Clinical Guideline: Diagnosis and Management of Eosinophilic Esophagitis. Dellon ES, Wyatt FRIDAY, Katzka DA, et al. The American Journal of Gastroenterology. 2025;120(1):31-59. doi:10.14309/ajg.0000000000003194. Dupilumab in Adults and Adolescents with Eosinophilic Esophagitis. Dellon ES, Jacinta ION, Collins MH, et al. The New England Journal of Medicine. 2022;387(25):2317-2330. doi:10.1056/NEJMoa2205982. Dupilumab Demonstrated Efficacy and Was Well Tolerated Regardless of Prior Use of Swallowed Topical Corticosteroids in Adolescent and Adult Patients With Eosinophilic Oesophagitis: A Subgroup Analysis of the Phase 3 LIBERTY EoE TREET Study. Bredenoord AJ, Dellon ES, Shelly FERNS, et al. Gut. 2024;73(3):398-406. doi:10.1136/gutjnl-[id number].

## 2024-02-14 NOTE — Progress Notes (Signed)
 "     [facility] Progress Note  Chief Complaint: EOE and GERD  Subjective  Prior history  History of colon polyps, sigmoid tortuosity and stricturing with challenging scope passage.  Subsequent colonoscopies with Dr. Wilhelmenia, most recently January 2025   Eosinophilic esophagitis,  office visit January 2024 after December 2023 EGD showed significant improvement in mucosal appearance and reduction in eosinophilic infiltration on esophageal mucosal biopsies while on oral budesonide  slurry.  Patient was continuing the treatment at that time. 11/09/2023 office visit with return of dysphagia about 6 months off budesonide .  Budesonide  slurry resumed 2 mg nightly but off PPI, then EGD 12/21/2023 on those treatments showed changes of EOE and GERD along with EG junction stricture-wire-guided dilation to 16 mm.  Prevacid  30 mg once daily added.   pathology showed both conditions but preponderance of EOE.   Discussed the use of AI scribe software for clinical note transcription with the patient, who gave verbal consent to proceed.  History of Present Illness Dakota [name] is a 64 year old male with eosinophilic esophagitis, esophageal stricture, and gastroesophageal reflux disease who presents for follow-up of esophageal symptoms and management options.  Dysphagia and Esophageal Stricture: - History of esophageal stricture requiring dilation, most recently in mid-November - Concern about recurrence of stricture, referencing prior narrowing at the gastroesophageal junction - Since last dilation, no episodes of food impaction - Swallowing has been adequate; no current dysphagia - Remains cautious with meals: eats slowly, selects food carefully, uses plenty of liquids, and often finishes meals last  Eosinophilic Esophagitis and Reflux Symptoms: - Prior endoscopy and biopsy consistent with eosinophilic esophagitis and gastroesophageal reflux - No current symptoms of food impaction or significant  dysphagia - Alternates between budesonide  slurry (resumed for 5-6 weeks after last dilation) and prescription lansoprazole  30 mg (not taken daily) - Concerned about overmedication and prefers a simplified medication regimen  Therapeutic Considerations: - Previously discussed dupilumab as a potential treatment option - Interested in learning more about dupilumab, including its potential benefits for other inflammatory conditions and concerns about cost  Comorbid Inflammatory Conditions: - History of psoriatic arthritis, psoriasis, and eczema - Interested in whether dupilumab may provide benefit for these conditions as well    ROS: Cardiovascular:  no chest pain Respiratory: no dyspnea  The patient's Past Medical, Family and Social History were reviewed and are on file in the EMR. 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   Kidney stone on left side 09/19/2013   Left knee pain 12/25/2012   Mumps as a child   Onychomycosis 09/30/2014   Overweight 12/16/2015   Preventative health care 09/24/2013    Past Surgical History:  Procedure Laterality Date   COLONOSCOPY     COLONOSCOPY WITH PROPOFOL  N/A 12/04/2019   Procedure: COLONOSCOPY WITH PROPOFOL ;  Surgeon: Wilhelmenia Aloha Raddle., MD;  Location[facility] ENDOSCOPY;  Service: Gastroenterology;  Laterality: N/A;  ultra slim scope    COLONOSCOPY WITH PROPOFOL  N/A  02/18/2023   Procedure: COLONOSCOPY WITH PROPOFOL ;  Surgeon: Wilhelmenia Aloha Raddle., MD;  Location: [facility];  Service:  Gastroenterology;  Laterality: N/A;  1 hour- abdominal binder/colowrap   CORONARY STENT PLACEMENT  01/14/2005   ESOPHAGOGASTRODUODENOSCOPY  2025   FLEXIBLE BRONCHOSCOPY W/ UPPER ENDOSCOPY  11/03/2021   LITHOTRIPSY  03/2014   POLYPECTOMY  12/04/2019   Procedure: POLYPECTOMY;  Surgeon: [name], Aloha Raddle., MD;  Location: [facility] ENDOSCOPY;  Service: Gastroenterology;;   POLYPECTOMY  02/18/2023   Procedure: POLYPECTOMY;  Surgeon: Wilhelmenia Aloha Raddle., MD;  Location: [facility] ENDOSCOPY;  Service: Gastroenterology;;   UPPER GASTROINTESTINAL ENDOSCOPY  09/12/2019   Danis     Objective:  Med list reviewed Current Medications[1]   Vital signs in last 24 hrs: Vitals:   02/14/24 0927  BP: (!) 140/76  Pulse: 70   Wt Readings from Last 3 Encounters:  02/14/24 202 lb 2 oz (91.7 kg)  12/29/23 200 lb (90.7 kg)  12/21/23 202 lb (91.6 kg)    Physical Exam  Well-appearing.  No additional exam.  Entire visit spent in review of results symptoms and plan   Labs:   ___________________________________________ Radiologic studies:   ____________________________________________ Other:  1. Surgical [P], distal esophagus :      -  SQUAMOUS MUCOSA WITH BASAL CELL HYPERPLASIA, PAPILLARY ELONGATION, SPONGIOSIS      AND INCREASED INTRAEPITHELIAL EOSINOPHILS (TOO NUMEROUS TO COUNT), SEE NOTE.       2. Surgical [P], mid esophagus :      -  SQUAMOUS MUCOSA WITH BASAL CELL HYPERPLASIA, PAPILLARY ELONGATION,      SPONGIOSIS, FOCAL DYSKERATOSIS AND INCREASED INTRAEPITHELIAL EOSINOPHILS      (FOCALLY TOO NUMEROUS TO COUNT), SEE NOTE.       3. Surgical [P], prox esophagus :      -  SQUAMOUS MUCOSA WITH MILD BASAL CELL HYPERPLASIA, MILD PAPILLARY ELONGATION      AND FOCAL MILDLY INCREASED INTRAEPITHELIAL LYMPHOCYTES, SEE NOTE.      NOTE: THE FINDINGS IN THE DISTAL ESOPHAGUS ARE THE MOST PROMINENT OF THE 3      BIOPSIES; HOWEVER, THEY ARE STILL QUITE PROMINENT IN THE MIDESOPHAGUS, BUT THE       INTRAEPITHELIAL EOSINOPHILS ARE NOT READILY IDENTIFIED IN THE PROXIMAL, BUT SOME      NONSPECIFIC INTRAEPITHELIAL LYMPHOCYTES ARE PRESENT.  THE OVERALL FINDINGS      SUGGEST POSSIBLE EOSINOPHILIC ESOPHAGITIS; HOWEVER, REFLUX ESOPHAGITIS ALSO      REMAINS WITHIN THE DIFFERENTIAL DIAGNOSIS.  CLINICAL/ENDOSCOPIC CORRELATION      RECOMMENDED.  _____________________________________________   Encounter Diagnoses  Name Primary?   Eosinophilic esophagitis Yes   Esophageal dysphagia    Esophageal stricture    Gastroesophageal reflux disease with esophagitis without hemorrhage     Assessment & Plan Eosinophilic esophagitis Chronic eosinophilic esophagitis, well-managed on budesonide  and Prevacid . Discussed dupilumab as potential long-term therapy for improved control and reduced budesonide  need. - Continue alternating budesonide  slurry and Prevacid . - Provided detailed information about dupilumab, including risks, benefits, administration, dosing, and side effects. - Advised review of dupilumab information and insurance coverage before decision-making. - Offered ongoing communication for further questions and decision-making. - If dupilumab chosen, arrange self-injection teaching through drug company resources.  Gastroesophageal reflux disease Chronic GERD with prior erosions, managed with Prevacid . Antacid therapy necessary to prevent inflammation and stricture, even with potential dupilumab initiation. - Continue prescription-dose Prevacid , alternating with budesonide . - Discussed rationale for Prevacid  due to reflux and erosions. - Advised antacid therapy likely necessary even with dupilumab, though dose  may reduce if inflammation improves. - Provided guidance on risks of untreated GERD, including inflammation and stricture.  Esophageal stricture Recurrent stricture risk at gastroesophageal junction, asymptomatic but high risk without ongoing therapy. - Continue current medical therapy to  reduce stricture risk. - Discussed risks of untreated eosinophilic esophagitis and GERD leading to strictures. - Advised caution with food choices and maintain hydration during meals. - If symptoms recur or therapy changes, contact for evaluation and possible endoscopy.    I spent total of 25 minutes in both face-to-face (20 minutes interview/exam) and non-face-to-face (5 minutes chart review, care coordination, documentation)  activities, excluding procedures performed, for the visit on the date of this encounter.   Victory LITTIE Brand III     [1]  Current Outpatient Medications:    Allopurinol  200 MG TABS, Take 2 tablets by mouth daily., Disp: 180 tablet, Rfl: 1   aspirin  EC 81 MG tablet, Take 1 tablet (81 mg total) by mouth daily. Swallow whole., Disp: 90 tablet, Rfl: 3   atorvastatin  (LIPITOR) 80 MG tablet, Take 1 tablet (80 mg total) by mouth daily., Disp: 90 tablet, Rfl: 3   budesonide  (PULMICORT ) 0.5 MG/2ML nebulizer solution, Take 8 mLs (2 mg total) by nebulization at bedtime., Disp: 720 mL, Rfl: 0   buPROPion  (WELLBUTRIN  XL) 300 MG 24 hr tablet, TAKE 1 TABLET DAILY, Disp: 90 tablet, Rfl: 3   cetirizine (ZYRTEC) 10 MG tablet, Take 10 mg by mouth daily as needed for allergies., Disp: , Rfl:    clobetasol  ointment (TEMOVATE ) 0.05 %, Apply 1 application topically 2 (two) times daily as needed (psoriasis)., Disp: 60 g, Rfl: 2   clotrimazole (LOTRIMIN) 1 % cream, Apply topically 2 (two) times daily., Disp: , Rfl:    desonide  (DESOWEN ) 0.05 % ointment, Apply 1 Application topically 2 (two) times daily., Disp: 15 g, Rfl: 0   ezetimibe  (ZETIA ) 10 MG tablet, Take 1 tablet (10 mg total) by mouth daily., Disp: 90 tablet, Rfl: 3   hydrocortisone 2.5 % cream, Apply topically 2 (two) times daily., Disp: , Rfl:    indomethacin  (INDOCIN ) 50 MG capsule, TAKE 1 CAPSULE TWICE A DAY AS NEEDED, Disp: 180 capsule, Rfl: 3   lansoprazole  (PREVACID ) 30 MG capsule, Take 1 capsule (30 mg total) by mouth daily at  12 noon., Disp: 30 capsule, Rfl: 3   losartan  (COZAAR ) 50 MG tablet, Take 1 tablet (50 mg total) by mouth daily., Disp: 90 tablet, Rfl: 3   Melatonin 10 MG CAPS, Take 10 mg by mouth at bedtime as needed (sleep)., Disp: , Rfl:    Meloxicam  15 MG TBDP, Take by mouth as needed., Disp: , Rfl:    metoprolol  succinate (TOPROL -XL) 50 MG 24 hr tablet, TAKE 1 TABLET(50 MG) BY MOUTH DAILY, Disp: 90 tablet, Rfl: 3   Multiple Vitamin (MULTIVITAMIN WITH MINERALS) TABS tablet, Take 1 tablet by mouth daily., Disp: , Rfl:    mupirocin  ointment (BACTROBAN ) 2 %, Place 1 Application into the nose 2 (two) times daily. (Patient taking differently: Place 1 Application into the nose 2 (two) times daily as needed.), Disp: 30 g, Rfl: 1   nystatin  cream (MYCOSTATIN ), Apply 1 Application topically 2 (two) times daily., Disp: 30 g, Rfl: 1   zolpidem  (AMBIEN ) 10 MG tablet, Take 1 tablet (10 mg total) by mouth at bedtime., Disp: 30 tablet, Rfl: 5   methylPREDNISolone  (MEDROL  DOSEPAK) 4 MG TBPK tablet, Take as directed per package instructions (Patient not taking: Reported on 02/14/2024), Disp: 21 tablet, Rfl: 0  metoCLOPramide  (REGLAN ) 5 MG tablet, Take 1 tablet (5 mg total) by mouth once for 1 dose. (Patient not taking: Reported on 02/14/2024), Disp: 1 tablet, Rfl: 0  "

## 2024-03-07 ENCOUNTER — Encounter: Payer: Self-pay | Admitting: Gastroenterology

## 2024-03-08 NOTE — Telephone Encounter (Signed)
 Dr Legrand ok to send Dupixent  to the pharmacy?

## 2024-03-09 MED ORDER — DUPIXENT 300 MG/2ML ~~LOC~~ SOAJ: 300.0000 mg | SUBCUTANEOUS | 6 refills | Status: AC

## 2024-03-09 NOTE — Addendum Note (Signed)
 Addended by: ANITRA ODETTA CROME on: 03/09/2024 08:13 AM   Modules accepted: Orders

## 2024-03-09 NOTE — Telephone Encounter (Signed)
 Yes, as it sounds like that may be the only way for this patient to know the cost. Unfortunate that his insurance company will make us  go through this time and trouble to get the pricing.  The dose is 300 mg subcutaneous injection once a week.  Indication is Eosinophilic Esophagitis with esophageal stricture and dysphagia, persistent despite PPI and oral budesonide .  Thank you,  H. Danis

## 2024-03-23 ENCOUNTER — Ambulatory Visit: Admitting: Family Medicine

## 2024-03-29 ENCOUNTER — Ambulatory Visit: Admitting: Student
